# Patient Record
Sex: Male | Born: 1980
Health system: Southern US, Community
[De-identification: ages and names within clinical notes are randomized; demographics above are authoritative.]

## PROBLEM LIST (undated history)

## (undated) DIAGNOSIS — F1010 Alcohol abuse, uncomplicated: Secondary | ICD-10-CM

## (undated) DIAGNOSIS — F329 Major depressive disorder, single episode, unspecified: Secondary | ICD-10-CM

## (undated) DIAGNOSIS — K709 Alcoholic liver disease, unspecified: Secondary | ICD-10-CM

## (undated) DIAGNOSIS — K760 Fatty (change of) liver, not elsewhere classified: Secondary | ICD-10-CM

## (undated) DIAGNOSIS — R569 Unspecified convulsions: Secondary | ICD-10-CM

## (undated) DIAGNOSIS — F1910 Other psychoactive substance abuse, uncomplicated: Secondary | ICD-10-CM

## (undated) DIAGNOSIS — F32A Depression, unspecified: Secondary | ICD-10-CM

## (undated) DIAGNOSIS — K860 Alcohol-induced chronic pancreatitis: Secondary | ICD-10-CM

## (undated) DIAGNOSIS — E876 Hypokalemia: Secondary | ICD-10-CM

## (undated) DIAGNOSIS — F909 Attention-deficit hyperactivity disorder, unspecified type: Secondary | ICD-10-CM

## (undated) DIAGNOSIS — F419 Anxiety disorder, unspecified: Secondary | ICD-10-CM

## (undated) DIAGNOSIS — D649 Anemia, unspecified: Secondary | ICD-10-CM

## (undated) DIAGNOSIS — E871 Hypo-osmolality and hyponatremia: Secondary | ICD-10-CM

## (undated) DIAGNOSIS — G5620 Lesion of ulnar nerve, unspecified upper limb: Secondary | ICD-10-CM

## (undated) HISTORY — DX: Alcohol abuse, uncomplicated: F10.10

## (undated) HISTORY — DX: Anxiety disorder, unspecified: F41.9

## (undated) HISTORY — DX: Hypo-osmolality and hyponatremia: E87.1

## (undated) HISTORY — PX: WRIST SURGERY: SHX841

## (undated) HISTORY — DX: Unspecified convulsions: R56.9

## (undated) HISTORY — DX: Fatty (change of) liver, not elsewhere classified: K76.0

## (undated) HISTORY — DX: Attention-deficit hyperactivity disorder, unspecified type: F90.9

## (undated) HISTORY — DX: Alcohol-induced chronic pancreatitis: K86.0

## (undated) HISTORY — DX: Lesion of ulnar nerve, unspecified upper limb: G56.20

## (undated) HISTORY — DX: Alcoholic liver disease, unspecified: K70.9

## (undated) HISTORY — DX: Hypokalemia: E87.6

## (undated) HISTORY — DX: Other psychoactive substance abuse, uncomplicated: F19.10

---

## 2004-03-28 ENCOUNTER — Emergency Department (HOSPITAL_COMMUNITY): Admission: EM | Admit: 2004-03-28 | Discharge: 2004-03-28 | Payer: Self-pay | Admitting: Emergency Medicine

## 2014-10-05 ENCOUNTER — Telehealth: Payer: Self-pay | Admitting: Medical

## 2014-10-05 ENCOUNTER — Encounter: Payer: Self-pay | Admitting: Medical

## 2014-10-05 ENCOUNTER — Ambulatory Visit (INDEPENDENT_AMBULATORY_CARE_PROVIDER_SITE_OTHER): Payer: BLUE CROSS/BLUE SHIELD | Admitting: Medical

## 2014-10-05 VITALS — BP 144/85 | HR 108 | Temp 98.4°F | Ht 69.0 in | Wt 147.0 lb

## 2014-10-05 DIAGNOSIS — R55 Syncope and collapse: Secondary | ICD-10-CM

## 2014-10-05 DIAGNOSIS — F411 Generalized anxiety disorder: Secondary | ICD-10-CM | POA: Diagnosis not present

## 2014-10-05 DIAGNOSIS — R569 Unspecified convulsions: Secondary | ICD-10-CM | POA: Diagnosis not present

## 2014-10-05 NOTE — Progress Notes (Signed)
Subjective:    Patient ID: Tony Jennings, male    DOB: [birth year], 34 y.o.   MRN: [id number]  HPI  I have reviewed pt PMH, PSH, FH, Social History and Surgical History  Anxiety- Pt states he has dealt with anxiety since 34 yo. He states he tried medications in the past but he felt like it did not help. Worse when he was younger.then seemed to decrease for a while. But then recently with job lay off in September 9th, move to new city and new job the anxiety is now worse. When younger he states he would have panic attacks. Now he states he occasionally gets panic attacks.  Moderate to severe anxiety  for past months. 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 Pt states his boss makes him very stressed. Pt tried paxil in past. But others meds he can't remember. Then Pt states  remembers taking effexor and he did not like way it made him feel.  Each night he is recenlty drinking beer to help with anwxety. 1-5 beers most night. But occasional none.  Pt states no other type meds that he took. In general he states he does like to take medications. No benzos before.  Pt also states 2 months ago he had a seizure on the couch. Pt had eeg, mri and maybe CT scan. All those came back negative. Pt never followed up with neurologist after hospitalization. No hx of seizure before.  Pt states syncope last week. He ws just walking and passed out just outside his house. He does not know how long he was out. He was not evaluated that day. No reccurence. This never happened before. Not more stressed than normal. But super anxious. No chest pain, no shortness of breath or ha before this event.     Review of Systems  Constitutional: Negative for fever, chills and fatigue.  Respiratory: Negative for cough, chest tightness, shortness of breath and wheezing.   Cardiovascular: Negative for chest pain and palpitations.  Musculoskeletal: Negative for back pain.  Neurological: Positive  for seizures and syncope. Negative for dizziness, tremors, speech difficulty, weakness, light-headedness, numbness and headaches.       None + recently this refers to pmh. Seizure 2 months ago. Syncope wk ago.   Past Medical History  Diagnosis Date  . Seizures   . Anxiety     History   Social History  . Marital Status: Single    Spouse Name: N/A  . Number of Children: N/A  . Years of Education: N/A   Occupational History  . Not on file.   Social History Main Topics  . Smoking status: Current Some Day Smoker -- 3.00 packs/day  . Smokeless tobacco: Never Used  . Alcohol Use: 0.0 oz/week    0 Standard drinks or equivalent per week     Comment: Drank heavily for years in past.  Recenlty drinking more with anxiety.   . Drug Use: No     Comment: prior use marijuana, snorted ritalin, rare cocain. (But this was when 34 yo)  . Sexual Activity: Yes   Other Topics Concern  . Not on file   Social History Narrative  . No narrative on file    Past Surgical History  Procedure Laterality Date  . Wrist surgery      Family History  Problem Relation Age of Onset  . Hypertension Mother   . Hypertension Father     No Known Allergies  No current outpatient prescriptions on file prior to visit.   No current facility-administered medications on file prior to visit.    BP 144/85 mmHg  Pulse 108  Temp(Src) 98.4 F (36.9 C) (Oral)  Ht  (1.753 m)  Wt 147 lb (66.679 kg)  BMI 21.70 kg/m2  SpO2 100%      Objective:   Physical Exam  General Mental Status- Alert. General Appearance- Not in acute distress. But he appears very nervous shaky and paces during interview. He was polite/pleasant despite that he felt anxious  Skin General: Color- Normal Color. Moisture- Normal Moisture.  Neck Carotid Arteries- Normal color. Moisture- Normal Moisture. No carotid bruits. No JVD.  Chest and Lung Exam Auscultation: Breath Sounds:-Normal. Even and  unlabored.  Cardiovascular Auscultation:Rythm- RRR Murmurs & Other Heart Sounds:Auscultation of the heart reveals- No Murmurs.  Abdomen Inspection:-Inspeection Normal. Palpation/Percussion:Note:No mass. Palpation and Percussion of the abdomen reveal- Non Tender, Non Distended + BS, no rebound or guarding.    Neurologic Cranial Nerve exam:- CN III-XII intact(No nystagmus), symmetric smile.  Finger to Nose:- Normal/Intact Strength:- 5/5 equal and symmetric strength both upper and lower extremities.      Assessment & Plan:

## 2014-10-05 NOTE — Progress Notes (Signed)
Pre visit review using our clinic review tool, if applicable. No additional management support is needed unless otherwise documented below in the visit note. 

## 2014-10-05 NOTE — Assessment & Plan Note (Signed)
This occurred one time 2 months ago. You did not follow up since [place] in [place]. I wil refer you to local neurologist for follow up. Please sign release of info forms so we can get old records.

## 2014-10-05 NOTE — Assessment & Plan Note (Signed)
With your severe anxiety I do want to offer you some level of relief. You are hesitant to take any medication. The med that I think will help with anxiety are hydroxyzine and sertraline. You want to investigate/research both. Please let me know if you are willing to try one or both.  I am giving you card of our Counselor. Please call this number and set up an appointment.

## 2014-10-05 NOTE — Telephone Encounter (Signed)
emmi mailed  °

## 2014-10-05 NOTE — Patient Instructions (Signed)
Generalized anxiety disorder With your severe anxiety I do want to offer you some level of relief. You are hesitant to take any medication. The med that I think will help with anxiety are hydroxyzine and sertraline. You want to investigate/research both. Please let me know if you are willing to try one or both.  I am giving you card of our Counselor. Please call this number and set up an appointment.   Syncope With your hx of syncope one week ago. We did ekg today. I also want to refer you to cardiologist and neurologist to evaluate potential causes.   If this were to occur again then ED evaluation that same day.  Also I wanted to get some baseline labs today but your time/work schedule did not permit. We can get these labs on follow up.   Seizures This occurred one time 2 months ago. You did not follow up since you are in [place]. I wil refer you to local neurologist for follow up. Please sign release of info forms so we can get old records.     Follow up in 2 weeks or as needed.

## 2014-10-05 NOTE — Assessment & Plan Note (Addendum)
With your hx of syncope one week ago. We did ekg today. I also want to refer you to cardiologist and neurologist to evaluate potential causes.   If this were to occur again then ED evaluation that same day.  Also I wanted to get some baseline labs today but your time/work schedule did not permit. We can get these labs on follow up.  Note ekg showed mild tachycardia. This I think related to his visible anxiety. Some pac as well. Advised decrease smokiing and cafffeine intake.

## 2014-10-07 ENCOUNTER — Telehealth: Payer: Self-pay | Admitting: Medical

## 2014-10-07 NOTE — Telephone Encounter (Signed)
Error:[id number] ° °

## 2014-10-12 ENCOUNTER — Ambulatory Visit: Payer: BLUE CROSS/BLUE SHIELD | Admitting: Cardiology

## 2014-10-19 ENCOUNTER — Encounter: Payer: Self-pay | Admitting: Neurology

## 2014-10-19 ENCOUNTER — Ambulatory Visit (INDEPENDENT_AMBULATORY_CARE_PROVIDER_SITE_OTHER): Payer: BLUE CROSS/BLUE SHIELD | Admitting: Neurology

## 2014-10-19 VITALS — BP 132/100 | HR 110 | Resp 18 | Ht 68.0 in | Wt 148.0 lb

## 2014-10-19 DIAGNOSIS — R55 Syncope and collapse: Secondary | ICD-10-CM

## 2014-10-19 DIAGNOSIS — R569 Unspecified convulsions: Secondary | ICD-10-CM

## 2014-10-19 DIAGNOSIS — G5622 Lesion of ulnar nerve, left upper limb: Secondary | ICD-10-CM | POA: Diagnosis not present

## 2014-10-19 DIAGNOSIS — F411 Generalized anxiety disorder: Secondary | ICD-10-CM

## 2014-10-19 NOTE — Patient Instructions (Signed)
1. Schedule 24-hour EEG 2. We will obtain records from your hospital stay 3. The medications we are considering are Lamictal or Depakote 4. As per [place] driving laws, after any episode of loss of consciousness, one should not drive until 6 months event-free 5. Wear an elbow splint on the left elbow, avoid pressing on the nerve 6. Follow-up in 2 weeks

## 2014-10-19 NOTE — Progress Notes (Signed)
NEUROLOGY CONSULTATION NOTE  Tony Jennings MRN: [id number] DOB: [birth year]  Referring provider: Esperanza RichtersEdward Saguier, PA-C Primary care provider: Esperanza RichtersEdward Saguier, PA-C  Reason for consult:  seizure  Thank you for your kind referral of Tony Jennings for consultation of the above symptoms. Although his history is well known to you, please allow me to reiterate it for the purpose of our medical record. Records and images were personally reviewed where available.  HISTORY OF PRESENT ILLNESS: This is a 34 year old left-handed man with a history of anxiety presenting for new onset seizure last January 2016. He was dozing on the couch and does not recall much except for flashes of memory/snapshots of the event. Apparently his girlfriend witnessed a generalized convulsion. No tongue bite or incontinence. He was weak after, feeling confused and cloudy. His girlfriend drove him to [facility] where he reports MRI and EEG done were normal. He was discharged home on Keppra, however he was [place] at that time and lost his prescription. On 09/29/14, he was out for a walk then had an out of body sensation as if he was seeing himself from above. He knew what was happening but could not control, feeling himself fall, then everything went black. He had scrapes over his lip and right hand, and woke up on the ground. He is unsure how long he was out. He recall having bad anxiety at that time. He denied any olfactory/gustatory hallucinations, focal numbness/tingling/weakness, no tongue bite/incontinence. He denies any clear triggers prior to the events, sleep is fine, he denie alcohol intake. He has significant anxiety, which has ramped back up in the past few years. He is anxious in the office today, and reports that he is jittery and cannot sit still, pacing helps.  He has been told by his girlfriend that there are times she can see she is losing him (?staring), he is unaware he is doing this.  He reports body jerks every now and then. He recalls having the out of body sensation almost every night for a week after the episode last month. He has palpitations with his anxiety. He denies any headaches, dizziness, diplopia, neck/back pain, bowel/bladder dysfunction. He has constant tingling down his left hand in an ulnar distribution, he cannot feel his pinky finger when typing. He denies any elbow pain.   Epilepsy Risk Factors: He had a normal birth and early development.  There is no history of febrile convulsions, CNS infections such as meningitis/encephalitis, significant traumatic brain injury, neurosurgical procedures, or family history of seizures. He fell off a stage 10-11 years ago and hit his head with brief loss of consciousness.   PAST MEDICAL HISTORY: Past Medical History  Diagnosis Date  . Seizures   . Anxiety     PAST SURGICAL HISTORY: Past Surgical History  Procedure Laterality Date  . Wrist surgery      MEDICATIONS: No current outpatient prescriptions on file prior to visit.   No current facility-administered medications on file prior to visit.    ALLERGIES: No Known Allergies  FAMILY HISTORY: Family History  Problem Relation Age of Onset  . Hypertension Mother   . Hypertension Father     SOCIAL HISTORY: History   Social History  . Marital Status: Divorced    Spouse Name: N/A  . Number of Children: N/A  . 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 Smoking status: Current Every  Day Smoker  . Smokeless tobacco: Never Used  . Alcohol Use: 0.0 oz/week    0 Standard drinks or equivalent per week     Comment: Drank heavily for years in past.  Recenlty drinking more with anxiety.   . Drug Use: No     Comment: prior use marijuana, snorted ritalin, rare cocain. (But this was when 34 yo)  . Sexual Activity: Yes   Other Topics Concern  . Not on file   Social History Narrative    REVIEW OF  SYSTEMS: Constitutional: No fevers, chills, or sweats, no generalized fatigue, change in appetite Eyes: No visual changes, double vision, eye pain Ear, nose and throat: No hearing loss, ear pain, nasal congestion, sore throat Cardiovascular: No chest pain, palpitations Respiratory:  No shortness of breath at rest or with exertion, wheezes GastrointestinaI: No nausea, vomiting, diarrhea, abdominal pain, fecal incontinence Genitourinary:  No dysuria, urinary retention or frequency Musculoskeletal:  No neck pain, back pain Integumentary: No rash, pruritus, skin lesions Neurological: as above Psychiatric: No depression, insomnia, anxiety Endocrine: No palpitations, fatigue, diaphoresis, mood swings, change in appetite, change in weight, increased thirst Hematologic/Lymphatic:  No anemia, purpura, petechiae. Allergic/Immunologic: no itchy/runny eyes, nasal congestion, recent allergic reactions, rashes  PHYSICAL EXAM: Filed Vitals:   10/19/14 1246  BP: 132/100  Pulse: 110  Resp: 18   General: No acute distress Head:  Normocephalic/atraumatic Eyes: Fundoscopic exam shows bilateral sharp discs, no vessel changes, exudates, or hemorrhages Neck: supple, no paraspinal tenderness, full range of motion Back: No paraspinal tenderness Heart: regular rate and rhythm Lungs: Clear to auscultation bilaterally. Vascular: No carotid bruits. Skin/Extremities: No rash, no edema Neurological Exam: Mental status: alert and oriented to person, place, and time, no dysarthria or aphasia, Fund of knowledge is appropriate.  Recent and remote memory are intact.  Attention and concentration are normal.    Able to name objects and repeat phrases. Cranial nerves: CN I: not tested CN II: pupils equal, round and reactive to light, visual fields intact, fundi unremarkable. CN III, IV, VI:  full range of motion, no nystagmus, no ptosis CN V: facial sensation intact CN VII: upper and lower face symmetric CN VIII:  hearing intact to finger rub CN IX, X: gag intact, uvula midline CN XI: sternocleidomastoid and trapezius muscles intact CN XII: tongue midline Bulk & Tone: normal, no fasciculations. Motor: 5/5 throughout with no pronator drift. Sensation: intact to light touch, cold, pin, vibration and joint position sense.  No extinction to double simultaneous stimulation.  Romberg test negative Deep Tendon Reflexes: +2 throughout, no ankle clonus Plantar responses: downgoing bilaterally Cerebellar: no incoordination on finger to nose, heel to shin. No dysdiadochokinesia Gait: narrow-based and steady, able to tandem walk adequately. Tremor: none +Tinel's sign at the left elbow  IMPRESSION: This is a 34 year old left-handed man with a history of anxiety, presenting with new onset convulsion last January 2016, then an unwitnessed episode of loss of consciousness last 09/29/14 preceded by an out of body sensation. Symptoms concerning for partial seizures that secondarily generalize. He reports MRI brain and EEG done at [facility] were normal, then he was started on Keppra. Records have been requested for review. We discussed that after an initial seizure, unless there are significant risk factors, an abnormal neurological exam, an EEG showing epileptiform abnormalities, and/or abnormal neuroimaging, treatment with an antiepileptic drug is not indicated. We discussed 10% of the population may have a single seizure. Patients with a single unprovoked seizure have a recurrence rate  of 33% after a single seizure and 73% after a second seizure. In his case, I am concerned about the second event occurring last March. He is very hesitant to start medication. A 24-hour EEG will be ordered to further classify his symptoms. He understands seizure recurrence risks, and is aware of [place] driving laws to stop driving after an episode of loss of consciousness, until 6 months event-free. I discussed with him medication options, including  Lamictal and Depakote, which may help with mood as well. He would like to do his own research and discuss in a follow-up in 2 weeks. He will start wearing an elbow pad for left ulnar neuropathy.  Thank you for allowing me to participate in the care of this patient. Please do not hesitate to call for any questions or concerns.   [name], M.D.

## 2014-10-24 ENCOUNTER — Encounter: Payer: Self-pay | Admitting: Neurology

## 2014-10-24 DIAGNOSIS — R55 Syncope and collapse: Secondary | ICD-10-CM | POA: Insufficient documentation

## 2014-10-24 DIAGNOSIS — G5620 Lesion of ulnar nerve, unspecified upper limb: Secondary | ICD-10-CM

## 2014-10-24 DIAGNOSIS — R569 Unspecified convulsions: Secondary | ICD-10-CM | POA: Insufficient documentation

## 2014-10-24 HISTORY — DX: Lesion of ulnar nerve, unspecified upper limb: G56.20

## 2014-10-25 ENCOUNTER — Telehealth: Payer: Self-pay | Admitting: Family Medicine

## 2014-10-25 NOTE — Telephone Encounter (Signed)
Lmovm to return my call. 

## 2014-10-25 NOTE — Telephone Encounter (Signed)
-----   Message from Van ClinesKaren M Aquino, MD sent at 10/25/2014 10:25 AM EDT ----- Regarding: pls call pt Pls let him know I received and reviewed records from [facility]. The MRI brain and routine EEG were normal. Thanks

## 2014-10-26 NOTE — Telephone Encounter (Signed)
Lmovm to return my call. 

## 2014-10-27 ENCOUNTER — Encounter: Payer: Self-pay | Admitting: Family Medicine

## 2014-10-27 NOTE — Telephone Encounter (Signed)
Result letter mailed to patient after no call back with 2 messages left.

## 2014-11-04 ENCOUNTER — Encounter: Payer: Self-pay | Admitting: Cardiology

## 2014-11-14 ENCOUNTER — Encounter: Payer: Self-pay | Admitting: Neurology

## 2014-11-14 ENCOUNTER — Other Ambulatory Visit: Payer: BLUE CROSS/BLUE SHIELD | Admitting: Neurology

## 2014-11-14 DIAGNOSIS — Z029 Encounter for administrative examinations, unspecified: Secondary | ICD-10-CM

## 2014-11-21 ENCOUNTER — Encounter: Payer: Self-pay | Admitting: Neurology

## 2014-11-21 ENCOUNTER — Ambulatory Visit (INDEPENDENT_AMBULATORY_CARE_PROVIDER_SITE_OTHER): Payer: BLUE CROSS/BLUE SHIELD | Admitting: Neurology

## 2014-11-21 VITALS — BP 118/64 | HR 115 | Resp 18 | Ht 68.0 in | Wt 145.0 lb

## 2014-11-21 DIAGNOSIS — R569 Unspecified convulsions: Secondary | ICD-10-CM

## 2014-11-21 DIAGNOSIS — G5622 Lesion of ulnar nerve, left upper limb: Secondary | ICD-10-CM | POA: Diagnosis not present

## 2014-11-21 DIAGNOSIS — R55 Syncope and collapse: Secondary | ICD-10-CM | POA: Diagnosis not present

## 2014-11-21 DIAGNOSIS — F411 Generalized anxiety disorder: Secondary | ICD-10-CM

## 2014-11-21 NOTE — Progress Notes (Signed)
NEUROLOGY FOLLOW UP OFFICE NOTE  Tony Jennings [id number]  HISTORY OF PRESENT ILLNESS: I had the pleasure of seeing Tony Jennings in follow-up in the neurology clinic on 11/20/2014.  The patient was last seen a month ago for new onset seizure in January 2016, and an episode of loss of consciousness in March 2016 preceded by an out of body sensation. I had discussed starting anti-epileptic medication on his last visit, however he was hesitant and wanted to proceed with 24-hour EEG first to further classify his seizures. I reviewed MRI brain and EEG results from [facility], which were normal. He denies any further episodes of loss of consciousness or out of body experience. He denies any olfactory/gustatory hallucinations, rising epigastric sensation, myoclonic jerks. He has significant anxiety, states that it is at a "5 and not at an 11" but has not seen Behavioral Medicine yet. He was also noted to have ulnar neuropathy on his initial visit. He is wearing an elastic brace on his left elbow, but feels that his symptoms are worsening. He has a hard time typing at work and cannot play his guitar. He has weakness and numbness on his left 5th digit. He feels his 4th digit is better.   HPI: This is a 34 yo LH man with a history of anxiety presenting for new onset seizure last January 2016. He was dozing on the couch and does not recall much except for flashes of memory/snapshots of the event. Apparently his girlfriend witnessed a generalized convulsion. No tongue bite or incontinence. He was weak after, feeling confused and cloudy. His girlfriend drove him to [facility] where he reports MRI and EEG done were normal. He was discharged home on Keppra, however he was moving from [place] to [place] at that time and lost his prescription. On 09/29/14, he was out for a walk then had an out of body sensation as if he was seeing himself from above. He knew what was happening but could not control, feeling himself fall,  then everything went black. He had scrapes over his lip and right hand, and woke up on the ground. He is unsure how long he was out. He recall having bad anxiety at that time. He denied any olfactory/gustatory hallucinations, focal numbness/tingling/weakness, no tongue bite/incontinence. He denies any clear triggers prior to the events, sleep is fine, he denie alcohol intake. He has significant anxiety, which has ramped back up in the past few years.  He has been told by his girlfriend that there are times she can see she is losing him (?staring), he is unaware he is doing this. He reports body jerks every now and then. He recalls having the out of body sensation almost every night for a week after the episode last month. He has palpitations with his anxiety. He denies any headaches, dizziness, diplopia, neck/back pain, bowel/bladder dysfunction. He has constant tingling down his left hand in an ulnar distribution, he cannot feel his pinky finger when typing. He denies any elbow pain.   Epilepsy Risk Factors: He had a normal birth and early development. There is no history of febrile convulsions, CNS infections such as meningitis/encephalitis, significant traumatic brain injury, neurosurgical procedures, or family history of seizures. He fell off a stage 10-11 years ago and hit his head with brief loss of consciousness.   Diagnostic Data: MRI brain and routine EEG at [facility] reported as normal.   PAST MEDICAL HISTORY: Past Medical History  Diagnosis Date  . Seizures   . Anxiety   .  Ulnar neuropathy at elbow 10/24/2014    MEDICATIONS: No current outpatient prescriptions on file prior to visit.   No current facility-administered medications on file prior to visit.    ALLERGIES: No Known Allergies  FAMILY HISTORY: Family History  Problem Relation Age of Onset  . Hypertension Mother   . Hypertension Father     SOCIAL HISTORY: History   Social History  . Marital Status: Divorced     Spouse Name: N/A  . Number of Children: N/A  . Years of Education: N/A   Occupational History  . Risk analyst    Social History Main Topics  . Smoking status: Current Every Day Smoker  . Smokeless tobacco: Never Used  . Alcohol Use: 0.0 oz/week    0 Standard drinks or equivalent per week     Comment: Drank heavily for years in past.  Recenlty drinking more with anxiety.   . Drug Use: No     Comment: prior use marijuana, snorted ritalin, rare cocain. (But this was when 34 yo)  . Sexual Activity: Yes   Other Topics Concern  . Not on file   Social History Narrative    REVIEW OF SYSTEMS: Constitutional: No fevers, chills, or sweats, no generalized fatigue, change in appetite Eyes: No visual changes, double vision, eye pain Ear, nose and throat: No hearing loss, ear pain, nasal congestion, sore throat Cardiovascular: No chest pain, palpitations Respiratory:  No shortness of breath at rest or with exertion, wheezes GastrointestinaI: No nausea, vomiting, diarrhea, abdominal pain, fecal incontinence Genitourinary:  No dysuria, urinary retention or frequency Musculoskeletal:  No neck pain, back pain Integumentary: No rash, pruritus, skin lesions Neurological: as above Psychiatric: No depression, insomnia,++ anxiety Endocrine: No palpitations, fatigue, diaphoresis, mood swings, change in appetite, change in weight, increased thirst Hematologic/Lymphatic:  No anemia, purpura, petechiae. Allergic/Immunologic: no itchy/runny eyes, nasal congestion, recent allergic reactions, rashes  PHYSICAL EXAM: Filed Vitals:   11/21/14 0850  BP: 118/64  Pulse: 115  Resp: 18   General: No acute distress Head:  Normocephalic/atraumatic Neck: supple, no paraspinal tenderness, full range of motion Heart:  Regular rate and rhythm Lungs:  Clear to auscultation bilaterally Back: No paraspinal tenderness Skin/Extremities: No rash, no edema Neurological Exam: alert and oriented to person,  place, and time. No aphasia or dysarthria. Fund of knowledge is appropriate.  Recent and remote memory are intact.  Attention and concentration are normal.    Able to name objects and repeat phrases. Cranial nerves: Pupils equal, round, reactive to light.  Fundoscopic exam unremarkable, no papilledema. Extraocular movements intact with no nystagmus. Visual fields full. Facial sensation intact. No facial asymmetry. Tongue, uvula, palate midline.  Motor: Bulk and tone normal, muscle strength 5/5 throughout except for 3+/5 weakness of left abductor digit minimi, no pronator drift.  Sensation decreased on left 4th and 5th digits. No extinction to double simultaneous stimulation.  Deep tendon reflexes 2+ throughout, toes downgoing.  Finger to nose testing intact.  Gait narrow-based and steady, able to tandem walk adequately.  Romberg negative. +Tinel sign at left elbow.  IMPRESSION: This is a 34 yo LH man with a history of anxiety, with new onset convulsion last January 2016, then an unwitnessed episode of loss of consciousness last 09/29/14 preceded by an out of body sensation. Symptoms concerning for partial seizures that secondarily generalize. MRI brain and routine EEG at [facility] were normal. He did not start Keppra and is hesitant to start AEDs. He would like to proceed with 24-hour EEG first. His  main concern today is worsening left ulnar neuropathy. He will be referred for PT, and will use a different elbow brace. He will be scheduled for an EMG/NCV of the left UE, if significant changes seen, he may benefit from surgery. He will continue avoiding pressure in the area. He is aware of [place] driving laws to stop driving after an episode of loss of consciousness, until 6 months event-free. He will follow-up after the tests.   Thank you for allowing me to participate in his care.  Please do not hesitate to call for any questions or concerns.  The duration of this appointment visit was 25 minutes of face-to-face time  with the patient.  Greater than 50% of this time was spent in counseling, explanation of diagnosis, planning of further management, and coordination of care.   Patrcia DollyKaren Aquino, M.D.

## 2014-11-21 NOTE — Patient Instructions (Signed)
1. Refer to Physical Therapy in [place] for left ulnar neuropathy 2. Schedule EMG/NCV left UE with Dr. Allena KatzPatel 3. Proceed with 24-hour EEG as planned 4. Continue avoiding pressure on left elbow 5. As per [place] driving laws, after an episode of loss of consciousness, one should not drive until 6 months event-free

## 2014-11-24 ENCOUNTER — Telehealth: Payer: Self-pay | Admitting: Neurology

## 2014-11-24 NOTE — Telephone Encounter (Signed)
Called to set up EMG appt. He stated he thought Dr. Karel JarvisAquino wanted Physical Therapy first I looked at her note and told him there was no priority in her orders. I told him it would be perfectly reasonable to wait or proceed with testing. He decided to set up appointment then he will call to change it necessary after he sees the physical therapist. He asked where he is going to physical therapy I looked at his referral and told him.

## 2014-12-06 ENCOUNTER — Ambulatory Visit (INDEPENDENT_AMBULATORY_CARE_PROVIDER_SITE_OTHER): Payer: BLUE CROSS/BLUE SHIELD | Admitting: Neurology

## 2014-12-06 DIAGNOSIS — G5622 Lesion of ulnar nerve, left upper limb: Secondary | ICD-10-CM

## 2014-12-06 DIAGNOSIS — G610 Guillain-Barre syndrome: Secondary | ICD-10-CM

## 2014-12-06 NOTE — Procedures (Signed)
[facility]  [address] [place], [address]  [place], [place] [address] Tel: [phone] Fax:  [phone] Test Date:  12/06/2014  Patient: Tony Jennings DOB: 08/06/1980 Physician: Nita Sickle  Sex: Male Height:  Ref Phys: [name]  ID#: [id number] Temp: 33.0C Technician: Judie Petit. Dean   Patient Complaints: This is a 34 year old gentleman presenting for evaluation of progressively worsening left hand weakness and paresthesias for the past 53-months.    NCV & EMG Findings: Extensive electrodiagnostic testing of the left upper extremity and additional studies of the right shows:  1. Left median sensory response is prolonged (3.8 ms) with preserved amplitude. Left ulnar sensory response is absent. Left radial sensory responses within normal limits. On the right upper extremity, the median and ulnar sensory responses are within normal limits. 2. Left median motor response shows prolonged latency (4.2 ms) and reduced amplitude (4.6 mV). The left ulnar motor response shows severely reduced amplitude (2.7 mV), decreased conduction velocity (B Elbow-Wrist, 44 m/s), and decreased conduction velocity (A Elbow-B Elbow, 21 m/s) along with evidence of conduction block. Left radial motor response is within normal limits. The right side, the ulnar motor response shows conduction velocity slowing across the elbow (A Elbow-B Elbow, 48 m/s), with preserved amplitude and latency. The right median motor response is reduced in amplitude (4.7 mV) and decreased conduction velocity (Elbow-Wrist, 44 m/s); additionally, there is anomalous innervation to the abductor pollicis brevis as evidenced by a motor response when stimulating at the ulnar nerve (wrist), consistent with a Martin-Gruber anastomosis, a normal variant. 3. Chronic motor axon loss changes are seen affecting the left first dorsal interosseous, abductor pollicis brevis, abductor digiti minimi, and flexor digitorum profundus 4 & 5, with active  denervation restricted to the ulnar innervated muscles in the hand. There is sparse chronic motor axon loss changes affecting the right first dorsal interosseous muscle, without accompanied active denervation.  Impression: 1. 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  If clinically indicated, electrodiagnostic testing of the lower extremities can be performed to assess degree of involvement. 2. Incidentally, there is a right Martin-Gruber anastomosis, a normal variant.    ___________________________ Nita Sickle    Nerve Conduction Studies Anti Sensory Summary Table   Stim Site NR Peak (ms) Norm Peak (ms) P-T Amp (V) Norm P-T Amp  Left Median Anti Sensory (2nd Digit)  Wrist    3.8 <3.4 40.6 >20  Right Median Anti Sensory (2nd Digit)  Wrist    3.1 <3.4 42.8 >20  Left Radial Anti Sensory (Base 1st Digit)  Wrist    2.1 <2.7 45.6 >18  Left Ulnar Anti Sensory (5th Digit)  Wrist NR  <3.1  >12  Right Ulnar Anti Sensory (5th Digit)  Wrist    2.9 <3.1 46.2 >12   Motor Summary Table   Stim Site NR Onset (ms) Norm Onset (ms) O-P Amp (mV) Norm O-P Amp Site1 Site2 Delta-0 (ms) Dist (cm) Vel (m/s) Norm Vel (m/s)  Left Median Motor (Abd Poll Brev)  Wrist    4.2 <3.9 4.6 >6 Elbow Wrist 4.1 25.0 61 >50  Elbow    8.3  3.2         Right Median Motor (Abd Poll Brev)  Wrist    3.7 <3.9 4.7 >6 Elbow Wrist 5.5 24.0 44 >50  Elbow    9.2  7.8  Ulnar-wrist cross-over Elbow 5.0  0.0    Ulnar-wrist cross-over    4.2  8.5         Left Radial Motor (Ext Ind Prop)  7cm    1.4 <3.1 8.4 >6 Up Arm 7cm 2.3 12.0 52 >50  Up Arm    3.7  8.9  Axilla Up Arm 1.0 5.0 50   Axilla    4.7  8.8         Left Ulnar Motor (Abd Dig Minimi)  Wrist    2.3 <3.1 2.7 >7 B Elbow Wrist 5.2 23.0 44 >50  B Elbow    7.5  1.4  A Elbow B Elbow 4.8 10.0 21 >50  A Elbow     12.3  0.8         Right Ulnar Motor (Abd Dig Minimi)  Wrist    2.5 <3.1 10.9 >7 B Elbow Wrist 3.4 23.0 68 >50  B Elbow    5.9  10.4  A Elbow B Elbow 2.1 10.0 48 >50  A Elbow    8.0  10.1          F Wave Studies   NR F-Lat (ms) Lat Norm (ms) L-R F-Lat (ms)  Left Ulnar (Mrkrs) (Abd Dig Min)     30.46 <33 2.49  Right Ulnar (Mrkrs) (Abd Dig Min)     27.96 <33 2.49   EMG   Side Muscle Ins Act Fibs Psw Fasc Number Recrt Dur Dur. Amp Amp. Poly Poly. Comment  Left PronatorTeres Nml Nml Nml Nml Nml Nml Nml Nml Nml Nml Nml Nml N/A  Left 1stDorInt Nml 1+ Nml Nml 1- Rapid Nml Nml Nml Nml Nml Nml N/A  Left ABD Dig Min Nml 2+ Nml Nml SMU Rapid Nml Nml Nml Nml Nml Nml N/A  Left FlexDigProf 4,5 Nml Nml Nml Nml Nml Nml Nml Nml Nml Nml Nml Nml N/A  Left Abd Poll Brev Nml Nml Nml Nml 2- Mod-R Few 1+ Few 1+ Nml Nml N/A  Left FlexPolLong Nml Nml Nml Nml Nml Nml Nml Nml Nml Nml Nml Nml N/A  Left Ext Indicis Nml Nml Nml Nml Nml Nml Nml Nml Nml Nml Nml Nml N/A  Left Biceps Nml Nml Nml Nml Nml Nml Nml Nml Nml Nml Nml Nml N/A  Left Triceps Nml Nml Nml Nml Nml Nml Nml Nml Nml Nml Nml Nml N/A  Right 1stDorInt Nml Nml Nml Nml 1- Mod-R Few 1+ Nml Nml Nml Nml N/A  Right Abd Poll Brev Nml Nml Nml Nml Nml Nml Nml Nml Nml Nml Nml Nml N/A  Right ABD Dig Min Nml Nml Nml Nml Nml Nml Nml Nml Nml Nml Nml Nml N/A   Waveforms:

## 2014-12-07 ENCOUNTER — Ambulatory Visit: Payer: BLUE CROSS/BLUE SHIELD | Admitting: Physical Therapy

## 2014-12-08 ENCOUNTER — Telehealth: Payer: Self-pay | Admitting: Family Medicine

## 2014-12-08 ENCOUNTER — Ambulatory Visit: Payer: BLUE CROSS/BLUE SHIELD | Admitting: Medical

## 2014-12-08 DIAGNOSIS — Z0289 Encounter for other administrative examinations: Secondary | ICD-10-CM

## 2014-12-08 NOTE — Telephone Encounter (Signed)
Lmom to return my call. 

## 2014-12-08 NOTE — Telephone Encounter (Signed)
-----   Message from Van ClinesKaren M Aquino, MD sent at 12/08/2014 12:21 PM EDT ----- Regarding: pls schedule f/u Can you pls let him know I reviewed nerve test, would like to discuss results with him in the office, I have openings tomorrow and Monday, whichever day he can come in. Thanks!

## 2014-12-13 ENCOUNTER — Encounter: Payer: Self-pay | Admitting: Medical

## 2014-12-13 ENCOUNTER — Telehealth: Payer: Self-pay | Admitting: Medical

## 2014-12-13 NOTE — Telephone Encounter (Signed)
Called patient again and left msg to return my call. I spoke with patient on Thursday about an appt for either Friday or Monday. He said he would check his schedule & give me call back to let me know what worked best for him.

## 2014-12-13 NOTE — Telephone Encounter (Signed)
Pt was no show for appt on 12/08/14- letter sent. Charge? °

## 2014-12-13 NOTE — Telephone Encounter (Signed)
charge 

## 2014-12-14 ENCOUNTER — Ambulatory Visit (INDEPENDENT_AMBULATORY_CARE_PROVIDER_SITE_OTHER): Payer: BLUE CROSS/BLUE SHIELD | Admitting: Physical Therapy

## 2014-12-14 ENCOUNTER — Encounter: Payer: Self-pay | Admitting: Physical Therapy

## 2014-12-14 DIAGNOSIS — M79642 Pain in left hand: Secondary | ICD-10-CM

## 2014-12-14 DIAGNOSIS — R29898 Other symptoms and signs involving the musculoskeletal system: Secondary | ICD-10-CM

## 2014-12-14 DIAGNOSIS — M6289 Other specified disorders of muscle: Secondary | ICD-10-CM | POA: Diagnosis not present

## 2014-12-14 NOTE — Patient Instructions (Signed)
Wrist Flexor Stretch       [place] [id number]   Keeping elbow straight, grasp left hand and slowly bend wrist back until stretch is felt. Hold _30___ seconds. Relax. Repeat _1___ times per set. Do _1__ sets per session. Do __4-5__ sessions per day.  Copyright  VHI. All rights reserved.   Start massaging tender area on the inside of the Lt elbow, use ice there also, 15-20 minutes a time.  Try to do this 3-5 times a day.

## 2014-12-14 NOTE — Therapy (Addendum)
[facility] [address] [place], [place], [address] Phone: [phone]   Fax:  [phone]  Physical Therapy Evaluation  Patient Details  Name: Tony Jennings MRN: [id number] Date of Birth: [birth year] Referring Provider:  Van ClinesAquino, Karen M, MD  Encounter Date: 12/14/2014      PT End of Session - 12/14/14 0740    Visit Number 1   Number of Visits 8   Date for PT Re-Evaluation 01/11/15   PT Start Time 0712   PT Stop Time 0802   PT Time Calculation (min) 50 min      Past Medical History  Diagnosis Date  . Seizures   . Anxiety   . Ulnar neuropathy at elbow 10/24/2014    Past Surgical History  Procedure Laterality Date  . Wrist surgery      There were no vitals filed for this visit.  Visit Diagnosis:  Weakness of left hand - Plan: PT plan of care cert/re-cert  Pain, hand joint, left - Plan: PT plan of care cert/re-cert      Subjective Assessment - 12/14/14 0716    Subjective About 6 months ago patient developed numbness in 4th and 5th fingers. it has gotten progressively worse.  Getting some cramping now in palm of Lt hand. He is not able to feel the strings on his guitar   Pertinent History Has been wearing an elbow brace with soft pad on ulnar nerve area to relieve pressure   Diagnostic tests nerve conduction test - pt hasn't recieved results yet.     Patient Stated Goals wants to have decreased symptoms  and play the guitar   Currently in Pain? Yes   Pain Score 4    Pain Location Hand   Pain Orientation Left  4th and 5th finger and into the palm   Pain Descriptors / Indicators Pins and needles;Numbness   Pain Type Acute pain   Pain Radiating Towards ulnar aspect of Lt forearm   Pain Onset More than a month ago   Pain Frequency Constant   Aggravating Factors  having his hand held   Pain Relieving Factors not using the 4th and 5th fingers.             [facility] PT Assessment - 12/14/14 0001     Assessment   Medical Diagnosis ulnar neuropathy   Onset Date/Surgical Date 06/14/14   Hand Dominance Left   Next MD Visit 12/21/14   Prior Therapy none   Precautions   Precautions --  sit at desk with pressure on his elbow   Required Braces or Orthoses --  elbow soft brace   Balance Screen   Has the patient fallen in the past 6 months Yes   How many times? 1  had a siezure in January of this year   Has the patient had a decrease in activity level because of a fear of falling?  Yes  not using the treadmill anymore and being less active   Is the patient reluctant to leave their home because of a fear of falling?  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   Observation/Other Assessments   Focus on Therapeutic Outcomes (FOTO)  49% limited   Sensation   Hot/Cold Impaired by gross assessment   Proprioception Appears Intact   Coordination   Gross Motor  Movements are Fluid and Coordinated Yes   Posture/Postural Control   Posture/Postural Control Postural limitations   Postural Limitations Rounded Shoulders;Forward head   ROM / Strength   AROM / PROM / Strength AROM;Strength   AROM   Overall AROM  --  Bilateral UE's WNL, cervical WNL   Strength   Overall Strength --  bilateral shoulders, elbows and wrist WNl   Strength Assessment Site Hand   Right/Left hand Right;Left   Right Hand Grip (lbs) 77   Right Hand Lateral Pinch 13 lbs   Left Hand Grip (lbs) 47   Left Hand Lateral Pinch 5 lbs   Palpation   Palpation comment hypersensitive to touch medial elbow                   [facility] Adult PT Treatment/Exercise - 12/14/14 0001    Exercises   Exercises Wrist   Wrist Exercises   Wrist Flexion --  stretching x 30 sec   Modalities   Modalities Ultrasound;Cryotherapy   Cryotherapy   Number Minutes Cryotherapy 10 Minutes    Cryotherapy Location Forearm  elbow Lt   Type of Cryotherapy Ice pack   Ultrasound   Ultrasound Location medial Lt elbow   Ultrasound Parameters 50%, 1.0 w/cm2, 3.25mhz   Ultrasound Goals Pain                PT Education - 12/14/14 0739    Education provided Yes   Education Details HEP, ice and cross friction massage.    Person(s) Educated Patient   Methods Explanation;Handout   Comprehension Returned demonstration             PT Long Term Goals - 12/14/14 0744    PT LONG TERM GOAL #1   Title I with advanced HEP   Time 4   Period Weeks   Status New   PT LONG TERM GOAL #2   Title increase grip Lt hand =/> 60#   Time 4   Period Weeks   Status New   PT LONG TERM GOAL #3   Title increase lateral grip =/> 8#   Time 4   Period Weeks   Status New   PT LONG TERM GOAL #4   Title improve FOTO =/< 32% limited   Time 4   Period Weeks   Status New   PT LONG TERM GOAL #5   Title report =/> 50% ease when playing his guitar   Time 4   Period Weeks   Status New               Plan - 12/14/14 0740    Clinical Impression Statement 34 yo male presents with Lt hand ulnar neuropathy symptoms.  The symptoms have gotten progressively worse. He is to follow up with his MD next week for the nerve conduction study. He has devleoped Lt hand weakness as well had the pain.  ROM is WNL.    Pt will benefit from skilled therapeutic intervention in order to improve on the following deficits Impaired UE functional use;Decreased strength   Rehab Potential Good   PT Frequency 2x / week   PT Duration 4 weeks   PT Treatment/Interventions Moist Heat;Ultrasound;Cryotherapy;Electrical Stimulation;Patient/family education;Neuromuscular re-education;Therapeutic exercise;Manual techniques;Passive range of motion;Taping   PT Next Visit Plan progress to hand strengthening if tolerable.    Consulted and Agree with Plan of Care Patient         Problem List Patient Active Problem  List   Diagnosis Date Noted  .  New onset seizure 10/24/2014  . Faintness 10/24/2014  . 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 Syncope 10/05/2014  . Generalized anxiety disorder 10/05/2014  . Seizures 10/05/2014    Roderic Scarce, PT 12/14/2014, 7:56 AM  [facility] [address] [place], [place], [address] Phone: [phone]   Fax:  [phone]

## 2014-12-15 ENCOUNTER — Other Ambulatory Visit: Payer: BLUE CROSS/BLUE SHIELD

## 2014-12-19 ENCOUNTER — Encounter: Payer: Self-pay | Admitting: Rehabilitative and Restorative Service Providers"

## 2014-12-19 ENCOUNTER — Ambulatory Visit (INDEPENDENT_AMBULATORY_CARE_PROVIDER_SITE_OTHER): Payer: BLUE CROSS/BLUE SHIELD | Admitting: Rehabilitative and Restorative Service Providers"

## 2014-12-19 DIAGNOSIS — M79642 Pain in left hand: Secondary | ICD-10-CM | POA: Diagnosis not present

## 2014-12-19 DIAGNOSIS — M6289 Other specified disorders of muscle: Secondary | ICD-10-CM

## 2014-12-19 DIAGNOSIS — R29898 Other symptoms and signs involving the musculoskeletal system: Secondary | ICD-10-CM

## 2014-12-19 NOTE — Patient Instructions (Addendum)
Scapula Adduction With Pectorals, Low   Stand in doorframe with palms against frame and arms at 45. Lean forward and squeeze shoulder blades. Hold _20-30__ seconds. Repeat _3__ times per session. Do 3-4___ sessions per day.  Copyright  VHI. All rights reserved.    Scapula Adduction With Pectorals, Mid-Range   Stand in doorframe with palms against frame and arms at 90. Lean forward and squeeze shoulder blades. Hold 20-30___ seconds. Repeat _3__ times per session. Do _3-4__ sessions per day. \Scapula Adduction With Pectorals, High   Stand in doorframe with palms against frame and arms at 120. Lean forward and squeeze shoulder blades. Hold _20-30__ seconds. Repeat _3__ times per session. Do 3-4___ sessions per day.    Scapular Retraction (Standing)   With arms at sides, pinch shoulder blades together. Repeat __10__ times per set. Do _1-3___ sets per session. Do several____ sessions per day.

## 2014-12-19 NOTE — Therapy (Signed)
[facility] [place] [address] [place], [place], [address] Phone: [phone]   Fax:  [phone]  Physical Therapy Treatment  Patient Details  Name: Tony Jennings MRN: [id number] Date of Birth: July 24, 1980 Referring Provider:  Van Clines, MD  Encounter Date: 12/19/2014      PT End of Session - 12/19/14 0809    Visit Number 2   Number of Visits 8   Date for PT Re-Evaluation 01/11/15   PT Start Time 0809   PT Stop Time 0849   PT Time Calculation (min) 40 min   Activity Tolerance Patient tolerated treatment well;No increased pain      Past Medical History  Diagnosis Date  . Seizures   . Anxiety   . Ulnar neuropathy at elbow 10/24/2014    Past Surgical History  Procedure Laterality Date  . Wrist surgery      There were no vitals filed for this visit.  Visit Diagnosis:  Weakness of left hand  Pain, hand joint, left      Subjective Assessment - 12/19/14 0855    Subjective Symptoms are about the same - no better no worse. Can really feel symptoms in the arm with stretch with pec stretch   Pertinent History Has been wearing an elbow brace with soft pad on ulnar nerve area to relieve pressure   Diagnostic tests nerve conduction test - pt hasn't recieved results yet.     Patient Stated Goals wants to have decreased symptoms  and play the guitar   Currently in Pain? Yes   Pain Score 7    Pain Location Arm   Pain Orientation Left   Pain Descriptors / Indicators Tingling;Tightness;Pins and needles  Stinging   Pain Type Acute pain   Pain Radiating Towards ulnar forearm   Pain Onset More than a month ago   Pain Frequency Constant   Pain Relieving Factors stretching his arm out              [facility] Adult PT Treatment/Exercise - 12/19/14 0001    Elbow Exercises   Wrist Flexion --  stretching x 30 sec   Wrist Exercises   Other wrist exercises doorway x3 positions; 2 reps; 20 sec hold   Other wrist  exercises scap retraction with foam rolll and with no tactile cue   Modalities   Modalities Ultrasound;Cryotherapy   Cryotherapy   Number Minutes Cryotherapy 10 Minutes   Cryotherapy Location Forearm  elbow Lt   Type of Cryotherapy Ice pack   Ultrasound   Ultrasound Location medial Lt forearm/ulnar   Ultrasound Parameters 50%, 1.0w/cm2; 3.3MHz   Ultrasound Goals Pain             PT Education - 12/19/14 0852    Education provided Yes   Education Details Education re- proximal influence of UE pathology; added scap squeeze and work with foam roll for posture; added doorway pec stretch X3 positions   Person(s) Educated Patient   Methods Explanation;Demonstration;Tactile cues;Verbal cues;Handout   Comprehension Verbalized understanding;Returned demonstration;Verbal cues required;Tactile cues required             PT Long Term Goals - 12/19/14 0906    PT LONG TERM GOAL #1   Title I with advanced HEP   Period Weeks   Status On-going   PT LONG TERM GOAL #2   Title increase grip Lt hand =/> 60#   Time 4   Period Weeks   Status On-going   PT LONG TERM  GOAL #3   Title increase lateral grip =/> 8#   Time 4   Period Weeks   Status On-going   PT LONG TERM GOAL #4   Title improve FOTO =/< 32% limited   Time 4   Period Weeks   Status On-going            Plan - 12/19/14 0904    Clinical Impression Statement Reproduced distal symptoms with pec stretch - work on posture and alignment as well as pec stretch/posterior shoulder girdle strengthening may help current symptoms in ulnar forearm   Pt will benefit from skilled therapeutic intervention in order to improve on the following deficits Impaired UE functional use;Decreased strength   Rehab Potential Good   PT Frequency 2x / week   PT Duration 4 weeks   PT Treatment/Interventions Moist Heat;Ultrasound;Cryotherapy;Electrical Stimulation;Patient/family education;Neuromuscular re-education;Therapeutic exercise;Manual  techniques;Passive range of motion;Taping   PT Next Visit Plan Progress with anterior shoulder stretching and posterior shoulder girdle strengthening as indicated; test neural tension   Consulted and Agree with Plan of Care Patient        Problem List Patient Active Problem List   Diagnosis Date Noted  . New onset seizure 10/24/2014  . Faintness 10/24/2014  . 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 Syncope 10/05/2014  . Generalized anxiety disorder 10/05/2014  . Seizures 10/05/2014    Ayeshia Coppin Lyn Henri, MPH 12/19/2014, 9:10 AM  [facility] [address] [place], [place], [address] Phone: [phone]   Fax:  [phone]

## 2014-12-21 ENCOUNTER — Encounter: Payer: BLUE CROSS/BLUE SHIELD | Admitting: Rehabilitative and Restorative Service Providers"

## 2014-12-22 ENCOUNTER — Ambulatory Visit: Payer: Self-pay | Admitting: Licensed Clinical Social Worker

## 2014-12-23 ENCOUNTER — Telehealth: Payer: Self-pay | Admitting: Family Medicine

## 2014-12-23 NOTE — Telephone Encounter (Signed)
Called patient twice outgoing msg says customer not available. Will try again.

## 2014-12-23 NOTE — Telephone Encounter (Signed)
-----   Message from Van Clines, MD sent at 12/21/2014 11:30 AM EDT ----- Regarding: pls f/u Can you pls call him again and let him know I have openings on Mon, Tues, and Wed, would like to discuss the nerve test results in the office. Thanks

## 2014-12-26 ENCOUNTER — Encounter: Payer: BLUE CROSS/BLUE SHIELD | Admitting: Rehabilitative and Restorative Service Providers"

## 2014-12-26 NOTE — Telephone Encounter (Signed)
Tried calling patient again. Outgoing msg still says wireless customer is not available.

## 2014-12-27 DIAGNOSIS — K8591 Acute pancreatitis with uninfected necrosis, unspecified: Secondary | ICD-10-CM | POA: Insufficient documentation

## 2014-12-28 ENCOUNTER — Encounter: Payer: BLUE CROSS/BLUE SHIELD | Admitting: Rehabilitative and Restorative Service Providers"

## 2014-12-28 ENCOUNTER — Encounter: Payer: Self-pay | Admitting: Family Medicine

## 2015-01-04 ENCOUNTER — Ambulatory Visit (INDEPENDENT_AMBULATORY_CARE_PROVIDER_SITE_OTHER): Payer: BLUE CROSS/BLUE SHIELD | Admitting: Neurology

## 2015-01-04 DIAGNOSIS — G40009 Localization-related (focal) (partial) idiopathic epilepsy and epileptic syndromes with seizures of localized onset, not intractable, without status epilepticus: Secondary | ICD-10-CM

## 2015-01-06 ENCOUNTER — Ambulatory Visit: Payer: BLUE CROSS/BLUE SHIELD | Admitting: Neurology

## 2015-01-13 ENCOUNTER — Ambulatory Visit (INDEPENDENT_AMBULATORY_CARE_PROVIDER_SITE_OTHER): Payer: BLUE CROSS/BLUE SHIELD | Admitting: Neurology

## 2015-01-13 ENCOUNTER — Encounter: Payer: Self-pay | Admitting: Neurology

## 2015-01-13 VITALS — BP 120/78 | HR 111 | Resp 16 | Ht 68.0 in | Wt 146.0 lb

## 2015-01-13 DIAGNOSIS — F411 Generalized anxiety disorder: Secondary | ICD-10-CM | POA: Diagnosis not present

## 2015-01-13 DIAGNOSIS — G40009 Localization-related (focal) (partial) idiopathic epilepsy and epileptic syndromes with seizures of localized onset, not intractable, without status epilepticus: Secondary | ICD-10-CM | POA: Diagnosis not present

## 2015-01-13 DIAGNOSIS — G610 Guillain-Barre syndrome: Secondary | ICD-10-CM | POA: Insufficient documentation

## 2015-01-13 MED ORDER — LEVETIRACETAM ER 500 MG PO TB24: ORAL_TABLET | ORAL | Status: DC

## 2015-01-13 NOTE — Progress Notes (Signed)
NEUROLOGY FOLLOW UP OFFICE NOTE  Tony Jennings [id number]  HISTORY OF PRESENT ILLNESS: I had the pleasure of seeing Tony Jennings in follow-up in the neurology clinic on 01/13/2015.  The patient was last seen 2 months ago for new onset seizure in January 2016 and an episode of loss of consciousness preceded by an out of body sensation. I had discussed starting anti-epileptic medication on his last visit, however he was hesitant and wanted to proceed with 24-hour EEG first to further classify his seizures. I reviewed MRI brain and EEG results from [place], which were normal. His 24-hour EEG showed rare left temporal slowing, no epileptiform discharges or electrographic seizures seen. Typical events were not captured, he reported mood change, with no associated EEG changes. He was noted to be tachycardic during majority of the study.   Since his last visit, he was admitted to [facility] from June 21-24 after he had a seizure at work. He denied having the out of body warning, he was talking to a co-worker then reportedly gasped and hit the floor convulsing. He shows a picture of facial abrasions over the forehead, nose, and upper lip and right black eye from the seizure. Records from [place] were reviewed, he was also found to have acute pancreatitis and elevated LFTs felt to be due to alcohol use. He reports he drinks to reduce the anxiety, and that he had been binge drinking for several days prior to the seizure. He denies any alcohol intake with the prior seizure. He was discharged home on Keppra 532m BID, he has noticed his hands would get shaky and he would "crash" at the end of the day feeling tired. At work he is able to function well. His girlfriend has noticed he is more short-tempered and defensive since starting Keppra. He continues to deal with significant anxiety and has not started the clonazepam. He states he has significantly reduced alcohol taken for anxiety, and would like to get  stable first on Keppra before starting the clonazepam for anxiety and quitting alcohol altogether.  He continues to have weakness and numbness in the left 5th>4th digit. Right arm is unaffected. Recently, he has also noticed pins and needles sensation in a discrete patch above his left knee on the lateral side lasting for several hours. No back pain, bowel/bladder dysfunction. 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 He has done 2 sessions of PT and has not noticed much improvement.  HPI: This is a 34yo LH man with a history of anxiety presenting for new onset seizure last January 2016. He was dozing on the couch and does not recall much except for flashes of memory/snapshots of the event. Apparently his girlfriend witnessed a generalized convulsion. No tongue bite or incontinence. He was weak after, feeling confused and cloudy. [place]. He was [place] on [place], [place]. On 09/29/14, he was out for a walk then had an out of body sensation as if he was seeing himself from above. He knew what was happening but could not control, feeling himself fall, then everything went black. He had scrapes over his lip and right hand, and woke up on the ground. He is unsure how long he was out. He recall having  bad anxiety at that time. He denied any olfactory/gustatory hallucinations, focal numbness/tingling/weakness, no tongue bite/incontinence. He denies any clear triggers prior to the events, sleep is fine, he denies alcohol intake. He has significant anxiety, which has ramped back up in the past few years.  He has been told by his girlfriend that there are times she can see she is losing him (?staring), he is  unaware he is doing this. He reports body jerks every now and then. He recalls having the out of body sensation almost every night for a week after the episode last month. He has palpitations with his anxiety. He denies any headaches, dizziness, diplopia, neck/back pain, bowel/bladder dysfunction. He has constant tingling down his left hand in an ulnar distribution, he cannot feel his pinky finger when typing. He denies any elbow pain.   Epilepsy Risk Factors: He had a normal birth and early development. There is no history of febrile convulsions, CNS infections such as meningitis/encephalitis, significant traumatic brain injury, neurosurgical procedures, or family history of seizures. He fell off a stage 10-11 years ago and hit his head with brief loss of consciousness.   Diagnostic Data: MRI brain and routine EEG at [facility] reported as normal. 24-hour EEG showed rare left temporal focal slowing, no epileptiform discharges. Mood change did not show EEG changes. He was noted to be tachycardic during majority of the study.  PAST MEDICAL HISTORY: Past Medical History  Diagnosis Date  . Seizures   . Anxiety   . Ulnar neuropathy at elbow 10/24/2014    MEDICATIONS: No current outpatient prescriptions on file prior to visit.   No current facility-administered medications on file prior to visit.    ALLERGIES: No Known Allergies  FAMILY HISTORY: Family History  Problem Relation Age of Onset  . Hypertension Mother   . Hypertension Father     SOCIAL HISTORY: History   Social History  . Marital Status: Divorced    Spouse Name: N/A  . Number of Children: N/A  . Years of Education: N/A   Occupational History  . Corporate treasurer    Social History Main Topics  . Smoking status: Current Every Day Smoker  . Smokeless tobacco: Never Used  . Alcohol Use: 0.0 oz/week    0 Standard drinks or equivalent per week     Comment: Drank heavily for years in past.  Recenlty drinking more  with anxiety.   . Drug Use: No     Comment: prior use marijuana, snorted ritalin, rare cocain. (But this was when 34 yo)  . Sexual Activity: Yes   Other Topics Concern  . Not on file   Social History Narrative    REVIEW OF SYSTEMS: Constitutional: No fevers, chills, or sweats, no generalized fatigue, change in appetite Eyes: No visual changes, double vision, eye pain Ear, nose and throat: No hearing loss, ear pain, nasal congestion, sore throat Cardiovascular: No chest pain, palpitations Respiratory:  No shortness of breath at rest or with exertion, wheezes GastrointestinaI: No nausea, vomiting, diarrhea, abdominal pain, fecal incontinence Genitourinary:  No dysuria, urinary retention or frequency Musculoskeletal:  No neck pain, back pain Integumentary: No rash, pruritus, skin lesions Neurological: as above Psychiatric: No depression, insomnia,++ anxiety Endocrine: No palpitations, fatigue, diaphoresis, mood swings, change in appetite, change in weight, increased thirst Hematologic/Lymphatic:  No anemia, purpura, petechiae. Allergic/Immunologic: no itchy/runny eyes, nasal congestion, recent allergic reactions, rashes  PHYSICAL EXAM: Filed Vitals:   01/13/15 1132  BP: 120/78  Pulse: 111  Resp: 16  General: No acute distress, anxious Head:  Normocephalic/atraumatic Neck: supple, no paraspinal tenderness, full range of motion Heart:  Regular rate and rhythm Lungs:  Clear to auscultation bilaterally Back: No paraspinal tenderness Skin/Extremities: No rash, no edema Neurological Exam: alert and oriented to person, place, and time. No aphasia or dysarthria. Fund of knowledge is appropriate.  Recent and remote memory are intact.  Attention and concentration are normal.    Able to name objects and repeat phrases. Cranial nerves: Pupils equal, round, reactive to light.  Fundoscopic exam unremarkable, no papilledema. Extraocular movements intact with no nystagmus. Visual fields full.  Facial sensation intact. No facial asymmetry. Tongue, uvula, palate midline.  Motor: Bulk and tone normal, muscle strength 5/5 throughout except for 3+/5 weakness of left abductor digit minimi, no pronator drift (similar to prior). Sensation decreased on left 4th and 5th digits. No extinction to double simultaneous stimulation. Deep tendon reflexes 2+ throughout except for +1 left brachialis, brachioradialis and triceps, toes downgoing. Finger to nose testing intact. Gait narrow-based and steady, able to tandem walk adequately. Romberg negative.  IMPRESSION: This is a 34 yo LH man with a history of anxiety, with new onset seizures. The first seizure occurred in January 2016, he denies any alcohol intake at that time. He had unwitnessed episode of loss of consciousness last 09/29/14 preceded by an out of body sensation. The last seizure occurred at work last 12/27/14 in the setting of alcohol intake. He was also diagnosed with pancreatitis and elevated LFTs. He is scheduled to see GI soon. He is now on Keppra 578m BID but has significant fatigue. He will switch to Keppra XR 5039m2 tabs daily and assess if side effects continue. He had also complained of left hand numbness and weakness in an ulnar distribution, right hand asymptomatic, however EMG/NCV showed abnormalities on both upper extremities, concerning for a subacute sensorimotor polyradiculoneuropathy. Bloodwork will be ordered to further evaluate this, including TSH, B12, folate, Vit B6, B1, copper, vitamin E, celiac's, thiamine, RF, ANA, ENA, cryoglobulins, ESR/CRP, ANCA panel, SPEP/UPEP, ACE, ceruloplasmin, zinc. We also discussed doing a lumbar puncture to assess protein level, concern is for an inflammatory polyneuropathy. He is very anxious and would like to speak to his girlfriend first before proceeding with the LP. Continue physical therapy. We discussed [place] driving laws that indicate that one should stop driving after a seizure until 6 months  seizure-free. He will follow-up in 2 months.   Thank you for allowing me to participate in his care.  Please do not hesitate to call for any questions or concerns.  The duration of this appointment visit was 27 minutes of face-to-face time with the patient.  Greater than 50% of this time was spent in counseling, explanation of diagnosis, planning of further management, and coordination of care.   KaEllouise NewerM.D.   CC: Dr. RaSalvadore Dom

## 2015-01-13 NOTE — Patient Instructions (Addendum)
1. Switch to Keppra XR 562m: Take 2 tablets once a day 2. Bloodwork for TSH, B12, folate, Vit B6, B1, copper, vitamin E, celiac's, thiamine, RF, ANA, ENA, cryoglobulins, ESR/CRP, ANCA panel, SPEP/UPEP, ACE, ceruloplasmin, zinc 3. Call our office once you have decided about the spinal tap 4. Continue PT 5. As per [place] driving laws, no driving after a seizure until 6 months seizure-free  Seizure Precautions: 1. If medication has been prescribed for you to prevent seizures, take it exactly as directed.  Do not stop taking the medicine without talking to your doctor first, even if you have not had a seizure in a long time.   2. Avoid activities in which a seizure would cause danger to yourself or to others.  Don't operate dangerous machinery, swim alone, or climb in high or dangerous places, such as on ladders, roofs, or girders.  Do not drive unless your doctor says you may.  3. If you have any warning that you may have a seizure, lay down in a safe place where you can't hurt yourself.    4.  No driving for 6 months from last seizure, as per [facility]   Please refer to the following link on the ENorth [place]website for more information: http://www.epilepsyfoundation.org/answerplace/Social/driving/drivingu.cfm   5.  Maintain good sleep hygiene.  6.  Contact your doctor if you have any problems that may be related to the medicine you are taking.  7.  Call 911 and bring the patient back to the ED if:        A.  The seizure lasts longer than 5 minutes.       B.  The patient doesn't awaken shortly after the seizure  C.  The patient has new problems such as difficulty seeing, speaking or moving  D.  The patient was injured during the seizure  E.  The patient has a temperature over 102 F (39C)  F.  The patient vomited and now is having trouble breathing

## 2015-01-16 ENCOUNTER — Ambulatory Visit (INDEPENDENT_AMBULATORY_CARE_PROVIDER_SITE_OTHER): Payer: BLUE CROSS/BLUE SHIELD | Admitting: Rehabilitative and Restorative Service Providers"

## 2015-01-16 ENCOUNTER — Other Ambulatory Visit: Payer: BLUE CROSS/BLUE SHIELD

## 2015-01-16 DIAGNOSIS — M6289 Other specified disorders of muscle: Secondary | ICD-10-CM | POA: Diagnosis not present

## 2015-01-16 DIAGNOSIS — R29898 Other symptoms and signs involving the musculoskeletal system: Secondary | ICD-10-CM

## 2015-01-16 DIAGNOSIS — M436 Torticollis: Secondary | ICD-10-CM | POA: Diagnosis not present

## 2015-01-16 DIAGNOSIS — M25612 Stiffness of left shoulder, not elsewhere classified: Secondary | ICD-10-CM

## 2015-01-16 DIAGNOSIS — M79642 Pain in left hand: Secondary | ICD-10-CM

## 2015-01-16 NOTE — Therapy (Signed)
[facility] [address] [place], [place], [address] Phone: [phone]   Fax:  [phone]  Physical Therapy Treatment  Patient Details  Name: Tony Jennings MRN: [id number] Date of Birth: [birth year] Referring Provider:  Van ClinesAquino, Karen M, MD  Encounter Date: 01/16/2015      PT End of Session - 01/16/15 0804    Visit Number 3   Number of Visits 12   Date for PT Re-Evaluation 02/27/15   PT Start Time 0708   PT Stop Time 0806   PT Time Calculation (min) 58 min   Activity Tolerance Patient tolerated treatment well      Past Medical History  Diagnosis Date  . Seizures   . Anxiety   . Ulnar neuropathy at elbow 10/24/2014    Past Surgical History  Procedure Laterality Date  . Wrist surgery      There were no vitals filed for this visit.  Visit Diagnosis:  Weakness of left hand - Plan: PT plan of care cert/re-cert  Pain, hand joint, left - Plan: PT plan of care cert/re-cert  Stiffness of cervical spine - Plan: PT plan of care cert/re-cert  Stiffness of joint, shoulder region, left - Plan: PT plan of care cert/re-cert      Subjective Assessment - 01/16/15 0712    Subjective Patient reports that he experienced a seizure at work ~ 12/20/13 and was hospitalized for 2 weeks for injuries and work up for seizures. He is now in medication and restricted activity level. He is unable to drive for 6 months.  He reports that UE symptoms have changed some. He has resolution of tingling in the ring finger; little finger is unchanged. Neurologist want sot have additional tests done.    Pertinent History Has been wearing an elbow brace with soft pad on ulnar nerve area to relieve pressure - some but less, not sure it helps that much.   Diagnostic tests nerve conduction test - some changes   Patient Stated Goals wants to have decreased symptoms  and play the guitar   Currently in Pain? Yes   Pain Location Hand   Pain  Orientation Left   Pain Descriptors / Indicators Tingling   Pain Type Chronic pain;Acute pain   Pain Radiating Towards ulnar border of hand into the little finger   Pain Onset More than a month ago   Pain Frequency Constant  intensity varies   Aggravating Factors  using Lt hand   Pain Relieving Factors avoiding use of Lt hand            [facility] PT Assessment - 01/16/15 0001    Observation/Other Assessments   Focus on Therapeutic Outcomes (FOTO)  59% limitation   Posture/Postural Control   Posture Comments head forward, shoulders rounded and elevated, head of the humerus anterior in orientatioin, scapulae abducted and rotated along the thoracic wall,, arms in IR at side   AROM   Overall AROM  --  tightness end ranges Lt > Rt shoulder/c-spine   Strength   Left Hand Grip (lbs) 76   Left Hand Lateral Pinch 5 lbs   Palpation   Palpation comment hypersensitive to touch medial hand and little finger            [facility] Adult PT Treatment/Exercise - 01/16/15 0001    Wrist Exercises   Other wrist exercises doorway x3 positions; 2 reps; 20 sec hold   Other wrist exercises scap retraction with foam rolll and with no tactile  cue   Cryotherapy   Number Minutes Cryotherapy 12 Minutes   Cryotherapy Location Shoulder;Cervical   Type of Cryotherapy Ice pack   Manual Therapy   Manual Therapy --  increased Lt UE symptoms w/ manual techniques   Manual therapy comments working with pt in supine position   Joint Mobilization C-spine PA and lateral glides   Soft tissue mobilization working through c-spine and anterior/inferior shoulder girdle Lt; ant shoulder Rt   Myofascial Release chest/shoulder   Scapular Mobilization Lt   Passive ROM C-spine into flexion and lateral flexion           PT Education - 01/16/15 1003    Education provided Yes   Education Details Continued educatioin re- posture and alignment; ergonomic changes for work and home; reviewed HEP cont to work on [employer]; added lying supine on foam roll for t-spine mobility and pec stretch; discuaaion of pathology   Person(s) Educated Patient   Methods Explanation;Demonstration;Tactile cues;Verbal cues   Comprehension Verbalized understanding;Returned demonstration             PT Long Term Goals - 01/16/15 1008    PT LONG TERM GOAL #1   Title I with advanced HEP 02/27/15   Time 4   Period Weeks   Status On-going   PT LONG TERM GOAL #2   Title increase grip Lt hand =/> 60#    Time 4   Period Weeks   Status Achieved   PT LONG TERM GOAL #3   Title increase lateral grip =/> 8# 02/27/15   Time 4   Period Weeks   Status On-going   PT LONG TERM GOAL #4   Title improve FOTO =/< 32% limited 02/27/15   Time 4   Period Weeks   Status On-going   PT LONG TERM GOAL #5   Title report =/> 50% ease when playing his guitar 02/27/15   Time 4   Period Weeks   Status On-going   Additional Long Term Goals   Additional Long Term Goals Yes   PT LONG TERM GOAL #6   Title Improve postrue and alignment with pt to demonstrate pain free shoulder and cervical tightness without reproducing Lt hand symptoms.02/27/15   Time 4   Period Weeks   Status New           Plan - 01/16/15 1005    Clinical Impression Statement Lt hand/little finger symptoms are reproduced with work through c-spine and chest. Symptoms suggest possibility of thoracic outlet type syndrome.   Pt will benefit from skilled therapeutic intervention in order to improve on the following deficits Impaired UE functional use;Decreased strength   Rehab Potential Good   PT Frequency 2x / week   PT Duration 6 weeks   PT Treatment/Interventions Moist Heat;Ultrasound;Cryotherapy;Electrical Stimulation;Patient/family education;Neuromuscular re-education;Therapeutic exercise;Manual techniques;Passive range of motion;Taping   PT Next Visit Plan Continue soft tissue work through c-spine and Lt soulder girdle - allowing breaks for symptoms to  return to baseline. Progress with anterior shoulder stretching and posterior shoulder girdle strengthening as indicated; test neural tension   Consulted and Agree with Plan of Care Patient        Problem List Patient Active Problem List   Diagnosis Date Noted  . 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 Polyradiculoneuropathy 01/13/2015  . New onset seizure 10/24/2014  . Faintness 10/24/2014  . 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 Syncope 10/05/2014  .  Generalized anxiety disorder 10/05/2014  . Seizures 10/05/2014    Celyn P Holt,PT, MPH 01/16/2015, 10:40 AM  [facility] [address] [place], [place], [address] Phone: [phone]   Fax:  [phone]

## 2015-01-18 NOTE — Procedures (Signed)
ELECTROENCEPHALOGRAM REPORT  Dates of Recording: 01/04/2015 to 01/05/2015  Patient's Name: Tony RiverJeffrey D Nikolic MRN: [id number] Date of Birth: 1981-06-09  Referring Provider: Dr. Patrcia DollyKaren Kemari Narez  Procedure: 24-hour ambulatory EEG  History: This is a 34 year old man with recurrent convulsions, one episode of loss of consciousness preceded by out of body sensation. EEG for classification  Medications: Keppra  Technical Summary: This is a 24-hour multichannel digital EEG recording measured by the international 10-20 system with electrodes applied with paste and impedances below 5000 ohms performed as portable with EKG monitoring.  The digital EEG was referentially recorded, reformatted, and digitally filtered in a variety of bipolar and referential montages for optimal display.    DESCRIPTION OF RECORDING: During maximal wakefulness, the background activity consisted of a symmetric 11 Hz posterior dominant rhythm which was reactive to eye opening.  There was rare focal 4-5 Hz theta slowing seen over the left temporal region. There were no epileptiform discharges seen in wakefulness.  During the recording, the patient progresses through wakefulness, drowsiness, and Stage 2 sleep.  Similar rare focal theta slowing is seen over the left temporal region. Again, there were no epileptiform discharges seen.  Events: On 06/29 at 1737 hours, patient reports mood change. Electrographically, there were no EEG or EKG changes seen.  There were no electrographic seizures seen.  He is noted to be tachycardic throughout majority of the study, from 120-150 bpm with no symptoms reported.  IMPRESSION: This 24-hour ambulatory EEG study is mildly abnormal due to rare focal slowing over the left temporal region.  CLINICAL CORRELATION of the above findings indicates focal cerebral dysfunction over the left temporal region suggestive of underlying structural or physiologic abnormality. The absence of epileptiform  discharges does not exclude a clinical diagnosis of epilepsy. Patient is noted to have sinus tachycardia majority of the study without clinical symptoms reported.   Patrcia DollyKaren Avante Carneiro, M.D.

## 2015-01-23 ENCOUNTER — Ambulatory Visit (INDEPENDENT_AMBULATORY_CARE_PROVIDER_SITE_OTHER): Payer: BLUE CROSS/BLUE SHIELD | Admitting: Rehabilitative and Restorative Service Providers"

## 2015-01-23 ENCOUNTER — Encounter: Payer: Self-pay | Admitting: Rehabilitative and Restorative Service Providers"

## 2015-01-23 DIAGNOSIS — M25612 Stiffness of left shoulder, not elsewhere classified: Secondary | ICD-10-CM | POA: Diagnosis not present

## 2015-01-23 DIAGNOSIS — M79642 Pain in left hand: Secondary | ICD-10-CM

## 2015-01-23 DIAGNOSIS — M6289 Other specified disorders of muscle: Secondary | ICD-10-CM | POA: Diagnosis not present

## 2015-01-23 DIAGNOSIS — M436 Torticollis: Secondary | ICD-10-CM | POA: Diagnosis not present

## 2015-01-23 DIAGNOSIS — R29898 Other symptoms and signs involving the musculoskeletal system: Secondary | ICD-10-CM

## 2015-01-23 NOTE — Therapy (Signed)
[facility] [place] [address] [place], [place], [address] Phone: [phone]   Fax:  [phone]  Physical Therapy Treatment  Patient Details  Name: Tony Jennings MRN: [id number] Date of Birth: [birth year] Referring Provider:  Monica Becton,*  Encounter Date: 01/23/2015      PT End of Session - 01/23/15 1250    Visit Number 4   Number of Visits 12   Date for PT Re-Evaluation 02/27/15   PT Start Time 0733   PT Stop Time 0814   PT Time Calculation (min) 41 min   Activity Tolerance Patient tolerated treatment well      Past Medical History  Diagnosis Date  . Seizures   . Anxiety   . Ulnar neuropathy at elbow 10/24/2014    Past Surgical History  Procedure Laterality Date  . Wrist surgery      There were no vitals filed for this visit.  Visit Diagnosis:  Weakness of left hand  Pain, hand joint, left  Stiffness of cervical spine  Stiffness of joint, shoulder region, left      Subjective Assessment - 01/23/15 1241    Subjective Patient reports that he may notice a little improvement in symptoms. He does have less pain in his ulnar forearm but continued symptoms in the Lt wrist and hand. Realizes that these symptoms are reproduced by problems in his neck and shoulder    Pertinent History Has been wearing an elbow brace with soft pad on ulnar nerve area to relieve pressure - some but less, not sure it helps that much.   Currently in Pain? No/denies   Aggravating Factors  Pain is produced by manual work through [employer] and clavical/rib area Lt UE.   Pain Relieving Factors avoiding use of Lt hand; time; ice           [facility] Adult PT Treatment/Exercise - 01/23/15 0001    Neck Exercises: Supine   Neck Retraction 10 reps;10 secs   Other Supine Exercise snow angel prolonged stretch 2-3 min   Wrist Exercises   Other wrist exercises doorway x3 positions; 2 reps; 20 sec hold   Other wrist exercises scap  retraction with foam rolll and with no tactile cue   Cryotherapy   Number Minutes Cryotherapy 15 Minutes   Cryotherapy Location Shoulder;Cervical   Type of Cryotherapy Ice pack   Manual Therapy   Manual Therapy --  increased Lt UE symptoms w/manual techniques/resolve w/time   Manual therapy comments working with pt in supine position   Joint Mobilization C-spine PA and lateral glides   Soft tissue mobilization working through c-spine and anterior/inferior shoulder girdle Lt; ant shoulder Rt   Myofascial Release chest/shoulder   Scapular Mobilization Lt   Passive ROM C-spine into flexion and lateral flexion            PT Education - 01/23/15 1247    Education provided Yes   Education Details Added chin tuck/axial extension, snow angel prolonged stretch to tolerance of symptoms; continued education; work on [employer]) Educated Patient   Methods Explanation;Demonstration;Tactile cues;Verbal cues;Handout   Comprehension Verbalized understanding;Returned demonstration;Verbal cues required;Tactile cues required             PT Long Term Goals - 01/16/15 1008    PT LONG TERM GOAL #1   Title I with advanced HEP 02/27/15   Time 4   Period Weeks   Status On-going   PT LONG TERM GOAL #2  Title increase grip Lt hand =/> 60#    Time 4   Period Weeks   Status Achieved   PT LONG TERM GOAL #3   Title increase lateral grip =/> 8# 02/27/15   Time 4   Period Weeks   Status On-going   PT LONG TERM GOAL #4   Title improve FOTO =/< 32% limited 02/27/15   Time 4   Period Weeks   Status On-going   PT LONG TERM GOAL #5   Title report =/> 50% ease when playing his guitar 02/27/15   Time 4   Period Weeks   Status On-going   Additional Long Term Goals   Additional Long Term Goals Yes   PT LONG TERM GOAL #6   Title Improve postrue and alignment with pt to demonstrate pain free shoulder and cervical tightness without reproducing Lt hand symptoms.02/27/15    Time 4   Period Weeks   Status New            Plan - 01/23/15 1250    Clinical Impression Statement Positive response to intervention - manula work through c-spine; thoracic area - clavical and 1st ribs as well as pecs/teres; etc. Signs and symptoms are consistent with possible thoracic outlet symdrome. Patient has been unable to attend PT consistently due to unrelated medical problems and work constraints. He will benefit from continued therapy to accomplish goals and resolve symptoms.   Pt will benefit from skilled therapeutic intervention in order to improve on the following deficits Impaired UE functional use;Decreased strength   Rehab Potential Good   PT Frequency 2x / week   PT Duration 6 weeks   PT Treatment/Interventions Moist Heat;Ultrasound;Cryotherapy;Electrical Stimulation;Patient/family education;Neuromuscular re-education;Therapeutic exercise;Manual techniques;Passive range of motion;Taping   PT Next Visit Plan Continue soft tissue work through c-spine and Lt soulder girdle - allowing breaks for symptoms to return to baseline. Progress with anterior shoulder stretching and posterior shoulder girdle strengthening as indicated; test neural tension   PT Home Exercise Plan axial extensioin/chin tuck; snow angel for prolonged stretch   Consulted and Agree with Plan of Care Patient        Problem List Patient Active Problem List   Diagnosis Date Noted  . 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 Polyradiculoneuropathy 01/13/2015  . New onset seizure 10/24/2014  . Faintness 10/24/2014  . 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 Syncope 10/05/2014  . Generalized anxiety disorder 10/05/2014  . Seizures 10/05/2014    Laken Lobato Rober MinionP Josseline Reddin, PT, MPH 01/23/2015, 12:55 PM  [facility] [address] [place] [address] [place],  [place], [id number] Phone: [phone]   Fax:  [phone]

## 2015-01-23 NOTE — Patient Instructions (Signed)
Axial Extension (Chin Tuck)   Pull chin in and lengthen back of neck. Hold _10___ seconds  Repeat _10___ times. Do _several___ sessions per day.  [employer] Angel  Lying on back arms out to side at ~90 degrees Working toward 5 min - 10 min  Thoracic outlet   Copyright  VHI. All rights reserved.

## 2015-01-25 ENCOUNTER — Ambulatory Visit (INDEPENDENT_AMBULATORY_CARE_PROVIDER_SITE_OTHER): Payer: BLUE CROSS/BLUE SHIELD | Admitting: Rehabilitative and Restorative Service Providers"

## 2015-01-25 ENCOUNTER — Encounter: Payer: Self-pay | Admitting: Rehabilitative and Restorative Service Providers"

## 2015-01-25 DIAGNOSIS — M79642 Pain in left hand: Secondary | ICD-10-CM | POA: Diagnosis not present

## 2015-01-25 DIAGNOSIS — M436 Torticollis: Secondary | ICD-10-CM | POA: Diagnosis not present

## 2015-01-25 DIAGNOSIS — M25612 Stiffness of left shoulder, not elsewhere classified: Secondary | ICD-10-CM | POA: Diagnosis not present

## 2015-01-25 DIAGNOSIS — M6289 Other specified disorders of muscle: Secondary | ICD-10-CM | POA: Diagnosis not present

## 2015-01-25 DIAGNOSIS — R29898 Other symptoms and signs involving the musculoskeletal system: Secondary | ICD-10-CM

## 2015-01-25 NOTE — Patient Instructions (Signed)
Remember posture - NO BIG C!!!   Shoulder Blade Squeeze   Rotate shoulders back, then squeeze shoulder blades down and back. Hold 10 sec Repeat __10__ times. Do _several___ sessions per day.   ball release work through back, shoulder and front of your chest  [place] [email or web address] [email or web address] = check for ball

## 2015-01-25 NOTE — Therapy (Addendum)
[place] [address] [place], [place], [address] Phone: [phone]   Fax:  [phone]  Physical Therapy Treatment  Patient Details  Name: Tony Jennings MRN: [id number] Date of Birth: 27-Jun-1981 Referring Provider:  Cameron Sprang, MD  Encounter Date: 01/25/2015      PT End of Session - 01/25/15 0734    Visit Number 5   Number of Visits 12   Date for PT Re-Evaluation 02/27/15   PT Start Time 0733   PT Stop Time 0815   PT Time Calculation (min) 42 min   Activity Tolerance Patient tolerated treatment well  decreased radicular symptoms with treatment      Past Medical History  Diagnosis Date  . Seizures   . Anxiety   . Ulnar neuropathy at elbow 10/24/2014    Past Surgical History  Procedure Laterality Date  . Wrist surgery      There were no vitals filed for this visit.  Visit Diagnosis:  Weakness of left hand  Pain, hand joint, left  Stiffness of cervical spine  Stiffness of joint, shoulder region, left      Subjective Assessment - 01/25/15 0816    Subjective Some less pain - numbness seems to resolve quicker after therapy or when it becomes irritated. Good response to manual work   Currently in Pain? No/denies   Pain Descriptors / Indicators Numbness   Pain Radiating Towards less pain - still intermittent numbness and tingling in lateral hand and little finger - sometimes into the ring finger              [facility] Adult PT Treatment/Exercise - 01/25/15 0001    Posture/Postural Control   Posture Comments observed pt sitting in lobby forward rounded posture elbows propped on knees on phone    Neck Exercises: Supine   Other Supine Exercise snow angel prolonged stretch 2-3 min   Wrist Exercises   Other wrist exercises doorway x3 positions; 2 reps; 20 sec hold   Other wrist exercises scap retraction with foam rolll and with no tactile cue   Cryotherapy   Number Minutes Cryotherapy 6  Minutes   Cryotherapy Location Cervical;Shoulder   Type of Cryotherapy Ice pack   Manual Therapy   Manual Therapy --  increased Lt UE symptoms w/manual techniques/resolve w/time   Manual therapy comments working with pt in supine position   Joint Mobilization C-spine PA and lateral glides   Soft tissue mobilization working through c-spine and anterior/inferior shoulder girdle Lt; ant shoulder Rt   Myofascial Release chest/shoulder   Scapular Mobilization Lt   Passive ROM C-spine into flexion and lateral flexion            PT Education - 01/25/15 0814    Education provided Yes   Education Details continue education re pathology; added scap squeeze and ball release work   [employer]) Educated Patient   Methods Explanation;Demonstration;Tactile cues;Verbal cues;Handout   Comprehension Verbalized understanding;Returned demonstration;Verbal cues required;Tactile cues required             PT Long Term Goals - 01/16/15 1008    PT LONG TERM GOAL #1   Title I with advanced HEP 02/27/15   Time 4   Period Weeks   Status On-going   PT LONG TERM GOAL #2   Title increase grip Lt hand =/> 60#    Time 4   Period Weeks   Status Achieved   PT LONG TERM GOAL #3   Title increase lateral  grip =/> 8# 02/27/15   Time 4   Period Weeks   Status On-going   PT LONG TERM GOAL #4   Title improve FOTO =/< 32% limited 02/27/15   Time 4   Period Weeks   Status On-going   PT LONG TERM GOAL #5   Title report =/> 50% ease when playing his guitar 02/27/15   Time 4   Period Weeks   Status On-going   Additional Long Term Goals   Additional Long Term Goals Yes   PT LONG TERM GOAL #6   Title Improve postrue and alignment with pt to demonstrate pain free shoulder and cervical tightness without reproducing Lt hand symptoms.02/27/15   Time 4   Period Weeks   Status New           Plan - 01/25/15 0825    Clinical Impression Statement Responding well to treatment with less pain and some  decrease in the duration of numbness. Note continued tightness through the cervcal and upper thoracic spine as well as into L teres/pecs/traps   Pt will benefit from skilled therapeutic intervention in order to improve on the following deficits Impaired UE functional use;Decreased strength   Rehab Potential Good   PT Frequency 2x / week   PT Duration 6 weeks   PT Treatment/Interventions Moist Heat;Ultrasound;Cryotherapy;Electrical Stimulation;Patient/family education;Neuromuscular re-education;Therapeutic exercise;Manual techniques;Passive range of motion;Taping   PT Next Visit Plan Continue soft tissue work through c-spine and Lt soulder girdle - allowing breaks for symptoms to return to baseline. Progress with anterior shoulder stretching and posterior shoulder girdle strengthening as indicated; test neural tension   PT Home Exercise Plan scap squeeze with and without noodle; ball release work   Oncologist with Plan of Care Patient        Problem List Patient Active Problem List   Diagnosis Date Noted  . 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 Polyradiculoneuropathy 01/13/2015  . New onset seizure 10/24/2014  . Faintness 10/24/2014  . 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 Syncope 10/05/2014  . Generalized anxiety disorder 10/05/2014  . Seizures 10/05/2014    Onda Kattner Nilda Simmer, PT, MPH 01/25/2015, 8:29 AM  [facility] [place], [place], [address] Phone: [phone]   Fax:  [phone]     PHYSICAL THERAPY DISCHARGE SUMMARY  Visits from Start of Care: 5  Current functional level related to goals / functional outcomes: Some improvement in radicular symptoms, progressing well with work through cervical spine and clavicular areas.     Remaining deficits: Continued Lt UE numbness and  tingling on an intermittent basis   Education / Equipment: HEP  Plan: Patient agrees to discharge.  Patient goals were partially met. Patient is being discharged due to not returning since the last visit.  ?????    Marni Franzoni P. Helene Kelp PT, MPH 03/03/2015 12:54 PM

## 2015-01-30 ENCOUNTER — Encounter: Payer: Self-pay | Admitting: Rehabilitative and Restorative Service Providers"

## 2015-02-01 ENCOUNTER — Encounter: Payer: Self-pay | Admitting: Rehabilitative and Restorative Service Providers"

## 2015-02-02 DIAGNOSIS — K863 Pseudocyst of pancreas: Secondary | ICD-10-CM | POA: Insufficient documentation

## 2015-02-08 ENCOUNTER — Ambulatory Visit: Payer: BLUE CROSS/BLUE SHIELD | Admitting: Neurology

## 2015-03-20 ENCOUNTER — Ambulatory Visit: Payer: Self-pay | Admitting: Neurology

## 2015-08-09 ENCOUNTER — Other Ambulatory Visit: Payer: Self-pay | Admitting: Neurology

## 2016-01-08 ENCOUNTER — Encounter (HOSPITAL_COMMUNITY): Payer: Self-pay | Admitting: Emergency Medicine

## 2016-01-08 ENCOUNTER — Emergency Department (HOSPITAL_COMMUNITY)
Admission: EM | Admit: 2016-01-08 | Discharge: 2016-01-09 | Disposition: A | Discharge status: Home / Self Care | Payer: BLUE CROSS/BLUE SHIELD | Attending: Emergency Medicine | Admitting: Emergency Medicine

## 2016-01-08 DIAGNOSIS — Z79899 Other long term (current) drug therapy: Secondary | ICD-10-CM | POA: Diagnosis not present

## 2016-01-08 DIAGNOSIS — R569 Unspecified convulsions: Secondary | ICD-10-CM | POA: Diagnosis present

## 2016-01-08 DIAGNOSIS — F17200 Nicotine dependence, unspecified, uncomplicated: Secondary | ICD-10-CM | POA: Insufficient documentation

## 2016-01-08 DIAGNOSIS — G40909 Epilepsy, unspecified, not intractable, without status epilepticus: Secondary | ICD-10-CM | POA: Insufficient documentation

## 2016-01-08 NOTE — ED Notes (Signed)
Pt BIBA, being treated at [facility] for alcohol detox. Pt has been having decreased Ativan doses for CIWA, none given today, 6 days since last drink. Pt noted to have a seizure by staff with full body shaking. Pt reports previous hx of seizures with detox. Pt given 10mg  Ativan IM per staff at [facility]. Pt a&ox4, but reports feeling "sleepy." VSS.

## 2016-01-08 NOTE — ED Provider Notes (Signed)
CSN: [id number]     Arrival date & time 01/08/16  2317 History  By signing my name below, I, Renetta ChalkBobby Ross, attest that this documentation has been prepared under the direction and in the presence of Axl Rodino PA-C.  Electronically Signed: Renetta ChalkBobby Ross, ED Scribe. 01/09/2016. 12:35 AM    Chief Complaint  Patient presents with  . Seizures   The history is provided by the patient, the EMS personnel and a parent. No language interpreter was used.   HPI Comments: Tony Jennings is a 35 y.o. male with a PMHx of anxiety, brought in by ambulance, who presents to the Emergency Department s/p a possible seizure that occurred this evening. Pt is a&ox3 currently and states he feels well. Pt states he was sitting outside with coworkers and the next thing he remembers is waking up and his friends were standing over him asking if he was okay. Bystanders stated that he was sitting on the stairs when he fell forward. Pt's father states that he spoke to a nurse that told him that the Pt did have a seizure. Pt is later admits he is currently being treated at [facility] for alcohol[facility]; last drink 7 days ago. He was at the facility when symptoms occurred. Pt has a Hx of seizures but states he has taken medications for years, and denies any seizure due to alcohol withdrawal. Pt states he has been taking his seizure medications as prescribed but cannot recall the name. Pt states he thinks he has been doing well in rehab so far. Pt reports that he has had a normal appetite. Pt denies Hx of syncopal episodes. Pt denies any Hx of drug use. Pt denies any fever, vomiting, cough, dysuria, chest pain, shortness of breath.   Past Medical History  Diagnosis Date  . Seizures (HCC)   . Anxiety   . Ulnar neuropathy at elbow 10/24/2014   Past Surgical History  Procedure Laterality Date  . Wrist surgery     Family History  Problem Relation Age of Onset  . Hypertension Mother   . Hypertension Father    Social History   Substance Use Topics  . Smoking status: Current Every Day Smoker  . Smokeless tobacco: Never Used  . Alcohol Use: 0.0 oz/week    0 Standard drinks or equivalent per week     Comment: Drank heavily for years in past.  Recenlty drinking more with anxiety.     Review of Systems  Constitutional: Negative for fever.  Respiratory: Negative for cough and shortness of breath.   Cardiovascular: Negative for chest pain.  Gastrointestinal: Negative for vomiting.  Genitourinary: Negative for dysuria.  Neurological: Positive for seizures.      Allergies  Review of patient's allergies indicates no known allergies.  Home Medications   Prior to Admission medications   Medication Sig Start Date End Date Taking? Authorizing Provider  alum & mag hydroxide-simeth (MAALOX/MYLANTA) 200-200-20 MG/5ML suspension Take 10-20 mLs by mouth every 6 (six) hours as needed for indigestion or heartburn.   Yes Historical Provider, MD  anti-nausea (EMETROL) solution Take 15-30 mLs by mouth every 15 (fifteen) minutes as needed for nausea or vomiting.   Yes Historical Provider, MD  benzonatate (TESSALON) 100 MG capsule Take 200 mg by mouth 3 (three) times daily as needed for cough.   Yes Historical Provider, MD  dextromethorphan-guaiFENesin (MUCINEX DM) 30-600 MG 12hr tablet Take 1 tablet by mouth 2 (two) times daily as needed (for congestion).   Yes Historical Provider, MD  loperamide (IMODIUM A-D) 2 MG tablet Take 2-4 mg by mouth 4 (four) times daily as needed for diarrhea or loose stools.   Yes Historical Provider, MD  Multiple Vitamin (MULTIVITAMIN WITH MINERALS) TABS tablet Take 1 tablet by mouth daily.   Yes Historical Provider, MD  phenol (CHLORASEPTIC) 1.4 % LIQD Use as directed 1 spray in the mouth or throat every 4 (four) hours as needed for throat irritation / pain.   Yes Historical Provider, MD  thiamine (VITAMIN B-1) 100 MG tablet Take 100 mg by mouth daily.   Yes Historical Provider, MD  zonisamide  (ZONEGRAN) 100 MG capsule Take 100 mg by mouth daily.   Yes Historical Provider, MD  chlordiazePOXIDE (LIBRIUM) 5 MG capsule Take 5-25 mg by mouth as directed. 25 mg 4 times daily x 1 day, 20 mg 4 times daily x 1 day, 10 mg 4 times daily x1 day, 5mg  4 times daily x1 day.    Historical Provider, MD  levETIRAcetam (KEPPRA XR) 500 MG 24 hr tablet TAKE 2 TABLETS DAILY Patient not taking: Reported on 01/08/2016 08/09/15   Van ClinesKaren M Aquino, MD  LORazepam (ATIVAN) 1 MG tablet Take 0.5-2 mg by mouth every 8 (eight) hours. 2 mg 4 times daily for 1 day, 1.5 mg 4 times daily x 1 day, 1 mg 4 times daily x1 day,  0.5 mg 4 times daily for 1 day.    Historical Provider, MD   BP 131/97 mmHg  Pulse 102  Temp(Src) 98.6 F (37 C) (Oral)  Resp 16  SpO2 97% Physical Exam  Constitutional: He is oriented to person, place, and time. He appears well-developed and well-nourished. No distress.  HENT:  Head: Normocephalic and atraumatic.  Eyes: Pupils are equal, round, and reactive to light.  Neck: Normal range of motion.  Cardiovascular: Normal rate.   No murmur heard. Pulmonary/Chest: Effort normal and breath sounds normal. He has no wheezes. He has no rales. He exhibits no tenderness.  Abdominal: Soft. There is no tenderness. There is no guarding.  Musculoskeletal: Normal range of motion.  Neurological: He is alert and oriented to person, place, and time.  Speech clear, focused and goal directed. No deficits of coordination. He has a very mild resting tremor in UE's. No asterixis. CN's 3-12 grossly intact.   Skin: Skin is warm and dry. He is not diaphoretic.  Psychiatric: He has a normal mood and affect. Judgment normal.  Nursing note and vitals reviewed.   ED Course  Procedures DIAGNOSTIC STUDIES: Oxygen Saturation is 97% on RA, normal by my interpretation.  COORDINATION OF CARE: 12:17 AM-Will order labs. Discussed treatment plan with pt at bedside and pt agreed to plan.   Labs Review Labs Reviewed  I-STAT  CHEM 8, ED    Imaging Review No results found. I have personally reviewed and evaluated these images and lab results as part of my medical decision-making.   EKG Interpretation None      MDM   Final diagnoses:  None    1. Seizure x 1  No seizure activity in the emergency department. The patient reports he was on a "taper dose" while he has been at [facility] but cannot give details of the tapering medication, which ended this morning. The patient has requested that I do not contact [facility] for any historical details and does not give me permission to get this information.   Nurse contact with [facility]: during [facility] call [facility] the patient was taking  Keppra, which is what the patient remembered. The second call to [facility] gave a conflicting history and confirmed patient was taking Zonegran for seizures. They confirmed he was on a Tegretol taper as well as an Ativan taper, both of which ended this morning.   VSS in the ED. EKG shows NSR. Chemistries essentially normal with the exception of a mildly deficient potassium of 3.2. Discussed condition and presentation with Dr. Blinda Leatherwood who advised the patient likely had a breakthrough seizure and an additional dose of Zonegran was provided. The patient is felt stable for return to [facility] to continue his alcohol treatment.    I personally performed the services described in this documentation, which was scribed in my presence. The recorded information has been reviewed and is accurate.      Elpidio Anis, PA-C 01/09/16 1610  Gilda Crease, MD 01/09/16 [phone]

## 2016-01-08 NOTE — ED Notes (Signed)
Bed: [id number] Expected date:  Expected time:  Means of arrival:  Comments: [age] yo M  Seizure

## 2016-01-09 LAB — I-STAT CHEM 8, ED
BUN: 7 mg/dL (ref 6–20)
CHLORIDE: 108 mmol/L (ref 101–111)
CREATININE: 0.7 mg/dL (ref 0.61–1.24)
Calcium, Ion: 1.16 mmol/L (ref 1.13–1.30)
GLUCOSE: 92 mg/dL (ref 65–99)
HCT: 36 % — ABNORMAL LOW (ref 39.0–52.0)
Hemoglobin: 12.2 g/dL — ABNORMAL LOW (ref 13.0–17.0)
POTASSIUM: 3.2 mmol/L — AB (ref 3.5–5.1)
Sodium: 142 mmol/L (ref 135–145)
TCO2: 21 mmol/L (ref 0–100)

## 2016-01-09 MED ORDER — ZONISAMIDE 100 MG PO CAPS
100.0000 mg | ORAL_CAPSULE | Freq: Every day | ORAL | Status: DC
  Administered 2016-01-09: 100 mg via ORAL
  Filled 2016-01-09: qty 1

## 2016-01-09 MED ORDER — SODIUM CHLORIDE 0.9 % IV BOLUS (SEPSIS)
1000.0000 mL | Freq: Once | INTRAVENOUS | Status: AC
  Administered 2016-01-09: 1000 mL via INTRAVENOUS

## 2016-01-09 MED ORDER — LEVETIRACETAM 500 MG PO TABS
500.0000 mg | ORAL_TABLET | Freq: Once | ORAL | Status: DC
  Filled 2016-01-09: qty 1

## 2016-01-09 NOTE — Discharge Instructions (Signed)
YOU CAN BE DISCHARGED BACK TO [facility] FOR ONGOING ALCOHOL TREATMENT. CONTINUE YOUR REGULAR KEPPRA DOSING. RETURN HERE WITH ANY FURTHER BREAKTHROUGH SEIZURES OR NEW CONCERNS.

## 2016-01-09 NOTE — ED Notes (Signed)
Shari, PA at bedside. 

## 2016-01-09 NOTE — ED Notes (Signed)
Pt given discharge instructions, verbalized understanding of need to follow up with [facility], father at bedside to transport pt. Pt denies further questions or concerns. Pt ambulated to exit without difficulty.

## 2016-01-09 NOTE — ED Notes (Signed)
Tegretol taper 200 mg PO BID 01/08/16, 01/09/16 Tegretol 100 mg PO BID x 3 days, Tegretol 100 mg PO daily x 3 days. Witness seizure, did not hit head, "from sitting position laid down and began shaking" Pt also takes Seroquel 50 mg hs, Lactulose 30 ml TID x 8 days. Pt would be able to return to [facility].

## 2016-01-09 NOTE — ED Notes (Signed)
Confirmed with Steward DroneBrenda at [facility]. Pt may be discharged and transport POC with father to facility.

## 2016-03-25 ENCOUNTER — Telehealth: Payer: Self-pay | Admitting: Family Medicine

## 2016-03-25 NOTE — Telephone Encounter (Signed)
yes

## 2016-03-25 NOTE — Telephone Encounter (Signed)
Pt mother Jeanie CooksCindy Chastang, existing pt with Dr. Zola ButtonLowne-Chase, states pt needs new PCP. Can we establish?  Ok to call pt to schedule if approved.

## 2016-03-25 NOTE — Telephone Encounter (Signed)
Please advise      KP 

## 2016-04-01 ENCOUNTER — Ambulatory Visit (INDEPENDENT_AMBULATORY_CARE_PROVIDER_SITE_OTHER): Payer: BLUE CROSS/BLUE SHIELD | Admitting: Family Medicine

## 2016-04-01 ENCOUNTER — Encounter: Payer: Self-pay | Admitting: Family Medicine

## 2016-04-01 VITALS — BP 115/78 | HR 81 | Temp 99.5°F | Resp 16 | Ht 68.0 in | Wt 153.0 lb

## 2016-04-01 DIAGNOSIS — K860 Alcohol-induced chronic pancreatitis: Secondary | ICD-10-CM | POA: Insufficient documentation

## 2016-04-01 DIAGNOSIS — K709 Alcoholic liver disease, unspecified: Secondary | ICD-10-CM | POA: Diagnosis not present

## 2016-04-01 DIAGNOSIS — F411 Generalized anxiety disorder: Secondary | ICD-10-CM | POA: Diagnosis not present

## 2016-04-01 DIAGNOSIS — F4323 Adjustment disorder with mixed anxiety and depressed mood: Secondary | ICD-10-CM | POA: Diagnosis not present

## 2016-04-01 DIAGNOSIS — R569 Unspecified convulsions: Secondary | ICD-10-CM | POA: Diagnosis not present

## 2016-04-01 MED ORDER — SERTRALINE HCL 25 MG PO TABS: 25.0000 mg | ORAL_TABLET | Freq: Every day | ORAL | 1 refills | Status: DC

## 2016-04-01 MED ORDER — ZONISAMIDE 100 MG PO CAPS: 200.0000 mg | ORAL_CAPSULE | Freq: Every day | ORAL | 1 refills | Status: DC

## 2016-04-01 MED ORDER — QUETIAPINE FUMARATE 100 MG PO TABS: 100.0000 mg | ORAL_TABLET | Freq: Every day | ORAL | 1 refills | Status: DC

## 2016-04-01 NOTE — Progress Notes (Signed)
Patient ID: Tony Jennings Jennette, male    DOB: [birth year]  Age: 35 y.o. MRN: [id number]    Subjective:  Subjective  HPI Tony Jennings Ferrero presents to establish.  [facility]-- [facility] 02, 2016 --- its been 90 days since [name] and he is doing well and is going to AA 3-6 x a week and [facility].  He meets with his sponsor 2x a week.   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     Review of Systems  Constitutional: Negative for appetite change, diaphoresis, fatigue and unexpected weight change.  Eyes: Negative for pain, redness and visual disturbance.  Respiratory: Negative for cough, chest tightness, shortness of breath and wheezing.   Cardiovascular: Negative for chest pain, palpitations and leg swelling.  Endocrine: Negative for cold intolerance, heat intolerance, polydipsia, polyphagia and polyuria.  Genitourinary: Negative for difficulty urinating, dysuria and frequency.  Neurological: Negative for dizziness, light-headedness, numbness and headaches.    History Past Medical History:  Diagnosis Date  . Alcohol-induced chronic pancreatitis (HCC)   . ALD (alcoholic liver disease) (HCC)   . Anxiety   . Seizures (HCC)   . Ulnar neuropathy at elbow 10/24/2014    He has a past surgical history that includes Wrist surgery.   His family history includes Hypertension in his father and mother.He reports that he has been smoking Cigarettes.  He has a 3.00 pack-year smoking history. He has never used smokeless tobacco. He reports that he does not drink alcohol or use drugs.  Current Outpatient Prescriptions on File Prior to Visit  Medication Sig Dispense Refill  . alum & mag hydroxide-simeth (MAALOX/MYLANTA) 200-200-20 MG/5ML suspension Take 10-20 mLs by mouth every 6 (six) hours as needed for indigestion or heartburn.    . loperamide (IMODIUM A-Jennings) 2 MG tablet Take 2-4 mg by mouth 4 (four) times daily as needed for diarrhea or loose stools.    Marland Kitchen.  LORazepam (ATIVAN) 1 MG tablet Take 0.5-2 mg by mouth every 8 (eight) hours. 2 mg 4 times daily for 1 day, 1.5 mg 4 times daily x 1 day, 1 mg 4 times daily x1 day,  0.5 mg 4 times daily for 1 day.    . Multiple Vitamin (MULTIVITAMIN WITH MINERALS) TABS tablet Take 1 tablet by mouth daily.    . phenol (CHLORASEPTIC) 1.4 % LIQD Use as directed 1 spray in the mouth or throat every 4 (four) hours as needed for throat irritation / pain.    Marland Kitchen. thiamine (VITAMIN B-1) 100 MG tablet Take 100 mg by mouth daily.     No current facility-administered medications on file prior to visit.      Objective:  Objective  Physical Exam  Constitutional: He is oriented to person, place, and time. Vital signs are normal. He appears well-developed and well-nourished. He is sleeping.  HENT:  Head: Normocephalic and atraumatic.  Mouth/Throat: Oropharynx is clear and moist.  Eyes: EOM are normal. Pupils are equal, round, and reactive to light.  Neck: Normal range of motion. Neck supple. No thyromegaly present.  Cardiovascular: Normal rate and regular rhythm.   No murmur heard. Pulmonary/Chest: Effort normal and breath sounds normal. No respiratory distress. He has no wheezes. He has no rales. He exhibits no tenderness.  Musculoskeletal: He exhibits no edema or tenderness.  Neurological: He is alert and oriented to person, place, and time.  Skin: Skin is warm and dry.  Psychiatric: He has a normal mood and affect. His  behavior is normal. Judgment and thought content normal.  Nursing note and vitals reviewed.  BP 115/78 (BP Location: Left Arm, Patient Position: Sitting, Cuff Size: Normal)   Pulse 81   Temp 99.5 F (37.5 C) (Oral)   Resp 16   Ht 5\' 8"  (1.727 m)   Wt 153 lb (69.4 kg)   SpO2 100%   BMI 23.26 kg/m  Wt Readings from Last 3 Encounters:  04/01/16 153 lb (69.4 kg)  01/13/15 146 lb (66.2 kg)  11/21/14 145 lb (65.8 kg)     Lab Results  Component Value Date   HGB 12.2 (L) 01/09/2016   HCT 36.0  (L) 01/09/2016   GLUCOSE 92 01/09/2016   NA 142 01/09/2016   K 3.2 (L) 01/09/2016   CL 108 01/09/2016   CREATININE 0.70 01/09/2016   BUN 7 01/09/2016    No results found.   Assessment & Plan:  Plan  I have discontinued Mr. Nanninga's levETIRAcetam, anti-nausea, chlordiazePOXIDE, dextromethorphan-guaiFENesin, and benzonatate. I have also changed his zonisamide, sertraline, and QUEtiapine. Additionally, I am having him maintain his thiamine, multivitamin with minerals, LORazepam, alum & mag hydroxide-simeth, phenol, loperamide, and gabapentin.  Meds ordered this encounter  Medications  . gabapentin (NEURONTIN) 300 MG capsule    Sig: Take 300 mg by mouth 3 (three) times daily.  Marland Kitchen DISCONTD: sertraline (ZOLOFT) 25 MG tablet    Sig: Take 25 mg by mouth daily.  Marland Kitchen DISCONTD: QUEtiapine (SEROQUEL) 100 MG tablet    Sig: Take 100 mg by mouth at bedtime.  Marland Kitchen zonisamide (ZONEGRAN) 100 MG capsule    Sig: Take 2 capsules (200 mg total) by mouth daily.    Dispense:  180 capsule    Refill:  1  . sertraline (ZOLOFT) 25 MG tablet    Sig: Take 1 tablet (25 mg total) by mouth daily.    Dispense:  90 tablet    Refill:  1  . QUEtiapine (SEROQUEL) 100 MG tablet    Sig: Take 1 tablet (100 mg total) by mouth at bedtime.    Dispense:  90 tablet    Refill:  1    Problem List Items Addressed This Visit      Unprioritized   Generalized anxiety disorder   Relevant Medications   sertraline (ZOLOFT) 25 MG tablet   QUEtiapine (SEROQUEL) 100 MG tablet   Seizures (HCC) - Primary   Relevant Medications   gabapentin (NEURONTIN) 300 MG capsule   zonisamide (ZONEGRAN) 100 MG capsule   Alcohol-induced chronic pancreatitis (HCC)   Relevant Orders   Ambulatory referral to Gastroenterology   ALD (alcoholic liver disease) (HCC)   Relevant Orders   Ambulatory referral to Gastroenterology    Other Visit Diagnoses    Adjustment disorder with mixed anxiety and depressed mood       Relevant Medications    sertraline (ZOLOFT) 25 MG tablet   QUEtiapine (SEROQUEL) 100 MG tablet    renewed pt meds and advised that he call a psych for f/u We will get him into GI   Follow-up: Return in about 3 months (around 07/01/2016) for annual exam, fasting.  Donato Schultz, DO

## 2016-04-01 NOTE — Patient Instructions (Signed)
Alcoholic Liver Disease  The liver is an organ that converts food into energy, absorbs vitamins from food, removes toxins from the blood, and makes proteins. Alcoholic liver disease happens when the liver becomes damaged due to alcohol consumption and it stops working properly.  CAUSES  This condition is caused by drinking too much alcohol over a number of years.  RISK FACTORS  This condition is more likely to develop in:  · Women.  · People who have a family history of the disease.  · People who have poor nutrition.  · People who are obese.  SYMPTOMS  Early symptoms of this condition include:  · Fatigue.  · Weakness.  · Abdominal discomfort on the upper right side.  Symptoms of moderate disease include:  · Fever.  · Yellow, pale, or darkening skin.  · Abdominal pain.  · Nausea and vomiting.  · Weight loss.  · Loss of appetite.  Symptoms of advanced disease include:  · Abdominal swelling.  · Nosebleeds or bleeding gums.  · Itchy skin.  · Enlarged fingertips (clubbing).  · Light-colored stools that smell very bad (steatorrhea).  · Mood changes.  · Feeling agitated.  · Confusion.  · Trouble concentrating.  Some people do not have symptoms until the condition becomes severe. Symptoms are often worse after heavy drinking.  DIAGNOSIS  This condition is diagnosed with:  · A physical exam.  · Blood tests.  · Taking a sample of liver tissue to examine under a microscope (liver biopsy).  You may also have other tests, including:   · X-rays.  · Ultrasound.  TREATMENT  Treatment may include:  · Stopping alcohol use to allow the liver to heal.  · Joining a support group or meeting with a counselor.  · Medicines to reduce inflammation. These may be recommended if the disease is severe.  · A liver transplant. This is the only treatment if the disease is very severe.  · Nutritional therapy. This may involve:    Taking vitamins.    Eating foods that are high in thiamine, such as whole-wheat cereals, pork, and raw vegetables.    Eating foods that have a lot of folic acid, such as vegetables, fruits, meats, beans, nuts, and dairy foods.    Eating a diet that includes carbohydrate-rich foods, such as yogurt, beans, potatoes, and rice.  HOME CARE INSTRUCTIONS  · Do not drink alcohol.  · Take medicines only as directed by your health care provider.  · Take vitamins only as directed by your health care provider.  · Follow any diet instructions that are given to you by your health care provider.  SEEK MEDICAL CARE IF:  · You have a fever.  · You have shortness of breath or have difficulty breathing.  · You have bright red blood in your stool, or you have black, tarry stools.  · You are vomiting blood.  · Your skin color becomes more yellow, pale, or dark.  · You develop headaches.  · You have trouble thinking.  · You have problems balancing or walking.     This information is not intended to replace advice given to you by your health care provider. Make sure you discuss any questions you have with your health care provider.     Document Released: 07/15/2014 Document Reviewed: 07/15/2014  Elsevier Interactive Patient Education ©2016 Elsevier Inc[employer]

## 2016-04-01 NOTE — Progress Notes (Signed)
Pre visit review using our clinic review tool, if applicable. No additional management support is needed unless otherwise documented below in the visit note. 

## 2016-05-01 ENCOUNTER — Encounter: Payer: Self-pay | Admitting: Internal Medicine

## 2016-05-06 ENCOUNTER — Encounter: Payer: Self-pay | Admitting: *Deleted

## 2016-05-08 ENCOUNTER — Ambulatory Visit: Payer: Self-pay | Admitting: Internal Medicine

## 2016-05-09 ENCOUNTER — Telehealth: Payer: Self-pay | Admitting: *Deleted

## 2016-05-09 NOTE — Telephone Encounter (Signed)
No show letter mailed to patient. 

## 2016-05-24 ENCOUNTER — Telehealth: Payer: Self-pay

## 2016-05-27 ENCOUNTER — Telehealth: Payer: Self-pay | Admitting: Family Medicine

## 2016-05-27 ENCOUNTER — Ambulatory Visit (INDEPENDENT_AMBULATORY_CARE_PROVIDER_SITE_OTHER): Payer: BLUE CROSS/BLUE SHIELD | Admitting: Family Medicine

## 2016-05-27 ENCOUNTER — Encounter: Payer: Self-pay | Admitting: Family Medicine

## 2016-05-27 VITALS — BP 113/80 | HR 91 | Temp 98.3°F | Ht 68.0 in | Wt 153.4 lb

## 2016-05-27 DIAGNOSIS — G40009 Localization-related (focal) (partial) idiopathic epilepsy and epileptic syndromes with seizures of localized onset, not intractable, without status epilepticus: Secondary | ICD-10-CM

## 2016-05-27 DIAGNOSIS — K709 Alcoholic liver disease, unspecified: Secondary | ICD-10-CM | POA: Diagnosis not present

## 2016-05-27 NOTE — Telephone Encounter (Signed)
Relation to ZO:XWRUpt:self Call back number:[phone]   Reason for call:  Patient was seen today and patient would like to know when he should return, patient has 07/04/16 appointment scheduled already, unsure if appointment should be pushed out, please advise

## 2016-05-27 NOTE — Assessment & Plan Note (Signed)
Not since he quit drinking and went fhrough rehab

## 2016-05-27 NOTE — Progress Notes (Signed)
Pre visit review using our clinic tool,if applicable. No additional management support is needed unless otherwise documented below in the visit note.  

## 2016-05-27 NOTE — Progress Notes (Signed)
Patient ID: Tony Jennings, male    DOB: 05/30/1981  Age: 35 y.o. MRN: [id number]    Subjective:  Subjective  HPI Tony Jennings presents for f/u liver function and pancreatitis.  He is still going to AA still needs a psych appointment.  Pt with no complaints today.   Review of Systems  Constitutional: Negative for appetite change, diaphoresis, fatigue and unexpected weight change.  Eyes: Negative for pain, redness and visual disturbance.  Respiratory: Negative for cough, chest tightness, shortness of breath and wheezing.   Cardiovascular: Negative for chest pain, palpitations and leg swelling.  Gastrointestinal: Negative for abdominal distention, abdominal pain, blood in stool, diarrhea, nausea and vomiting.  Endocrine: Negative for cold intolerance, heat intolerance, polydipsia, polyphagia and polyuria.  Genitourinary: Negative for difficulty urinating, dysuria and frequency.  Neurological: Negative for dizziness, light-headedness, numbness and headaches.    History Past Medical History:  Diagnosis Date  . Acute hypokalemia   . Acute hyponatremia   . Alcohol abuse   . Alcohol-induced chronic pancreatitis (HCC)   . ALD (alcoholic liver disease) (HCC)   . Anxiety   . Hepatic steatosis   . Seizures (HCC)   . Ulnar neuropathy at elbow 10/24/2014    He has a past surgical history that includes Wrist surgery.   His family history includes Hypertension in his father and mother.He reports that he has been smoking Cigarettes.  He has a 3.00 pack-year smoking history. He has never used smokeless tobacco. He reports that he does not drink alcohol or use drugs.  Current Outpatient Prescriptions on File Prior to Visit  Medication Sig Dispense Refill  . Multiple Vitamin (MULTIVITAMIN WITH MINERALS) TABS tablet Take 1 tablet by mouth daily.    . QUEtiapine (SEROQUEL) 100 MG tablet Take 1 tablet (100 mg total) by mouth at bedtime. 90 tablet 1  . sertraline (ZOLOFT) 25 MG tablet Take 1  tablet (25 mg total) by mouth daily. 90 tablet 1  . thiamine (VITAMIN B-1) 100 MG tablet Take 100 mg by mouth daily.    Marland Kitchen. zonisamide (ZONEGRAN) 100 MG capsule Take 2 capsules (200 mg total) by mouth daily. 180 capsule 1   No current facility-administered medications on file prior to visit.      Objective:  Objective  Physical Exam  Constitutional: He is oriented to person, place, and time. Vital signs are normal. He appears well-developed and well-nourished. He is sleeping.  HENT:  Head: Normocephalic and atraumatic.  Mouth/Throat: Oropharynx is clear and moist.  Eyes: EOM are normal. Pupils are equal, round, and reactive to light.  Neck: Normal range of motion. Neck supple. No thyromegaly present.  Cardiovascular: Normal rate and regular rhythm.   No murmur heard. Pulmonary/Chest: Effort normal and breath sounds normal. No respiratory distress. He has no wheezes. He has no rales. He exhibits no tenderness.  Abdominal: Soft. Bowel sounds are normal. He exhibits no distension and no mass. There is no tenderness. There is no rebound and no guarding.  Musculoskeletal: He exhibits no edema or tenderness.  Neurological: He is alert and oriented to person, place, and time.  Skin: Skin is warm and dry.  Psychiatric: He has a normal mood and affect. His behavior is normal. Judgment and thought content normal.  Nursing note and vitals reviewed.  BP 113/80   Pulse 91   Temp 98.3 F (36.8 C) (Oral)   Ht 5\' 8"  (1.727 m)   Wt 153 lb 6.4 oz (69.6 kg)   SpO2 100%  BMI 23.32 kg/m  Wt Readings from Last 3 Encounters:  05/27/16 153 lb 6.4 oz (69.6 kg)  04/01/16 153 lb (69.4 kg)  01/13/15 146 lb (66.2 kg)     Lab Results  Component Value Date   HGB 12.2 (L) 01/09/2016   HCT 36.0 (L) 01/09/2016   GLUCOSE 92 01/09/2016   NA 142 01/09/2016   K 3.2 (L) 01/09/2016   CL 108 01/09/2016   CREATININE 0.70 01/09/2016   BUN 7 01/09/2016    No results found.   Assessment & Plan:  Plan  I  have discontinued [name]'s LORazepam, alum & mag hydroxide-simeth, phenol, loperamide, and gabapentin. I am also having him maintain his thiamine, multivitamin with minerals, zonisamide, sertraline, and QUEtiapine.  No orders of the defined types were placed in this encounter.   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  Donato SchultzYvonne R Lowne Chase, DO

## 2016-05-27 NOTE — Patient Instructions (Signed)
Alcohol Abuse and Nutrition °Alcohol abuse is any pattern of alcohol consumption that harms your health, relationships, or work. Alcohol abuse can affect how your body breaks down and absorbs nutrients from food by causing your liver to work abnormally. Additionally, many people who abuse alcohol do not eat enough carbohydrates, protein, fat, vitamins, and minerals. This can cause poor nutrition (malnutrition) and a lack of nutrients (nutrient deficiencies), which can lead to further complications. °Nutrients that are commonly lacking (deficient) among people who abuse alcohol include: °· Vitamins. °¨ Vitamin A. This is stored in your liver. It is important for your vision, metabolism, and ability to fight off infections (immunity). °¨ B vitamins. These include vitamins such as folate, thiamin, and niacin. These are important in new cell growth and maintenance. °¨ Vitamin C. This plays an important role in iron absorption, wound healing, and immunity. °¨ Vitamin D. This is produced by your liver, but you can also get vitamin D from food. Vitamin D is necessary for your body to absorb and use calcium. °· Minerals. °¨ Calcium. This is important for your bones and your heart and blood vessel (cardiovascular) function. °¨ Iron. This is important for blood, muscle, and nervous system functioning. °¨ Magnesium. This plays an important role in muscle and nerve function, and it helps to control blood sugar and blood pressure. °¨ Zinc. This is important for the normal function of your nervous system and digestive system (gastrointestinal tract). °Nutrition is an essential component of therapy for alcohol abuse. Your health care provider or dietitian will work with you to design a plan that can help restore nutrients to your body and prevent potential complications. °What is my plan? °Your dietitian may develop a specific diet plan that is based on your condition and any other complications you may have. A diet plan will  commonly include: °· A balanced diet. °¨ Grains: 6-8 oz per day. °¨ Vegetables: 2-3 cups per day. °¨ Fruits: 1-2 cups per day. °¨ Meat and other protein: 5-6 oz per day. °¨ Dairy: 2-3 cups per day. °· Vitamin and mineral supplements. °What do I need to know about alcohol and nutrition? °· Consume foods that are high in antioxidants, such as grapes, berries, nuts, green tea, and dark green and orange vegetables. This can help to counteract some of the stress that is placed on your liver by consuming alcohol. °· Avoid food and drinks that are high in fat and sugar. Foods such as sugared soft drinks, salty snack foods, and candy contain empty calories. This means that they lack important nutrients such as protein, fiber, and vitamins. °· Eat frequent meals and snacks. Try to eat 5-6 small meals each day. °· Eat a variety of fresh fruits and vegetables each day. This will help you get plenty of water, fiber, and vitamins in your diet. °· Drink plenty of water and other clear fluids. Try to drink at least 48-64 oz (1.5-2 L) of water per day. °· If you are a vegetarian, eat a variety of protein-rich foods. Pair whole grains with plant-based proteins at meals and snacks to obtain the greatest nutrient benefit from your food. For example, eat rice with beans, put peanut butter on whole-grain toast, or eat oatmeal with sunflower seeds. °· Soak beans and whole grains overnight before cooking. This can help your body to absorb the nutrients more easily. °· Include foods fortified with vitamins and minerals in your diet. Commonly fortified foods include milk, orange juice, cereal, and bread. °· If you   are malnourished, your dietitian may recommend a high-protein, high-calorie diet. This may include: °¨ 2,000-3,000 calories (kilocalories) per day. °¨ 70-100 grams of protein per day. °· Your health care provider may recommend a complete nutritional supplement beverage. This can help to restore calories, protein, and vitamins to  your body. Depending on your condition, you may be advised to consume this instead of or in addition to meals. °· Limit your intake of caffeine. Replace drinks like coffee and black tea with decaffeinated coffee and herbal tea. °· Eat a variety of foods that are high in omega fatty acids. These include fish, nuts and seeds, and soybeans. These foods may help your liver to recover and may also stabilize your mood. °· Certain medicines may cause changes in your appetite, taste, and weight. Work with your health care provider and dietitian to make any adjustments to your medicines and diet plan. °· Include other healthy lifestyle choices in your daily routine. °¨ Be physically active. °¨ Get enough sleep. °¨ Spend time doing activities that you enjoy. °· If you are unable to take in enough food and calories by mouth, your health care provider may recommend a feeding tube. This is a tube that passes through your nose and throat, directly into your stomach. Nutritional supplement beverages can be given to you through the feeding tube to help you get the nutrients you need. °· Take vitamin or mineral supplements as recommended by your health care provider. °What foods can I eat? °Grains  °Enriched pasta. Enriched rice. Fortified whole-grain bread. Fortified whole-grain cereal. Barley. Brown rice. Quinoa. Millet. °Vegetables  °All fresh, frozen, and canned vegetables. Spinach. Kale. Artichoke. Carrots. Winter squash and pumpkin. Sweet potatoes. Broccoli. Cabbage. Cucumbers. Tomatoes. Sweet peppers. Green beans. Peas. Corn. °Fruits  °All fresh and frozen fruits. Berries. Grapes. Mango. Papaya. Guava. Cherries. Apples. Bananas. Peaches. Plums. Pineapple. Watermelon. Cantaloupe. Oranges. Avocado. °Meats and Other Protein Sources  °Beef liver. Lean beef. Pork. Fresh and canned chicken. Fresh fish. Oysters. Sardines. Canned tuna. Shrimp. Eggs with yolks. Nuts and seeds. Peanut butter. Beans and lentils. Soybeans. Tofu. °Dairy    °Whole, low-fat, and nonfat milk. Whole, low-fat, and nonfat yogurt. Cottage cheese. Sour cream. Hard and soft cheeses. °Beverages  °Water. Herbal tea. Decaffeinated coffee. Decaffeinated green tea. 100% fruit juice. 100% vegetable juice. Instant breakfast shakes. °Condiments  °Ketchup. Mayonnaise. Mustard. Salad dressing. Barbecue sauce. °Sweets and Desserts  °Sugar-free ice cream. Sugar-free pudding. Sugar-free gelatin. °Fats and Oils  °Butter. Vegetable oil, flaxseed oil, olive oil, and walnut oil. °Other  °Complete nutrition shakes. Protein bars. Sugar-free gum. °The items listed above may not be a complete list of recommended foods or beverages. Contact your dietitian for more options.  °What foods are not recommended? °Grains  °Sugar-sweetened breakfast cereals. Flavored instant oatmeal. Fried breads. °Vegetables  °Breaded or deep-fried vegetables. °Fruits  °Dried fruit with added sugar. Candied fruit. Canned fruit in syrup. °Meats and Other Protein Sources  °Breaded or deep-fried meats. °Dairy  °Flavored milks. Fried cheese curds or fried cheese sticks. °Beverages  °Alcohol. Sugar-sweetened soft drinks. Sugar-sweetened tea. Caffeinated coffee and tea. °Condiments  °Sugar. Honey. Agave nectar. Molasses. °Sweets and Desserts  °Chocolate. Cake. Cookies. Candy. °Other  °Potato chips. Pretzels. Salted nuts. Candied nuts. °The items listed above may not be a complete list of foods and beverages to avoid. Contact your dietitian for more information.  °This information is not intended to replace advice given to you by your health care provider. Make sure you discuss any questions you   have with your health care provider. °Document Released: 04/18/2005 Document Revised: 11/01/2015 Document Reviewed: 01/25/2014 °Elsevier Interactive Patient Education © 2017 [employer] ° °

## 2016-05-27 NOTE — Assessment & Plan Note (Signed)
Recheck labs today. 

## 2016-05-28 LAB — COMPREHENSIVE METABOLIC PANEL
ALBUMIN: 4.5 g/dL (ref 3.5–5.2)
ALK PHOS: 74 U/L (ref 39–117)
ALT: 13 U/L (ref 0–53)
AST: 16 U/L (ref 0–37)
BUN: 13 mg/dL (ref 6–23)
CALCIUM: 9.5 mg/dL (ref 8.4–10.5)
CHLORIDE: 108 meq/L (ref 96–112)
CO2: 26 mEq/L (ref 19–32)
CREATININE: 0.92 mg/dL (ref 0.40–1.50)
GFR: 98.56 mL/min (ref >60.00)
Glucose, Bld: 87 mg/dL (ref 70–99)
POTASSIUM: 4.2 meq/L (ref 3.5–5.1)
SODIUM: 141 meq/L (ref 135–145)
TOTAL PROTEIN: 6.9 g/dL (ref 6.0–8.3)
Total Bilirubin: 0.2 mg/dL (ref 0.2–1.2)

## 2016-07-04 ENCOUNTER — Ambulatory Visit: Payer: Self-pay | Admitting: Family Medicine

## 2016-07-30 ENCOUNTER — Ambulatory Visit: Payer: Self-pay | Admitting: Internal Medicine

## 2016-09-28 ENCOUNTER — Other Ambulatory Visit: Payer: Self-pay | Admitting: Family Medicine

## 2016-09-28 DIAGNOSIS — F4323 Adjustment disorder with mixed anxiety and depressed mood: Secondary | ICD-10-CM

## 2016-09-28 DIAGNOSIS — F411 Generalized anxiety disorder: Secondary | ICD-10-CM

## 2016-10-31 ENCOUNTER — Other Ambulatory Visit: Payer: Self-pay | Admitting: Family Medicine

## 2016-10-31 DIAGNOSIS — R569 Unspecified convulsions: Secondary | ICD-10-CM

## 2016-12-16 ENCOUNTER — Ambulatory Visit (INDEPENDENT_AMBULATORY_CARE_PROVIDER_SITE_OTHER): Payer: 59 | Admitting: Psychiatry

## 2016-12-16 ENCOUNTER — Encounter (HOSPITAL_COMMUNITY): Payer: Self-pay | Admitting: Psychiatry

## 2016-12-16 VITALS — BP 130/80 | HR 124 | Resp 18 | Ht 68.0 in | Wt 145.0 lb

## 2016-12-16 DIAGNOSIS — F0630 Mood disorder due to known physiological condition, unspecified: Secondary | ICD-10-CM

## 2016-12-16 DIAGNOSIS — F411 Generalized anxiety disorder: Secondary | ICD-10-CM | POA: Diagnosis not present

## 2016-12-16 DIAGNOSIS — F39 Unspecified mood [affective] disorder: Secondary | ICD-10-CM | POA: Diagnosis not present

## 2016-12-16 DIAGNOSIS — F1021 Alcohol dependence, in remission: Secondary | ICD-10-CM | POA: Diagnosis not present

## 2016-12-16 DIAGNOSIS — F17210 Nicotine dependence, cigarettes, uncomplicated: Secondary | ICD-10-CM

## 2016-12-16 DIAGNOSIS — Z79899 Other long term (current) drug therapy: Secondary | ICD-10-CM | POA: Diagnosis not present

## 2016-12-16 MED ORDER — GABAPENTIN 100 MG PO CAPS: 200.0000 mg | ORAL_CAPSULE | Freq: Two times a day (BID) | ORAL | 0 refills | Status: DC

## 2016-12-16 MED ORDER — BUSPIRONE HCL 7.5 MG PO TABS: 7.5000 mg | ORAL_TABLET | Freq: Two times a day (BID) | ORAL | 0 refills | Status: DC | PRN

## 2016-12-16 NOTE — Progress Notes (Addendum)
Psychiatric Initial Adult Assessment   Patient Identification: Tony Jennings MRN:  [id number] Date of Evaluation:  12/16/2016 Referral Source: primary care Chief Complaint:   Chief Complaint    Establish Care     Visit Diagnosis:    ICD-10-CM   1. Mood disorder in conditions classified elsewhere F06.30   2. Generalized anxiety disorder F41.1   3. Alcohol use disorder, moderate, in early remission (HCC) F10.21     History of Present Illness:  36 years old currently single Caucasian male living with his girlfriend. Self-employed. Referred by primary care physician for management of anxiety and mood symptoms  Patient is experiencing depression decreased interest excessive anxiety at times panic attacks feeling of hopelessness this. At times but not suicidal this has gone worse for the last 3-4 weeks. Currently he is on gabapentin he is taking during the day. He has sleep disturbance at night. He was on Seroquel that was making him too sedate. Seroquel did help the voices calmed down he used to hear that one year ago Denies current paranoia or hearing voices. Endorses anxiety more so around people and increase when stressed with free floating anxiety He worries, excessive at times he has a supportive girlfriend does not have any kids.  Aggravating factors; relocation 3 years ago laid off from his job 3 years ago. History of difficult childhood especially related to church group Modifying factors; his best friend his current business's dog's his girlfriend Alcohol use : excessive till one year ago. History of pancreatitis. Used heavy amounts prior to that. Denies craving as of now  Severity of depression is 4 out of 10. 10 being no depression  He feels intensity from child attended other worries related to past has gone more flared up since he been daily feeling down  He has been on Lexapro or Wellbutrin did not help cervical rhythm to state he feels that he cycles sometimes in the same  day he is feeling hyped up increased energy and then he after a few hours of feeling dysphoric sad and decreased energy    Associated Signs/Symptoms: Depression Symptoms:  anhedonia, feelings of worthlessness/guilt, difficulty concentrating, anxiety, panic attacks, loss of energy/fatigue, (Hypo) Manic Symptoms:  Distractibility, Labiality of Mood, Anxiety Symptoms:  Excessive Worry, Panic Symptoms, Psychotic Symptoms:  denies currently PTSD Symptoms: Had a traumatic exposure:  difficult childhood time with church grou[ Hypervigilance:  Yes Hyperarousal:  Emotional Numbness/Detachment Sleep  Past Psychiatric History: depression, anxiety  Previous Psychotropic Medications: Yes   Substance Abuse History in the last 12 months:  Yes.    Consequences of Substance Abuse: Medical Consequences:  depression, mood swings. history of pancreatitits  Past Medical History:  Past Medical History:  Diagnosis Date  . Acute hypokalemia   . Acute hyponatremia   . Alcohol abuse   . Alcohol-induced chronic pancreatitis (HCC)   . ALD (alcoholic liver disease) (HCC)   . Anxiety   . Hepatic steatosis   . Seizures (HCC)   . 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 WRIST SURGERY      Family Psychiatric History: Patient grew up with his parents he was involved in a church group that according to him was reviewed and had difficult time there as he did finally at age 61. He had difficult time because his other family members did not believe his parents to leave so it felt disconnected and complex in the family dynamics    Family History:  Family History  Problem Relation Age of Onset  . Hypertension Mother   . Hypertension Father     Social History:   Social History   Social History  . Marital status: Divorced    Spouse name: N/A  . Number of children: N/A  . Years of education: N/A   Occupational History  . Risk analyst     Social History Main Topics  . Smoking status: Current Every Day Smoker    Packs/day: 1.00    Years: 10.00    Types: Cigarettes  . Smokeless tobacco: Never Used  . Alcohol use No     Comment: Drank heavily for years in past.  Recenlty drinking more with anxiety.   . Drug use: No     Comment: prior use marijuana, snorted ritalin, rare cocain. (But this was when 36 yo)  . Sexual activity: Yes    Partners: Female   Other Topics Concern  . None   Social History Narrative  . 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 gabapentin (NEURONTIN) 100 MG capsule Take 2 capsules (200 mg total) by mouth 2 (two) times daily. 90 capsule 0  . Multiple Vitamin (MULTIVITAMIN WITH MINERALS) TABS tablet Take 1 tablet by mouth daily.    Marland Kitchen thiamine (VITAMIN B-1) 100 MG tablet Take 100 mg by mouth daily.    . busPIRone (BUSPAR) 7.5 MG tablet Take 1 tablet (7.5 mg total) by mouth 2 (two) times daily as needed. 45 tablet 0  . QUEtiapine (SEROQUEL) 100 MG tablet Take 1 tablet (100 mg total) by mouth at bedtime. (Patient not taking: Reported on 12/16/2016) 90 tablet 1   No current facility-administered medications for this visit.     Neurologic: Headache: No Seizure: No Paresthesias:Yes  Musculoskeletal: Strength & Muscle Tone: within normal limits Gait & Station: normal Patient leans: no lean  Psychiatric Specialty Exam: Review of Systems  Constitutional: Negative for fever.  Skin: Negative for rash.  Psychiatric/Behavioral: Positive for  depression. The patient is nervous/anxious.     Blood pressure 130/80, pulse (!) 124, resp. rate 18, height 5\' 8"  (1.727 m), weight 145 lb (65.8 kg), SpO2 96 %.Body mass index is 22.05 kg/m.  General Appearance: Casual  Eye Contact:  Fair  Speech:  Normal Rate  Volume:  Decreased  Mood:  Anxious and Depressed  Affect:  Congruent and Depressed  Thought Process:  Goal Directed  Orientation:  Full (Time, Place, and Person)  Thought Content:  Rumination  Suicidal Thoughts:  No  Homicidal Thoughts:  No  Memory:  Immediate;   Fair Recent;   Fair  Judgement:  Fair  Insight:  Shallow  Psychomotor Activity:  Normal  Concentration:  Concentration: Fair and Attention Span: Fair  Recall:  [employer] of Knowledge:Fair  Language: Good  Akathisia:  Negative  Handed:  Right  AIMS (if indicated):    Assets:  Desire for Improvement  ADL's:  Intact  Cognition: WNL  Sleep:  variable    Treatment Plan Summary: Medication management and Plan as  follows   1. Mood disorder : no clear mania episode but has fluctuating moods. Will increase neurontin to 200mg  bid . He was taking during day only. Also to aid his sleep at night 2. GAD; add buspirone 7.5mg  qd after few days can take bid Checked pulse again no 115. Says he always have high pulse when he visits doctors. Says his primary care calls his "white coat syndrome" no palpitations or chest pain  3. Alcohol use: severe in remission: discussed relapse prevention 4. Nicotinue use: says will be ready to quit soon. Advised to cut down at night so sleep improve Reviewed sleep hygiene.  Possible PTSD as past trauma is felt more since he has been depressed Will consider ssri but need mood stabilizer to be increased and evaluate he is tolerating it  Recommend therapy.  Fu 3-4 weeks or early, he understands he can come in early or call 911 in case of emergency or suicidal thoughts    Thresa RossAKHTAR, Tim Corriher, MD 6/11/20182:32 PM

## 2016-12-27 ENCOUNTER — Encounter: Payer: Self-pay | Admitting: Emergency Medicine

## 2016-12-27 ENCOUNTER — Emergency Department (INDEPENDENT_AMBULATORY_CARE_PROVIDER_SITE_OTHER)
Admission: EM | Admit: 2016-12-27 | Discharge: 2016-12-27 | Disposition: A | Discharge status: Home / Self Care | Payer: 59 | Source: Home / Self Care | Attending: Family Medicine | Admitting: Family Medicine

## 2016-12-27 DIAGNOSIS — T887XXA Unspecified adverse effect of drug or medicament, initial encounter: Secondary | ICD-10-CM | POA: Diagnosis not present

## 2016-12-27 DIAGNOSIS — Z8669 Personal history of other diseases of the nervous system and sense organs: Secondary | ICD-10-CM

## 2016-12-27 DIAGNOSIS — Z8659 Personal history of other mental and behavioral disorders: Secondary | ICD-10-CM

## 2016-12-27 DIAGNOSIS — T50905A Adverse effect of unspecified drugs, medicaments and biological substances, initial encounter: Secondary | ICD-10-CM

## 2016-12-27 LAB — POCT CBC W AUTO DIFF (K'VILLE URGENT CARE)

## 2016-12-27 LAB — COMPLETE METABOLIC PANEL WITH GFR
ALBUMIN: 4.1 g/dL (ref 3.6–5.1)
ALK PHOS: 104 U/L (ref 40–115)
ALT: 69 U/L — ABNORMAL HIGH (ref 9–46)
AST: 157 U/L — AB (ref 10–40)
BUN: 11 mg/dL (ref 7–25)
CHLORIDE: 104 mmol/L (ref 98–110)
CO2: 19 mmol/L — AB (ref 20–31)
Calcium: 8.5 mg/dL — ABNORMAL LOW (ref 8.6–10.3)
Creat: 0.76 mg/dL (ref 0.60–1.35)
GFR, Est African American: >89 mL/min (ref >=60)
GLUCOSE: 76 mg/dL (ref 65–99)
POTASSIUM: 3.5 mmol/L (ref 3.5–5.3)
SODIUM: 139 mmol/L (ref 135–146)
Total Bilirubin: 0.4 mg/dL (ref 0.2–1.2)
Total Protein: 6.7 g/dL (ref 6.1–8.1)

## 2016-12-27 LAB — POCT FASTING CBG KUC MANUAL ENTRY: POCT Glucose (KUC): 91 mg/dL (ref 70–99)

## 2016-12-27 NOTE — ED Triage Notes (Signed)
LOC last night, hit forehead, urinated on himself, said he was out for 15-20 minutes. Just started taking Buspirone about 1 week ago. Feels dizzy and weak.

## 2016-12-29 ENCOUNTER — Telehealth: Payer: Self-pay | Admitting: Emergency Medicine

## 2016-12-29 NOTE — Telephone Encounter (Signed)
Patient informed of CMP results. Advised to call back with questions

## 2016-12-29 NOTE — Discharge Instructions (Addendum)
Decrease dose of buspirone 7.5mg  to one tab daily.  Eat meals on a regular schedule. If symptoms become significantly worse during the night or over the weekend, proceed to the local emergency room.

## 2016-12-29 NOTE — ED Provider Notes (Signed)
[facility]    CSN: [id number] Arrival date & time: 12/27/16  1116     History   Chief Complaint Chief Complaint  Patient presents with  . Fall    HPI Tony Jennings is a 36 y.o. male.   Patient reports that he has a history of anxiety and seizure disorder.  About a week ago his psychiatrist increased his dose of gabapentin from 200mg  daily to 400mg  daily (100mg  am and 300mg  pm).  He had been taking Zoloft for anxiety, which had not been adequately controlling his anxiety.  His psychiatrist also discontinued the Zoloft and began buspirone 7.5mg  daily for one week, increasing to BID yesterday.  After taking his second dose of buspirone yesterday evening, patient began to feel light-headed and somewhat dizzy.  He was on his porch and decided to lie down for a nap (he did not fall).  He awoke later discovering that he had been incontinent of urine, but felt better.  He did not have any seizure activity. Patient reports that he feels well today, but is not sure what to do because he is unable to contact his psychiatrist. Patient admits that he has been eating a diet recently consisting of large amounts of water and frequent handfuls of grapes throughout the day.   The history is provided by the patient.    Past Medical History:  Diagnosis Date  . Acute hypokalemia   . Acute hyponatremia   . Alcohol abuse   . Alcohol-induced chronic pancreatitis (HCC)   . ALD (alcoholic liver disease) (HCC)   . Anxiety   . Hepatic steatosis   . Seizures (HCC)   . Ulnar neuropathy at elbow 10/24/2014    Patient Active Problem List   Diagnosis Date Noted  . ALD (alcoholic liver disease) (HCC) 40/98/1191  . Alcohol-induced chronic pancreatitis (HCC) 04/01/2016  . 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 New onset seizure  (HCC) 10/24/2014  . Faintness 10/24/2014  . 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 Syncope 10/05/2014  . Generalized anxiety disorder 10/05/2014  . Seizures (HCC) 10/05/2014    Past Surgical History:  Procedure Laterality Date  . WRIST SURGERY         Home Medications    Prior to Admission medications   Medication Sig Start Date End Date Taking? Authorizing Provider  busPIRone (BUSPAR) 7.5 MG tablet Take 1 tablet (7.5 mg total) by mouth 2 (two) times daily as needed. 12/16/16   Thresa Ross, MD  gabapentin (NEURONTIN) 100 MG capsule Take 2 capsules (200 mg total) by mouth 2 (two) times daily. 12/16/16   Thresa Ross, MD  Multiple Vitamin (MULTIVITAMIN WITH MINERALS) TABS tablet Take 1 tablet by mouth daily.    [provider]  thiamine (VITAMIN B-1) 100 MG tablet Take 100 mg by mouth daily.    [provider]    Family History Family History  Problem Relation Age of Onset  . Hypertension Mother   . Hypertension Father     Social History Social History  Substance Use Topics  . Smoking status: Current Every Day Smoker    Packs/day: 1.00    Years: 10.00    Types: Cigarettes  . Smokeless tobacco: Never Used  . Alcohol use No     Comment: Drank heavily for years in past.  Recenlty drinking more with anxiety.      Allergies  Patient has no known allergies.   Review of Systems Review of Systems  Constitutional: Negative for activity change, appetite change, chills, diaphoresis, fatigue and fever.  HENT: Negative.   Eyes: Negative.   Respiratory: Negative.   Cardiovascular: Negative.   Gastrointestinal: Negative.   Genitourinary: Negative.   Musculoskeletal: Negative.   Skin: Negative.   Neurological: Positive for dizziness and light-headedness. Negative for tremors, seizures, syncope, facial asymmetry, speech difficulty, weakness, numbness and headaches.  Psychiatric/Behavioral: The patient is nervous/anxious.      Physical Exam Triage  Vital Signs ED Triage Vitals  Enc Vitals Group     BP 12/27/16 1138 (!) 152/93     Pulse Rate 12/27/16 1138 100     Resp --      Temp 12/27/16 1138 98.3 F (36.8 C)     Temp Source 12/27/16 1138 Oral     SpO2 12/27/16 1138 98 %     Weight 12/27/16 1138 142 lb (64.4 kg)     Height 12/27/16 1138 5\' 9"  (1.753 m)     Head Circumference --      Peak Flow --      Pain Score 12/27/16 1139 3     Pain Loc --      Pain Edu? --      Excl. in GC? --    No data found.   Updated Vital Signs BP (!) 152/93 (BP Location: Left Arm)   Pulse 100   Temp 98.3 F (36.8 C) (Oral)   Ht 5\' 9"  (1.753 m)   Wt 142 lb (64.4 kg)   SpO2 98%   BMI 20.97 kg/m   Visual Acuity Right Eye Distance:   Left Eye Distance:   Bilateral Distance:    Right Eye Near:   Left Eye Near:    Bilateral Near:     Physical Exam Nursing notes and Vital Signs reviewed. Appearance:  Patient appears stated age, and in no acute distress.  Head:  Atraumatic  Psychiatric:  Patient is alert and oriented with good eye contact.  Thoughts are organized.  No psychomotor retardation.  Memory intact.  Not suicidal  Eyes:  Pupils are equal, round, and reactive to light and accomodation.  Extraocular movement is intact.  Conjunctivae are not inflamed  Ears:  Canals normal.  Tympanic membranes normal.  Nose:   Normal turbinates.  No sinus tenderness.    Pharynx:  Normal Neck:  Supple.  No adenopathy or thyromegaly.   Lungs:  Clear to auscultation.  Breath sounds are equal.  Moving air well. Heart:  Regular rate and rhythm without murmurs, rubs, or gallops.  Rate 100 Abdomen:  Nontender without masses or hepatosplenomegaly.  Bowel sounds are present.  No CVA or flank tenderness.  Extremities:  No edema.  Skin:  No rash present.   Neurologic:  Cranial nerves 2 through 12 are normal.  Patellar, achilles, and elbow reflexes are normal.  Cerebellar function is intact (finger-to-nose and rapid alternating hand movement).  Gait and  station are normal.  Grip strength symmetric bilaterally.  UC Treatments / Results  Labs (all labs ordered are listed, but only abnormal results are displayed) Labs Reviewed  COMPLETE METABOLIC PANEL WITH GFR - Abnormal; Notable for the following:       Result Value   CO2 19 (*)    AST 157 (*)    ALT 69 (*)    Calcium 8.5 (*)    All other components within normal limits   Narrative:  Performed at:  [facility]                [address], [address]                [place], [place] [address]  POCT CBC W AUTO DIFF ([facility]):  WBC 5.6; LY 30.0; MO 8.3; GR 61.7; Hgb 13.7; Platelets 81   POCT FASTING CBG KUC MANUAL ENTRY 91    EKG  EKG Interpretation None       Radiology No results found.  Procedures Procedures (including critical care time)  Medications Ordered in UC Medications - No data to display   Initial Impression / Assessment and Plan / UC Course  I have reviewed the triage vital signs and the nursing notes.  Pertinent labs & imaging results that were available during my care of the patient were reviewed by me and considered in my medical decision making (see chart for details).    Patient has a normal exam except for pulse 100. Patient's episode of altered conscious yesterday evening does not appear to have been a seizure.  According to his history he felt dizzy and consciously stretched out on his porch floor for a nap and did not hit his head.  His symptoms appear to correlate with increasing his dose of buspirone 7.5mg  to twice daily yesterday.   His history also reveals that he has an inappropriate diet consisting of large amounts of water and frequent handfuls of grapes throughout the day.  Will check metabolic function with CMP.  Decrease dose of buspirone 7.5mg  to one tab daily.  Continue present dose of Gabapentin.  Eat meals on a regular schedule. If symptoms become significantly worse during the night or over the weekend, proceed  to the local emergency room.  Followup with psychiatrist Dr. Thresa RossNadeem Akhtar as soon as possible for re-evaluation.  Final Clinical Impressions(s) / UC Diagnoses   Final diagnoses:  Medication reaction, initial encounter  History of seizure disorder  History of anxiety disorder    New Prescriptions Discharge Medication List as of 12/27/2016 12:27 PM       Lattie HawBeese, Lesbia Ottaway A, MD 12/30/16 1000

## 2017-01-05 ENCOUNTER — Other Ambulatory Visit (HOSPITAL_COMMUNITY): Payer: Self-pay | Admitting: Psychiatry

## 2017-01-16 ENCOUNTER — Ambulatory Visit (INDEPENDENT_AMBULATORY_CARE_PROVIDER_SITE_OTHER): Payer: 59 | Admitting: Psychiatry

## 2017-01-16 ENCOUNTER — Encounter (HOSPITAL_COMMUNITY): Payer: Self-pay | Admitting: Psychiatry

## 2017-01-16 VITALS — BP 126/70 | HR 125 | Resp 18 | Ht 68.0 in | Wt 144.0 lb

## 2017-01-16 DIAGNOSIS — F411 Generalized anxiety disorder: Secondary | ICD-10-CM | POA: Diagnosis not present

## 2017-01-16 DIAGNOSIS — F1021 Alcohol dependence, in remission: Secondary | ICD-10-CM

## 2017-01-16 DIAGNOSIS — F17210 Nicotine dependence, cigarettes, uncomplicated: Secondary | ICD-10-CM | POA: Diagnosis not present

## 2017-01-16 DIAGNOSIS — F0630 Mood disorder due to known physiological condition, unspecified: Secondary | ICD-10-CM

## 2017-01-16 MED ORDER — GABAPENTIN 300 MG PO CAPS: 300.0000 mg | ORAL_CAPSULE | Freq: Two times a day (BID) | ORAL | 1 refills | Status: DC

## 2017-01-16 MED ORDER — BUSPIRONE HCL 7.5 MG PO TABS
7.5000 mg | ORAL_TABLET | Freq: Two times a day (BID) | ORAL | 1 refills | Status: DC | PRN
Start: 2017-01-16 — End: 2017-02-26

## 2017-01-16 NOTE — Progress Notes (Signed)
[facility]   Patient Identification: Tony RiverJeffrey D Jennings MRN:  [id number] Date of Evaluation:  01/16/2017 Referral Source: primary care Chief Complaint:   Chief Complaint    Follow-up     Visit Diagnosis:    ICD-10-CM   1. Generalized anxiety disorder F41.1   2. Mood disorder in conditions classified elsewhere F06.30   3. Alcohol use disorder, moderate, in early remission (HCC) F10.21     History of Present Illness:  36 years old currently single Caucasian male living with his girlfriend. Self-employed. Initially  Referred by primary care physician for management of anxiety and mood symptoms  Patient is experiencing depression due to his interest panic attacks and feeling of despair he has been on Seroquel that is helped his sleep in the past but it was making him very sleepy. Last visit we have added buspirone and gabapentin apparently he took more off the trazodone rather than the gabapentin and it made him worse. Now for the last 2 weeks he is back on the dose that he suggested he is doing somewhat better anxiety wise but he does have white collar syndrome his pulse gets high he is aware and his primary care physician is aware of it as well says that his pulses are running normal at home. In general gabapentin has helped some he is not drinking as of now.   Denies hallucinations.   Aggravating factors; relcoation. Past church group when young Modifying factors; best friend, girlfriend. pet Alcohol use : history of pancreatities. Not drinking now  Severity of depression : 5/10  He feels intensity from child attended other worries related to past has gone more flared up since he been daily feeling down    Past Psychiatric History: depression, anxiety  Previous Psychotropic Medications: Yes   Substance Abuse History in the last 12 months:  Yes.    Consequences of Substance Abuse: Medical Consequences:  depression, mood swings. history of pancreatitits  Past  Medical History:  Past Medical History:  Diagnosis Date  . Acute hypokalemia   . Acute hyponatremia   . Alcohol abuse   . Alcohol-induced chronic pancreatitis (HCC)   . ALD (alcoholic liver disease) (HCC)   . Anxiety   . Hepatic steatosis   . Seizures (HCC)   . 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 WRIST SURGERY         Family History:  Family History  Problem Relation Age of Onset  . Hypertension Mother   . Hypertension Father     Social History:   Social History   Social History  . Marital status: Divorced    Spouse name: N/A  . Number of children: N/A  . 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 Smoking status: Current Every Day Smoker    Packs/day: 0.33    Years: 10.00    Types: Cigarettes  . Smokeless tobacco: Never Used  . Alcohol use No     Comment: Drank heavily for years in past.  Recenlty drinking more with anxiety.   . Drug use: No     Comment: prior use marijuana, snorted ritalin, rare cocain. (But this was when 36 yo)  . Sexual activity: Yes    Partners: Female   Other Topics Concern  . None   Social History Narrative  . None      Allergies:  No Known Allergies  Metabolic Disorder Labs: No results found for: HGBA1C, MPG No results found for: PROLACTIN No results found for: CHOL, TRIG, HDL, CHOLHDL, VLDL, LDLCALC   Current Medications: Current Outpatient Prescriptions  Medication Sig Dispense Refill  . busPIRone (BUSPAR) 7.5 MG tablet Take 1 tablet (7.5 mg total) by mouth 2 (two) times daily as needed. 45 tablet 1  . gabapentin (NEURONTIN) 300 MG capsule Take 1 capsule (300 mg total) by mouth 2 (two) times daily. 60 capsule 1  . Multiple Vitamin (MULTIVITAMIN WITH MINERALS) TABS tablet Take 1 tablet by mouth daily.    Marland Kitchen thiamine (VITAMIN B-1) 100 MG tablet Take 100 mg by mouth daily.     No current  facility-administered medications for this visit.       Psychiatric Specialty Exam: Review of Systems  Constitutional: Negative for fever.  Cardiovascular: Negative for palpitations.  Skin: Negative for rash.  Psychiatric/Behavioral: The patient is nervous/anxious.     Blood pressure 126/70, pulse (!) 125, resp. rate 18, height 5\' 8"  (1.727 m), weight 144 lb (65.3 kg), SpO2 94 %.Body mass index is 21.9 kg/m.  General Appearance: Casual  Eye Contact:  Fair  Speech:  Normal Rate  Volume:  Decreased  Mood:  anxious  Affect:  congruent  Thought Process:  Goal Directed  Orientation:  Full (Time, Place, and Person)  Thought Content:  Rumination  Suicidal Thoughts:  No  Homicidal Thoughts:  No  Memory:  Immediate;   Fair Recent;   Fair  Judgement:  Fair  Insight:  Shallow  Psychomotor Activity:  Normal  Concentration:  Concentration: Fair and Attention Span: Fair  Recall:  [employer] of Knowledge:Fair  Language: Good  Akathisia:  Negative  Handed:  Right  AIMS (if indicated):    Assets:  Desire for Improvement  ADL's:  Intact  Cognition: WNL  Sleep:  variable    Treatment Plan Summary: Medication management and Plan as follows   1. Mood disorder : still anxiuos but some better, will increase gabapentin 300mg  bid  2. GAD; anxious. Less then before . Continue buspar. Checked pulse 112/min No palpitations.  3. Alcohol use: severe in remission:remains sober 4. Nicotinue use: says will be ready to quit soon. Advised to cut down at night so sleep improve Reviewed sleep hygiene.  Possible PTSD as past trauma is felt more since he has been depressed Has had not good response to sSRi in past.   Recommend therapy.  Fu 3-4 weeks or early, he understands he can come in early or call 911 in case of emergency or suicidal thoughts Renewed meds. Provided supportive therapy   Thresa Ross, MD 7/12/20184:38 PM

## 2017-01-20 ENCOUNTER — Other Ambulatory Visit (HOSPITAL_COMMUNITY): Payer: Self-pay | Admitting: Psychiatry

## 2017-01-21 NOTE — Telephone Encounter (Signed)
Medication refill- received fax from [facility] requesting a refill for Gabapentin. Per Dr. Gilmore LarocheAkhtar, refill request is denied. Provider change dose on 01/16/17, new prescription was to sent pharmacy for Gabapentin 300mg , #60 with 1 additional refill. Nothing further is need at this time.

## 2017-01-23 ENCOUNTER — Telehealth (HOSPITAL_COMMUNITY): Payer: Self-pay | Admitting: *Deleted

## 2017-01-23 NOTE — Telephone Encounter (Signed)
Medication management- pt's significant other called stating pt is having trouble with memory since changes in medications at last visit. Please advise.

## 2017-01-27 ENCOUNTER — Telehealth (HOSPITAL_COMMUNITY): Payer: Self-pay | Admitting: *Deleted

## 2017-01-27 MED ORDER — BUPROPION HCL ER (SR) 100 MG PO TB12: 100.0000 mg | ORAL_TABLET | Freq: Every day | ORAL | 0 refills | Status: DC

## 2017-01-27 NOTE — Telephone Encounter (Signed)
Lower gabapentin back to 200mg  bid . This is the change we did last time. Other choice is use 300mg  only at night and not bid. Call back for concerns

## 2017-01-27 NOTE — Telephone Encounter (Signed)
Apparently patient has not increased gabpa as stated before. Will add small dose of wellbutrin . Call 911 or  Report to ED if symptoms worsen or not improved or concern of suicide .

## 2017-02-04 ENCOUNTER — Encounter (HOSPITAL_COMMUNITY): Payer: Self-pay | Admitting: Psychiatry

## 2017-02-04 ENCOUNTER — Ambulatory Visit (INDEPENDENT_AMBULATORY_CARE_PROVIDER_SITE_OTHER): Payer: 59 | Admitting: Psychiatry

## 2017-02-04 VITALS — BP 140/90 | HR 116 | Resp 18 | Ht 68.0 in | Wt 144.0 lb

## 2017-02-04 DIAGNOSIS — F1021 Alcohol dependence, in remission: Secondary | ICD-10-CM | POA: Diagnosis not present

## 2017-02-04 DIAGNOSIS — F0630 Mood disorder due to known physiological condition, unspecified: Secondary | ICD-10-CM

## 2017-02-04 DIAGNOSIS — F17210 Nicotine dependence, cigarettes, uncomplicated: Secondary | ICD-10-CM

## 2017-02-04 DIAGNOSIS — F411 Generalized anxiety disorder: Secondary | ICD-10-CM

## 2017-02-04 MED ORDER — GABAPENTIN 100 MG PO CAPS: 100.0000 mg | ORAL_CAPSULE | Freq: Three times a day (TID) | ORAL | 0 refills | Status: DC

## 2017-02-04 MED ORDER — PROPRANOLOL HCL 10 MG PO TABS: 5.0000 mg | ORAL_TABLET | Freq: Two times a day (BID) | ORAL | 0 refills | Status: DC

## 2017-02-04 NOTE — Patient Instructions (Signed)
Start small dose of inderal.  Lower dose of gabapentin to total of 300mg  at night or 100mg  tid.  Consult and schedule with primary care to [facility]  Schedule for indeividual therapy to work on coping skills

## 2017-02-04 NOTE — Progress Notes (Signed)
[facility]   Patient Identification: Tony Jennings MRN:  [id number] Date of Evaluation:  02/04/2017 Referral Source: primary care Chief Complaint:   Chief Complaint    Follow-up     Visit Diagnosis:    ICD-10-CM   1. Generalized anxiety disorder F41.1   2. Mood disorder in conditions classified elsewhere F06.30   3. Alcohol use disorder, moderate, in early remission (HCC) F10.21     History of Present Illness:  [age] years old currently single Caucasian male living with his girlfriend. Self-employed. Initially  Referred by primary care physician for management of anxiety and mood symptoms  Patient is here with his significant other. Last visit we increased the gabapentin apparently there was some call about that his concentration is not to par and also gets distracted easily and getting anxious..Significant other as mentioned that he was on migraine medication Zonogram that apparently mistakenly he has doubled or used excessive thinking it was gabapentin in may/june. That may have caused more anxiety and memory problems or difficulty to focus ( I have informed to make appointment with primary care to discuss it as well)  He feels when he was on gabapentin 100 mg 3 times a day he was doing somewhat better so we will also cut that dose back to that level and evaluated if his difficulty for concentrate and anxiety has been contributed by the overuse of the medication Zonogram.  Pulse remains high but not too high as has been.  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 He takes buspar for anxiety as well but denies being depressed or states depression is because of anxiety  Significant other has mentioned that he always had the leg shakes and anxiety even  when he was drinking.    Denies hallucinations.   Aggravating factors; relcoation. Past church group when [employer] Modifying factors; best friend or girl friend Alcohol use : history of pancreatities. Not drinking now  Severity of depression : 5/10  He feels intensity from child attended other worries related to past has gone more flared up since he been daily feeling down    Past Psychiatric History: depression, anxiety  Previous Psychotropic Medications: Yes   Substance Abuse History in the last 12 months:  Yes.    Consequences of Substance Abuse: Medical Consequences:  depression, mood swings. history of pancreatitits  Past Medical History:  Past Medical History:  Diagnosis Date  . Acute hypokalemia   . Acute hyponatremia   . Alcohol abuse   . Alcohol-induced chronic pancreatitis (HCC)   . ALD (alcoholic liver disease) (HCC)   . Anxiety   . Hepatic steatosis   . Seizures (HCC)   . 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 WRIST SURGERY         Family History:  Family History  Problem Relation Age of Onset  . Hypertension Mother   . Hypertension Father     Social History:   Social History   Social History  . Marital status: Divorced    Spouse name: N/A  . Number of children: N/A  . 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 Smoking  status: Current Every Day Smoker    Packs/day: 0.25    Years: 10.00    Types: Cigarettes  . Smokeless tobacco: Never Used     Comment: reduce the # cig  . Alcohol use No     Comment: Drank heavily for years in past.  Recenlty drinking more with anxiety.   . Drug use: No     Comment: prior use marijuana, snorted ritalin, rare cocain. (But this was when 36 yo)  . Sexual activity: Yes    Partners: Female   Other Topics Concern  . None   Social History Narrative  . None      Allergies:  No Known  Allergies  Metabolic Disorder Labs: No results found for: HGBA1C, MPG No results found for: PROLACTIN No results found for: CHOL, TRIG, HDL, CHOLHDL, VLDL, LDLCALC   Current Medications: Current Outpatient Prescriptions  Medication Sig Dispense Refill  . busPIRone (BUSPAR) 7.5 MG tablet Take 1 tablet (7.5 mg total) by mouth 2 (two) times daily as needed. (Patient taking differently: Take 7.5 mg by mouth daily. ) 45 tablet 1  . gabapentin (NEURONTIN) 100 MG capsule Take 1 capsule (100 mg total) by mouth 3 (three) times daily. 90 capsule 0  . Multiple Vitamin (MULTIVITAMIN WITH MINERALS) TABS tablet Take 1 tablet by mouth daily.    . propranolol (INDERAL) 10 MG tablet Take 0.5 tablets (5 mg total) by mouth 2 (two) times daily. 30 tablet 0  . thiamine (VITAMIN B-1) 100 MG tablet Take 100 mg by mouth daily.     No current facility-administered medications for this visit.       Psychiatric Specialty Exam: Review of Systems  Constitutional: Negative for fever.  Cardiovascular: Negative for chest pain.  Skin: Negative for rash.  Neurological: Positive for tremors.  Psychiatric/Behavioral: The patient is nervous/anxious.     Blood pressure 140/90, pulse (!) 116, resp. rate 18, height 5\' 8"  (1.727 m), weight 144 lb (65.3 kg), SpO2 97 %.Body mass index is 21.9 kg/m.  General Appearance: Casual  Eye Contact:  Fair  Speech:  Normal Rate  Volume:  Decreased  Mood:  anxious  Affect:  congruent  Thought Process:  Goal Directed  Orientation:  Full (Time, Place, and Person)  Thought Content:  Rumination  Suicidal Thoughts:  No  Homicidal Thoughts:  No  Memory:  Immediate;   Fair Recent;   Fair  Judgement:  Fair  Insight:  Shallow  Psychomotor Activity:  Normal  Concentration:  Concentration: Fair and Attention Span: Fair  Recall:  FiservFair  Fund of Knowledge:Fair  Language: Good  Akathisia:  Negative  Handed:  Right  AIMS (if indicated):    Assets:  Desire for Improvement  ADL's:   Intact  Cognition: WNL  Sleep:  variable    Treatment Plan Summary: Medication management and Plan as follows   1. Mood disorder : still anxious more then depressed. Will continue gabapentin at 100mg  tid rather then high dose till he visits primary care. Will add small dose of inderal for anxiety and high pulse 2. GAD;anxious. No chest pain will add inderal 5mg  bid. Continue buspar 3. Alcohol use: severe in early remission. Relapse prevention discussed. May consider campral for craving but as of now concern of anxiety and not craving 4. Nicotinue use: says will be ready to quit soon. Advised to cut down at night so sleep improve Reviewed sleep hygiene.  Possible PTSD as past trauma is felt more since he has been depressed Has had  not good response to sSRi in past. Will start inderal for anxiety and evaluate . Keep gaba dose low as some concern of concentration issues which have risen after overuse of zonogram. Recommend therapy to explore aspects of anxiety  FU 1 month or earlier if needed   Thresa Ross, MD 7/31/201810:48 AM

## 2017-02-12 ENCOUNTER — Ambulatory Visit (INDEPENDENT_AMBULATORY_CARE_PROVIDER_SITE_OTHER): Payer: 59 | Admitting: Licensed Clinical Social Worker

## 2017-02-12 DIAGNOSIS — F1021 Alcohol dependence, in remission: Secondary | ICD-10-CM

## 2017-02-12 DIAGNOSIS — F0630 Mood disorder due to known physiological condition, unspecified: Secondary | ICD-10-CM | POA: Diagnosis not present

## 2017-02-12 DIAGNOSIS — F411 Generalized anxiety disorder: Secondary | ICD-10-CM
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
## 2017-02-26 ENCOUNTER — Encounter (HOSPITAL_COMMUNITY): Payer: Self-pay | Admitting: Psychiatry

## 2017-02-26 ENCOUNTER — Ambulatory Visit (INDEPENDENT_AMBULATORY_CARE_PROVIDER_SITE_OTHER): Payer: 59 | Admitting: Psychiatry

## 2017-02-26 DIAGNOSIS — Z634 Disappearance and death of family member: Secondary | ICD-10-CM

## 2017-02-26 DIAGNOSIS — F411 Generalized anxiety disorder: Secondary | ICD-10-CM | POA: Diagnosis not present

## 2017-02-26 DIAGNOSIS — F0630 Mood disorder due to known physiological condition, unspecified: Secondary | ICD-10-CM

## 2017-02-26 DIAGNOSIS — F17210 Nicotine dependence, cigarettes, uncomplicated: Secondary | ICD-10-CM | POA: Diagnosis not present

## 2017-02-26 DIAGNOSIS — Z560 Unemployment, unspecified: Secondary | ICD-10-CM

## 2017-02-26 DIAGNOSIS — F1021 Alcohol dependence, in remission: Secondary | ICD-10-CM

## 2017-02-26 MED ORDER — ESCITALOPRAM OXALATE 5 MG PO TABS: 5.0000 mg | ORAL_TABLET | Freq: Every day | ORAL | 0 refills | Status: DC

## 2017-02-26 MED ORDER — ESCITALOPRAM OXALATE 10 MG PO TABS: 5.0000 mg | ORAL_TABLET | Freq: Every day | ORAL | 0 refills | Status: DC

## 2017-02-26 NOTE — Progress Notes (Signed)
[facility] Outpatient Follow up visit   Patient Identification: Tony Jennings MRN:  [id number] Date of Evaluation:  02/26/2017 Referral Source: primary care Chief Complaint:    Visit Diagnosis:    ICD-10-CM   1. Mood disorder in conditions classified elsewhere F06.30   2. Generalized anxiety disorder F41.1   3. Alcohol use disorder, moderate, in early remission (HCC) F10.21     History of Present Illness:  36 years old currently single Caucasian male living with his girlfriend. Self-employed. Initially  Referred by primary care physician for management of anxiety and mood symptoms  Patient is here with his mom today. Endorses anxiety goes to sleep at 8 weeks at the 12th and he tries to work or focus but he cant.  Says most part did not help and also made it worse mom concerns about his medication he stakes gabapentin that has helped some He is sober for the last 6 months with her significant loss of his job and also off his dog around the same period of time since then he has been more distracted more anxious and depressed.  Mom is mentioned that he has been a person who would pace and be anxious since adolescent age but this anxiety is more intense. Apparently he also has some resistant to keep himself awake till 12 he falls asleep at 8:00 so we talked about keeping up and resisting sleep until 12 so that he can have more consolidated sleep at night that would also be contributing to his distraction during the day  Denies hallucinations.   Aggravating factors;relocation. Lost job. Church group Modifying factors; girl friend and family Alcohol use : history of pancreatities. Not drinking now  Severity of depression : 5/10  He feels intensity from child attended other worries related to past has gone more flared up since he been daily feeling down    Past Psychiatric History: depression, anxiety  Previous Psychotropic Medications: Yes   Substance Abuse History in the last 12 months:   Yes.    Consequences of Substance Abuse: Medical Consequences:  depression, mood swings. history of pancreatitits  Past Medical History:  Past Medical History:  Diagnosis Date  . Acute hypokalemia   . Acute hyponatremia   . Alcohol abuse   . Alcohol-induced chronic pancreatitis (HCC)   . ALD (alcoholic liver disease) (HCC)   . Anxiety   . Hepatic steatosis   . Seizures (HCC)   . 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 WRIST SURGERY         Family History:  Family History  Problem Relation Age of Onset  . Hypertension Mother   . Hypertension Father     Social History:   Social History   Social History  . Marital status: Divorced    Spouse name: N/A  . Number of children: N/A  . Years of education: N/A   Occupational History  . Risk analyst    Social History Main Topics  . Smoking status: Current Every Day Smoker    Packs/day: 0.25    Years: 10.00    Types: Cigarettes  . Smokeless tobacco: Never Used     Comment: reduce the # cig  . Alcohol use No     Comment: Drank heavily for years in past.  Recenlty drinking more with anxiety.   . Drug use: No     Comment: prior use marijuana, snorted ritalin, rare cocain. (But this was when 36 yo)  .  Sexual activity: Yes    Partners: Female   Other Topics Concern  . None   Social History Narrative  . None      Allergies:  No Known Allergies  Metabolic Disorder Labs: No results found for: HGBA1C, MPG No results found for: PROLACTIN No results found for: CHOL, TRIG, HDL, CHOLHDL, VLDL, LDLCALC   Current Medications: Current Outpatient Prescriptions  Medication Sig Dispense Refill  . escitalopram (LEXAPRO) 5 MG tablet Take 1 tablet (5 mg total) by mouth daily. 30 tablet 0  . gabapentin (NEURONTIN) 100 MG capsule Take 1 capsule (100 mg total) by mouth 3 (three) times daily. 90 capsule 0  . Multiple Vitamin (MULTIVITAMIN WITH MINERALS) TABS tablet  Take 1 tablet by mouth daily.    Marland Kitchen thiamine (VITAMIN B-1) 100 MG tablet Take 100 mg by mouth daily.     No current facility-administered medications for this visit.       Psychiatric Specialty Exam: Review of Systems  Constitutional: Negative for fever.  Cardiovascular: Negative for palpitations.  Skin: Negative for rash.  Neurological: Positive for tremors.  Psychiatric/Behavioral: The patient is nervous/anxious.     There were no vitals taken for this visit.There is no height or weight on file to calculate BMI.  General Appearance: Casual  Eye Contact:  Fair  Speech:  Normal Rate  Volume:  Decreased  Mood:  anxious  Affect:  stressed  Thought Process:  Goal Directed  Orientation:  Full (Time, Place, and Person)  Thought Content:  Rumination  Suicidal Thoughts:  No  Homicidal Thoughts:  No  Memory:  Immediate;   Fair Recent;   Fair  Judgement:  Fair  Insight:  Shallow  Psychomotor Activity:  Normal  Concentration:  Concentration: Fair and Attention Span: Fair  Recall:  [employer] of Knowledge:Fair  Language: Good  Akathisia:  Negative  Handed:  Right  AIMS (if indicated):    Assets:  Desire for Improvement  ADL's:  Intact  Cognition: WNL  Sleep:  variable    Treatment Plan Summary: Medication management and Plan as follows   1. Mood disorder : anxious. Stop buspar, not taking inderal. Continue gabapentin upto 200mg . Mom concerned about depression, anxiety  2. GAD; anxious. Start lexapro has helped mom and other family member low dose Alcohol use  Remission: relapse prevention discussed. Some craving  4. Nicotinue use: says will be ready to quit soon. Advised to cut down at night so sleep improve   Possible PTSD as past trauma is felt more since he has been depressed. Will start lexapro Work on sleep hygiene and not sleep before 12.   Continue therapy to deal with stress, finances and sleep FU 1 month    Thresa Ross, MD 8/22/201811:08 AM

## 2017-02-27 ENCOUNTER — Ambulatory Visit (INDEPENDENT_AMBULATORY_CARE_PROVIDER_SITE_OTHER): Payer: 59 | Admitting: Licensed Clinical Social Worker

## 2017-02-27 DIAGNOSIS — F411 Generalized anxiety disorder: Secondary | ICD-10-CM | POA: Diagnosis not present

## 2017-02-27 DIAGNOSIS — F0630 Mood disorder due to known physiological condition, unspecified: Secondary | ICD-10-CM

## 2017-02-27 DIAGNOSIS — F1021 Alcohol dependence, in remission: Secondary | ICD-10-CM

## 2017-02-27 NOTE — Progress Notes (Signed)
THERAPIST PROGRESS NOTE  Session Time: 3 PM to 3:52 PM  Participation Level: Active  Behavioral Response: CasualAlertEuthymic and Expressed frustration at times, presented with intense emotions in relating early history  Type of Therapy: Individual Therapy  Treatment Goals addressed:  patient work on coping skills to decrease anxiety symptoms,  Interventions: Solution Focused, Strength-based, Supportive, Anger Management Training and Other: Psychoeducation on panic, coping strategies to manage panic  Summary: Tony Jennings is a 36 y.o. male who presents with staying self-employed but keep looking for a regular job, some new clients and situation a little better. Started to review handout on panic, therapist worked on normalizing anxiety and patient expressed frustration of other people's report of anxiety which they believe is extreme and is not when his experience is extreme. Explored triggers for anxiety and patient relates panic symptoms are triggered by significant others, parents, best friend but realizes that there really triggered by himself. Therapist began to review handout to provide education that panic is fight over flight response but a false alarm to what is perceived as dangerous and that symptoms are caused by physiological response of chemical reactions by the body. Patient identify symptoms in response to increase in rate interrupted breathing that applied to his symptoms including starting to yawn, to notice breathlessness, choking or smothering feelings, tightness and pain in the chest, dizziness, lightheadedness, confusion, feelings of unreality. Therapist discussed how deep breathing will help with symptoms in regulating oxygen and carbon dioxide in system. Discussed history with cult church and recognizing this experience as probably being an underlying source for some of his anger issues and response to situation as being a motivating factor for his actions. Discussed  having to 2 gears of 1 or 11 and how this may factor into mental health symptoms. Described conflicts with family related to experiences of church.  Described conflict with mom and described it as a "circular dance", fight about his music, her perspective related to being raised in church and doesn't want to fight about it. Reviewed session and related that he "definitely got some stuff out" and agrees that anger fueling some underlying issues.      Suicidal/Homicidal: No  Therapist Response: Reviewed progress and symptoms and help patient process feelings around early experiences with belonging to a cult church.  Identified this as a underlying source of anger, that  has played a part in what has motivated him, explored patient's life experience with anger, that it has been a motivating force for him and but also sees that it has caused problems in interpersonal relationships. Explored underlying cause of anger to help gain insight to help patient develop effective coping strategies with anger. Discussed how patient's life journey has probably had a significant factor in mental health symptoms. Provided psychoeducation on panic, discussed symptoms as related to the physiological response by the body in preparation for fight/flight, connected increase in the rate interrupted breathing to some of patient's symptoms of panic and discussed why deep breathing can be effective to counter symptoms. Began to process emotions around conflict with mom, explored triggers for mental health symptoms and patient identifies that he is source for symptoms. Discussed how stress in one's life can impact anxiety. Divided supportive and strength-based intervention.  Plan: Return again in 2 weeks.2. Therapist continued work with patient on coping strategies to manage panic, emotional regulation strategies and anger management strategies  Diagnosis: Axis I:  generalized anxiety disorder, mood disorders in conditions classified  elsewhere, alcohol use disorder, moderate,  in early remission    Axis II: No diagnosis    Coolidge Breeze, LCSW 02/27/2017

## 2017-03-03 ENCOUNTER — Other Ambulatory Visit (HOSPITAL_COMMUNITY): Payer: Self-pay | Admitting: Psychiatry

## 2017-03-05 NOTE — Telephone Encounter (Signed)
Medication refill- received [facility] from [facility] requesting a refill for Gabapentin and Propranolol. Per Dr. Gilmore LarocheAkhtar, refill is authorize for Gabapentin 100mg , #90.  Propranolol rx was denied. Per provider, rx was d/c on 02/04/17. Pt's next apt is schedule on 03/18/17. Lvm informing pt of refill status.

## 2017-03-18 ENCOUNTER — Ambulatory Visit (HOSPITAL_COMMUNITY): Payer: Self-pay | Admitting: Psychiatry

## 2017-03-18 ENCOUNTER — Ambulatory Visit (INDEPENDENT_AMBULATORY_CARE_PROVIDER_SITE_OTHER): Payer: 59 | Admitting: Psychiatry

## 2017-03-18 ENCOUNTER — Encounter (HOSPITAL_COMMUNITY): Payer: Self-pay | Admitting: Psychiatry

## 2017-03-18 VITALS — BP 130/80 | HR 112 | Resp 18 | Ht 68.0 in | Wt 147.0 lb

## 2017-03-18 DIAGNOSIS — R450 Nervousness: Secondary | ICD-10-CM

## 2017-03-18 DIAGNOSIS — F0630 Mood disorder due to known physiological condition, unspecified: Secondary | ICD-10-CM | POA: Diagnosis not present

## 2017-03-18 DIAGNOSIS — F17210 Nicotine dependence, cigarettes, uncomplicated: Secondary | ICD-10-CM

## 2017-03-18 DIAGNOSIS — F411 Generalized anxiety disorder: Secondary | ICD-10-CM

## 2017-03-18 DIAGNOSIS — F1021 Alcohol dependence, in remission: Secondary | ICD-10-CM

## 2017-03-18 DIAGNOSIS — Z635 Disruption of family by separation and divorce: Secondary | ICD-10-CM | POA: Diagnosis not present

## 2017-03-18 DIAGNOSIS — G4700 Insomnia, unspecified: Secondary | ICD-10-CM | POA: Diagnosis not present

## 2017-03-18 MED ORDER — ESCITALOPRAM OXALATE 10 MG PO TABS: 10.0000 mg | ORAL_TABLET | Freq: Every day | ORAL | 0 refills | Status: DC

## 2017-03-18 MED ORDER — GABAPENTIN 100 MG PO CAPS: 100.0000 mg | ORAL_CAPSULE | Freq: Four times a day (QID) | ORAL | 0 refills | Status: DC

## 2017-03-18 NOTE — Progress Notes (Signed)
[facility] Outpatient Follow up visit   Patient Identification: Tony Jennings MRN:  [id number] Date of Evaluation:  03/18/2017 Referral Source: primary care Chief Complaint:   Chief Complaint    Follow-up     Visit Diagnosis:    ICD-10-CM   1. Mood disorder in conditions classified elsewhere F06.30   2. Generalized anxiety disorder F41.1   3. Alcohol use disorder, moderate, in early remission (HCC) F10.21     History of Present Illness:  36 years old currently single Caucasian male living with his girlfriend. Self-employed. Initially  Referred by primary care physician for management of anxiety and mood symptoms  Last visit we talked about sleep hygiene to resist not to sleep during the day and also to work on goals to improve and keeping himself distracted from worries. He has got some work done Lexapro was started last visit that a soft anxiety but he still paces and remains anxious. Separation from the girlfriend has been a significant stress and the loss of his prior job but their face timing and trying to get connected or keep collected for now that is helping. Insomnia fluctuates as he tries to work on prjects at time and avoid sleep to finish. Last visit Lexapro started also gabapentin was increased one of them is helped some but he feels that medication is to be increased further as he did respond  Denies hallucinations.   Aggravating factors;lost job, relocation. Church group Modifying factors;girl friend, but currently seperated Alcohol use : history of pancreatities. Remains sober. Some craving.  Severity of depression : 5/10  He feels intensity from child attended other worries related to past has gone more flared up since he been daily feeling down    Past Psychiatric History: depression, anxiety  Previous Psychotropic Medications: Yes   Substance Abuse History in the last 12 months:  Yes.    Consequences of Substance Abuse: Medical Consequences:  depression, mood  swings. history of pancreatitits  Past Medical History:  Past Medical History:  Diagnosis Date  . Acute hypokalemia   . Acute hyponatremia   . Alcohol abuse   . Alcohol-induced chronic pancreatitis (HCC)   . ALD (alcoholic liver disease) (HCC)   . Anxiety   . Hepatic steatosis   . Seizures (HCC)   . 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 WRIST SURGERY         Family History:  Family History  Problem Relation Age of Onset  . Hypertension Mother   . Hypertension Father     Social History:   Social History   Social History  . Marital status: Divorced    Spouse name: N/A  . Number of children: N/A  . Years of education: N/A   Occupational History  . Risk analyst    Social History Main Topics  . Smoking status: Current Every Day Smoker    Packs/day: 0.25    Years: 10.00    Types: Cigarettes  . Smokeless tobacco: Never Used     Comment: reduce the # cig  . Alcohol use No     Comment: Drank heavily for years in past.  Recenlty drinking more with anxiety.   . Drug use: No     Comment: prior use marijuana, snorted ritalin, rare cocain. (But this was when 36 yo)  . Sexual activity: Yes    Partners: Female   Other Topics Concern  . None   Social History Narrative  .  None      Allergies:  No Known Allergies  Metabolic Disorder Labs: No results found for: HGBA1C, MPG No results found for: PROLACTIN No results found for: CHOL, TRIG, HDL, CHOLHDL, VLDL, LDLCALC   Current Medications: Current Outpatient Prescriptions  Medication Sig Dispense Refill  . escitalopram (LEXAPRO) 10 MG tablet Take 1 tablet (10 mg total) by mouth daily. 30 tablet 0  . gabapentin (NEURONTIN) 100 MG capsule Take 1 capsule (100 mg total) by mouth 4 (four) times daily. 120 capsule 0  . Multiple Vitamin (MULTIVITAMIN WITH MINERALS) TABS tablet Take 1 tablet by mouth daily.    Marland Kitchen. thiamine (VITAMIN B-1) 100 MG tablet Take 100 mg  by mouth daily.     No current facility-administered medications for this visit.       Psychiatric Specialty Exam: Review of Systems  Constitutional: Negative for fever.  Cardiovascular: Negative for chest pain and palpitations.  Skin: Negative for rash.  Neurological: Positive for tremors.  Psychiatric/Behavioral: The patient is nervous/anxious and has insomnia.     Blood pressure 130/80, pulse (!) 112, resp. rate 18, height 5\' 8"  (1.727 m), weight 147 lb (66.7 kg), SpO2 96 %.Body mass index is 22.35 kg/m.  General Appearance: Casual  Eye Contact:  Fair  Speech:  Normal Rate  Volume:  Decreased  Mood:  Somewhat improved anxiety but still anxious  Affect: congruent  Thought Process:  Goal Directed  Orientation:  Full (Time, Place, and Person)  Thought Content:  Rumination  Suicidal Thoughts:  No  Homicidal Thoughts:  No  Memory:  Immediate;   Fair Recent;   Fair  Judgement:  Fair  Insight:  Shallow  Psychomotor Activity:  Normal  Concentration:  Concentration: Fair and Attention Span: Fair  Recall:  [employer]  Fund of Knowledge:Fair  Language: Good  Akathisia:  Negative  Handed:  Right  AIMS (if indicated):    Assets:  Desire for Improvement  ADL's:  Intact  Cognition: WNL  Sleep:  variable    Treatment Plan Summary: Medication management and Plan as follows   1. Mood disorder : somewhat improved mood. Continue lexapro and increase gabapentin 2. GAD; less anxious. incrase lexapro has responded to 10mg . Increase gaba Alcohol use  Remission: relapse prevention discussed  4. Nicotinue use: says will be ready to quit soon. Advised to cut down at night so sleep improve  FU for possible PTSD and anxiety exacerbations with therapy Renewed meds FU 4 weeks.   Thresa RossAKHTAR, Marlaysia Lenig, MD 9/11/201810:46 AM

## 2017-03-19 ENCOUNTER — Ambulatory Visit (INDEPENDENT_AMBULATORY_CARE_PROVIDER_SITE_OTHER): Payer: 59 | Admitting: Licensed Clinical Social Worker

## 2017-03-19 DIAGNOSIS — F0630 Mood disorder due to known physiological condition, unspecified: Secondary | ICD-10-CM | POA: Diagnosis not present

## 2017-03-19 DIAGNOSIS — F1021 Alcohol dependence, in remission: Secondary | ICD-10-CM | POA: Diagnosis not present

## 2017-03-19 DIAGNOSIS — F411 Generalized anxiety disorder: Secondary | ICD-10-CM | POA: Diagnosis not present

## 2017-03-19 NOTE — Progress Notes (Signed)
THERAPIST PROGRESS NOTE  Session Time: 11 AM to 11:52 AM  Participation Level: Active  Behavioral Response: CasualAlertEuthymic and Angry and irritable at times in session  Type of Therapy: Individual Therapy  Treatment Goals addressed: patient work on coping skills to decrease anxiety symptoms,  Interventions: CBT, Solution Focused, Strength-based, Supportive, Anger Management Training and Reframing  Summary: Tony Jennings is a 36 y.o. male who presents with "really hit and miss", where he has good days and then full on depressed, spend time in bed or couch. When depressed unable to work on business, unable to communicate well. Describes being in a house with dad recuperates with knee replacement and when depressed incapable of helping him. Describes anger as compounding depression and then describes cycle of getting angry, then anxious and then depressed. Relates he gets angry about things he can't do a lot about and therapist pointed out that anger is related to things outside of our control.  Relates anger related to things happening years ago including ex marriage and being and cult. He needs to let it go and frustrated because he can't. Relates ex marriage "messed him up really good" related to taking his money, broke trust and impacted relationship with family and friends and he carries issues into current relationship. Anxious because cognizant that he can't seem to fix it, they talk about it but it still hurts her. Relates worsening of symptoms after loss of his dog, a super emotional time. Describes experiences of perpetually angry, depressed, or anxious. Described he heard yesterday which has a significant impact that "you can't spend your life looking over your shoulder" and discussed with therapist what is the point as there is nothing you can do about the past. All one can do is reflect and learn and no value in being angry. Relates what has been helpful lately is to follow  practice of prayer, meditation and planning and that it brings him peace, better understanding, awareness and focus. The hard part is getting himself to be motivated to do it. Better able to communicate with clients and has a better day. Describe finding some type of higher power. Even if it's just talking to himself he is able to express himself more, be more honest. Has gotten easier to deal with divorce by talking to friends but more difficult to connect with anyone about cult experiences. Describes experience of flashback to past, and then gets angry and relates to himself "I can do better". Describes anger as positive in a driving force. Describes stressors of being self-employed and "chasing money"that causes anxiety and depression. Reviewed session and identified asking oneself what the point is an holding on to help unlike go, focus on value of life currently and utilizing prayer to help in coping.     Suicidal/Homicidal: No  Therapist Response: Reviewed progress and symptoms and identify core concepts for anxiety, depression and anger includes letting go and self talk that questions but the point isn't holding onto things. Discussed how loss of dog has played a part in escalation of symptoms and validated patient on going through a period of morning where he is going to experience difficult emotions. Encouraged patient to identify thoughts he is having when depressed, describes these thoughts as automatic thoughts to pay more attention to them so that he is able to challenge them for validity. Explored holding onto the past and be unhelpful as there is not a thing he can do about it and keeps him from putting his energy  and to the present and living fully in the present. Help patient to process feelings in order to help cope, discuss how after letting go he'll be able to explore lessons from experiences. Reinforce patient's effective coping that includes prayer, meditation planning that helps him with  awareness and peace and to continue this on a regular basis. Provided perspective to help patient see difficulty of life experiences to move past and self compassion is helpful in coping. Provided strength based and supportive interventions.  Plan: Return again in 2 weeks.2. Therapist continued work with patient on coping strategies to manage panic, emotional regulation strategies and anger management strategies 3. Patient work through past issues to help work through emotions to help with mood regulation  Diagnosis: Axis I: generalized anxiety disorder, mood disorders in conditions classified elsewhere, alcohol use disorder, moderate, in early remission    Axis II: No diagnosis    Coolidge Breeze, LCSW 03/19/2017

## 2017-03-25 ENCOUNTER — Other Ambulatory Visit (HOSPITAL_COMMUNITY): Payer: Self-pay | Admitting: Psychiatry

## 2017-03-25 NOTE — Telephone Encounter (Signed)
Medication refill- received refill request from [facility] for Lexapro . Per Dr. Gilmore Laroche, refill request is denied. Provider increase dose of Lexapro from  to  on 03/18/17. New rx was sent to pharmacy on 03/18/17. Pt's next apt is schedule on 04/16/17. Nothing further is need at this time.

## 2017-04-03 ENCOUNTER — Ambulatory Visit (INDEPENDENT_AMBULATORY_CARE_PROVIDER_SITE_OTHER): Payer: 59 | Admitting: Licensed Clinical Social Worker

## 2017-04-03 DIAGNOSIS — F0630 Mood disorder due to known physiological condition, unspecified: Secondary | ICD-10-CM | POA: Diagnosis not present

## 2017-04-03 DIAGNOSIS — F411 Generalized anxiety disorder: Secondary | ICD-10-CM | POA: Diagnosis not present

## 2017-04-03 DIAGNOSIS — F1021 Alcohol dependence, in remission: Secondary | ICD-10-CM

## 2017-04-03 NOTE — Progress Notes (Signed)
   THERAPIST PROGRESS NOTE  Session Time: 10 AM to 10:52 AM  Participation Level: Active  Behavioral Response: CasualAlertEuthymic  Type of Therapy: Individual Therapy  Treatment Goals addressed:  decrease anxiety, depression and anger, address cycle of anxiety, anger, depression, work on underlying sources for these symptoms  Interventions: DBT, Solution Focused, Strength-based, Supportive, Family Systems and Other: Coping strategies for emotions  Summary: Tony Jennings is a 36 y.o. male who presents with description of how he is doing as "good, bad and ugly". He broke toe, works been good, things better with significant other. Describes anger with life the toe was broken over accident, drumming has been something he's done all his life and helped to get out his emotions including frustrations, switched to guitars,  writing songs, not same cathartic feeling, focusing on art, only other out. Has dealt with pent up emotions since young, drumming so much part of his life, it "almost feels like he lost someone" because it has always been part of his life. It makes him so mad. He can't control so much in his life but can control playing his music. He could "beat out anxiety", as ground doesn't have to beat it out as much but always had the chance if he needs to. Described being a odds with mom, they are identical but on opposite ends of the coin. Both stubborn and approach things the same way. Both can't let it go. Discussed with therapist difficulties of being independent for most of his life and then living with parents for the last 3 months. Described habit of drinking water and very positive benefits he has seen for example on scan and mood and helping to remove sugar from his body. Patient developed insight of not wanting his mom to change him so realizing not being able to change his mom. Described charge has been more helpful than AA, has developed a relationship with God, more nebulous than  organized religion, reading the Big Book. Relates that mom is so supportive and reads every morning out of [place] book. Introduced DBT and distress tolerance and patient describes counting everything and that is how is brain works and describes beats as listen to beats of music. Completed treatment. plan.Suicidal/Homicidal: No  Therapist Response: Assess patient current functioning per report. Process with patient recent stressors and coping with stressors. Assisted patient with reframing. Explored family systems for better coping through insight. Reinforced effective coping strategies patient is using as well as replacing ones he cannot use currently with other coping strategies and recognizing something to be gained by investing in different coping strategies. Introduced DBT and discussed distress tolerance as strategies to tolerate difficult emotions and connected these strategies to some of the strategies patient is currently using.  Completed treatment plan  Plan: Return again in 2 weeks.2.Marland Kitchen Therapist continued work with patient on coping strategies to manage panic, emotional regulation strategies and anger management strategies 3. Patient work through past issues to help work through emotions to help with mood regulation  Diagnosis: Axis I: generalized anxiety disorder, mood disorders in conditions classified elsewhere, alcohol use disorder, moderate, in early remission    Axis II: No diagnosis    Coolidge Breeze, LCSW 04/03/2017

## 2017-04-05 ENCOUNTER — Emergency Department (HOSPITAL_BASED_OUTPATIENT_CLINIC_OR_DEPARTMENT_OTHER): Payer: 59

## 2017-04-05 ENCOUNTER — Inpatient Hospital Stay (HOSPITAL_BASED_OUTPATIENT_CLINIC_OR_DEPARTMENT_OTHER)
Admission: EM | Admit: 2017-04-05 | Discharge: 2017-04-09 | DRG: 897 | Disposition: A | Discharge status: Home / Self Care | Payer: 59 | Attending: Internal Medicine | Admitting: Internal Medicine

## 2017-04-05 ENCOUNTER — Encounter (HOSPITAL_BASED_OUTPATIENT_CLINIC_OR_DEPARTMENT_OTHER): Payer: Self-pay | Admitting: Emergency Medicine

## 2017-04-05 DIAGNOSIS — R739 Hyperglycemia, unspecified: Secondary | ICD-10-CM | POA: Diagnosis present

## 2017-04-05 DIAGNOSIS — K860 Alcohol-induced chronic pancreatitis: Secondary | ICD-10-CM | POA: Diagnosis present

## 2017-04-05 DIAGNOSIS — F10231 Alcohol dependence with withdrawal delirium: Secondary | ICD-10-CM | POA: Diagnosis present

## 2017-04-05 DIAGNOSIS — R4587 Impulsiveness: Secondary | ICD-10-CM | POA: Diagnosis present

## 2017-04-05 DIAGNOSIS — F17210 Nicotine dependence, cigarettes, uncomplicated: Secondary | ICD-10-CM | POA: Diagnosis present

## 2017-04-05 DIAGNOSIS — G40909 Epilepsy, unspecified, not intractable, without status epilepticus: Secondary | ICD-10-CM | POA: Diagnosis present

## 2017-04-05 DIAGNOSIS — G312 Degeneration of nervous system due to alcohol: Secondary | ICD-10-CM | POA: Diagnosis present

## 2017-04-05 DIAGNOSIS — Z8249 Family history of ischemic heart disease and other diseases of the circulatory system: Secondary | ICD-10-CM

## 2017-04-05 DIAGNOSIS — D649 Anemia, unspecified: Secondary | ICD-10-CM | POA: Diagnosis present

## 2017-04-05 DIAGNOSIS — Z781 Physical restraint status: Secondary | ICD-10-CM | POA: Diagnosis not present

## 2017-04-05 DIAGNOSIS — F411 Generalized anxiety disorder: Secondary | ICD-10-CM | POA: Diagnosis present

## 2017-04-05 DIAGNOSIS — K7010 Alcoholic hepatitis without ascites: Secondary | ICD-10-CM | POA: Diagnosis present

## 2017-04-05 DIAGNOSIS — R945 Abnormal results of liver function studies: Secondary | ICD-10-CM

## 2017-04-05 DIAGNOSIS — D696 Thrombocytopenia, unspecified: Secondary | ICD-10-CM | POA: Diagnosis present

## 2017-04-05 DIAGNOSIS — Z888 Allergy status to other drugs, medicaments and biological substances status: Secondary | ICD-10-CM | POA: Diagnosis not present

## 2017-04-05 DIAGNOSIS — R7989 Other specified abnormal findings of blood chemistry: Secondary | ICD-10-CM

## 2017-04-05 DIAGNOSIS — R569 Unspecified convulsions: Secondary | ICD-10-CM

## 2017-04-05 DIAGNOSIS — K760 Fatty (change of) liver, not elsewhere classified: Secondary | ICD-10-CM | POA: Diagnosis present

## 2017-04-05 DIAGNOSIS — F32A Depression, unspecified: Secondary | ICD-10-CM | POA: Diagnosis present

## 2017-04-05 DIAGNOSIS — E871 Hypo-osmolality and hyponatremia: Secondary | ICD-10-CM | POA: Diagnosis present

## 2017-04-05 DIAGNOSIS — Z560 Unemployment, unspecified: Secondary | ICD-10-CM | POA: Diagnosis not present

## 2017-04-05 DIAGNOSIS — F10931 Alcohol use, unspecified with withdrawal delirium: Secondary | ICD-10-CM | POA: Diagnosis present

## 2017-04-05 DIAGNOSIS — F329 Major depressive disorder, single episode, unspecified: Secondary | ICD-10-CM | POA: Diagnosis present

## 2017-04-05 HISTORY — DX: Depression, unspecified: F32.A

## 2017-04-05 HISTORY — DX: Anemia, unspecified: D64.9

## 2017-04-05 HISTORY — DX: Major depressive disorder, single episode, unspecified: F32.9

## 2017-04-05 LAB — CBC WITH DIFFERENTIAL/PLATELET
Basophils Absolute: 0.1 10*3/uL (ref 0.0–0.1)
Basophils Relative: 1 %
EOS ABS: 0 10*3/uL (ref 0.0–0.7)
EOS PCT: 0 %
HEMATOCRIT: 34.5 % — AB (ref 39.0–52.0)
Hemoglobin: 10.9 g/dL — ABNORMAL LOW (ref 13.0–17.0)
LYMPHS ABS: 0.7 10*3/uL (ref 0.7–4.0)
Lymphocytes Relative: 12 %
MCH: 28.5 pg (ref 26.0–34.0)
MCHC: 31.6 g/dL (ref 30.0–36.0)
MCV: 90.3 fL (ref 78.0–100.0)
MONOS PCT: 7 %
Monocytes Absolute: 0.4 10*3/uL (ref 0.1–1.0)
Neutro Abs: 4.5 10*3/uL (ref 1.7–7.7)
Neutrophils Relative %: 81 %
PLATELETS: 44 10*3/uL — AB (ref 150–400)
RBC: 3.82 MIL/uL — AB (ref 4.22–5.81)
RDW: 17.4 % — AB (ref 11.5–15.5)
WBC: 5.6 10*3/uL (ref 4.0–10.5)

## 2017-04-05 LAB — COMPREHENSIVE METABOLIC PANEL
ALT: 85 U/L — ABNORMAL HIGH (ref 17–63)
AST: 369 U/L — ABNORMAL HIGH (ref 15–41)
Albumin: 3.2 g/dL — ABNORMAL LOW (ref 3.5–5.0)
Alkaline Phosphatase: 162 U/L — ABNORMAL HIGH (ref 38–126)
Anion gap: 10 (ref 5–15)
BUN: 6 mg/dL (ref 6–20)
CALCIUM: 9.5 mg/dL (ref 8.9–10.3)
CO2: 24 mmol/L (ref 22–32)
CREATININE: 0.78 mg/dL (ref 0.61–1.24)
Chloride: 98 mmol/L — ABNORMAL LOW (ref 101–111)
GFR calc non Af Amer: >60 mL/min (ref >60)
GLUCOSE: 121 mg/dL — AB (ref 65–99)
Potassium: 3 mmol/L — ABNORMAL LOW (ref 3.5–5.1)
SODIUM: 132 mmol/L — AB (ref 135–145)
Total Bilirubin: 2.5 mg/dL — ABNORMAL HIGH (ref 0.3–1.2)
Total Protein: 6.3 g/dL — ABNORMAL LOW (ref 6.5–8.1)

## 2017-04-05 LAB — RAPID URINE DRUG SCREEN, HOSP PERFORMED
AMPHETAMINES: NOT DETECTED
BENZODIAZEPINES: NOT DETECTED
Barbiturates: NOT DETECTED
Cocaine: NOT DETECTED
OPIATES: NOT DETECTED
TETRAHYDROCANNABINOL: NOT DETECTED

## 2017-04-05 LAB — ETHANOL: Alcohol, Ethyl (B): <10 mg/dL (ref <10)

## 2017-04-05 LAB — SALICYLATE LEVEL: Salicylate Lvl: <7 mg/dL (ref 2.8–30.0)

## 2017-04-05 LAB — PHOSPHORUS: PHOSPHORUS: 2.3 mg/dL — AB (ref 2.5–4.6)

## 2017-04-05 LAB — BASIC METABOLIC PANEL
Anion gap: 17 — ABNORMAL HIGH (ref 5–15)
BUN: 7 mg/dL (ref 6–20)
CHLORIDE: 97 mmol/L — AB (ref 101–111)
CO2: 19 mmol/L — AB (ref 22–32)
CREATININE: 0.95 mg/dL (ref 0.61–1.24)
Calcium: 9 mg/dL (ref 8.9–10.3)
GFR calc Af Amer: >60 mL/min (ref >60)
GFR calc non Af Amer: >60 mL/min (ref >60)
Glucose, Bld: 175 mg/dL — ABNORMAL HIGH (ref 65–99)
POTASSIUM: 3.7 mmol/L (ref 3.5–5.1)
Sodium: 133 mmol/L — ABNORMAL LOW (ref 135–145)

## 2017-04-05 LAB — RETICULOCYTES
RBC.: 3.37 MIL/uL — ABNORMAL LOW (ref 4.22–5.81)
RETIC CT PCT: 4.1 % — AB (ref 0.4–3.1)
Retic Count, Absolute: 138.2 10*3/uL (ref 19.0–186.0)

## 2017-04-05 LAB — ACETAMINOPHEN LEVEL

## 2017-04-05 LAB — MRSA PCR SCREENING: MRSA by PCR: NEGATIVE

## 2017-04-05 LAB — LIPASE, BLOOD: Lipase: 76 U/L — ABNORMAL HIGH (ref 11–51)

## 2017-04-05 LAB — MAGNESIUM: MAGNESIUM: 1.7 mg/dL (ref 1.7–2.4)

## 2017-04-05 MED ORDER — LORAZEPAM 2 MG/ML IJ SOLN
0.0000 mg | Freq: Four times a day (QID) | INTRAMUSCULAR | Status: DC
  Administered 2017-04-05 (×2): 2 mg via INTRAVENOUS
  Filled 2017-04-05 (×2): qty 1

## 2017-04-05 MED ORDER — VITAMIN B-1 100 MG PO TABS
100.0000 mg | ORAL_TABLET | Freq: Every day | ORAL | Status: DC
  Administered 2017-04-05 – 2017-04-07 (×2): 100 mg via ORAL
  Filled 2017-04-05 (×2): qty 1

## 2017-04-05 MED ORDER — LORAZEPAM 2 MG/ML IJ SOLN
2.0000 mg | INTRAMUSCULAR | Status: DC | PRN
  Administered 2017-04-05: 1 mg via INTRAVENOUS
  Administered 2017-04-05 (×2): 2 mg via INTRAVENOUS
  Administered 2017-04-06 (×2): 3 mg via INTRAVENOUS
  Administered 2017-04-06 (×3): 2 mg via INTRAVENOUS
  Administered 2017-04-06: 3 mg via INTRAVENOUS
  Administered 2017-04-06 (×2): 2 mg via INTRAVENOUS
  Administered 2017-04-06 (×2): 3 mg via INTRAVENOUS
  Administered 2017-04-06 (×2): 2 mg via INTRAVENOUS
  Administered 2017-04-07: 3 mg via INTRAVENOUS
  Administered 2017-04-07: 2 mg via INTRAVENOUS
  Administered 2017-04-07 (×4): 3 mg via INTRAVENOUS
  Filled 2017-04-05: qty 2
  Filled 2017-04-05: qty 1
  Filled 2017-04-05: qty 2
  Filled 2017-04-05: qty 1
  Filled 2017-04-05: qty 2
  Filled 2017-04-05 (×2): qty 1
  Filled 2017-04-05: qty 2
  Filled 2017-04-05 (×2): qty 1
  Filled 2017-04-05: qty 2
  Filled 2017-04-05 (×2): qty 1
  Filled 2017-04-05: qty 2
  Filled 2017-04-05 (×2): qty 1
  Filled 2017-04-05: qty 2
  Filled 2017-04-05 (×2): qty 1
  Filled 2017-04-05 (×3): qty 2

## 2017-04-05 MED ORDER — LORAZEPAM 1 MG PO TABS: 0.0000 mg | ORAL_TABLET | Freq: Two times a day (BID) | ORAL | Status: DC

## 2017-04-05 MED ORDER — LORAZEPAM 2 MG/ML IJ SOLN
2.0000 mg | Freq: Once | INTRAMUSCULAR | Status: DC
  Filled 2017-04-05: qty 1

## 2017-04-05 MED ORDER — POTASSIUM CHLORIDE CRYS ER 10 MEQ PO TBCR
10.0000 meq | EXTENDED_RELEASE_TABLET | Freq: Once | ORAL | Status: AC
  Administered 2017-04-06: 10 meq via ORAL
  Filled 2017-04-05: qty 1

## 2017-04-05 MED ORDER — NICOTINE 21 MG/24HR TD PT24
21.0000 mg | MEDICATED_PATCH | Freq: Every day | TRANSDERMAL | Status: DC
  Administered 2017-04-05 – 2017-04-09 (×5): 21 mg via TRANSDERMAL
  Filled 2017-04-05 (×5): qty 1

## 2017-04-05 MED ORDER — LORAZEPAM 1 MG PO TABS
0.0000 mg | ORAL_TABLET | Freq: Four times a day (QID) | ORAL | Status: DC
Start: 2017-04-05 — End: 2017-04-05

## 2017-04-05 MED ORDER — MAGNESIUM SULFATE 2 GM/50ML IV SOLN
2.0000 g | Freq: Once | INTRAVENOUS | Status: AC
  Administered 2017-04-06: 2 g via INTRAVENOUS
  Filled 2017-04-05: qty 50

## 2017-04-05 MED ORDER — POTASSIUM CHLORIDE IN NACL 20-0.9 MEQ/L-% IV SOLN
INTRAVENOUS | Status: DC
  Administered 2017-04-05: 22:00:00 via INTRAVENOUS
  Administered 2017-04-06: 125 mL via INTRAVENOUS
  Filled 2017-04-05: qty 1000

## 2017-04-05 MED ORDER — LORAZEPAM 2 MG/ML IJ SOLN
1.0000 mg | Freq: Once | INTRAMUSCULAR | Status: AC
  Administered 2017-04-05: 1 mg via INTRAVENOUS
  Filled 2017-04-05: qty 1

## 2017-04-05 MED ORDER — ONDANSETRON HCL 4 MG/2ML IJ SOLN: 4.0000 mg | Freq: Four times a day (QID) | INTRAMUSCULAR | Status: DC | PRN

## 2017-04-05 MED ORDER — THIAMINE HCL 100 MG/ML IJ SOLN
100.0000 mg | Freq: Every day | INTRAMUSCULAR | Status: DC
  Administered 2017-04-06: 100 mg via INTRAVENOUS

## 2017-04-05 MED ORDER — LORAZEPAM 2 MG/ML IJ SOLN: 0.0000 mg | Freq: Two times a day (BID) | INTRAMUSCULAR | Status: DC

## 2017-04-05 MED ORDER — POTASSIUM PHOSPHATE MONOBASIC 500 MG PO TABS
500.0000 mg | ORAL_TABLET | Freq: Three times a day (TID) | ORAL | Status: AC
  Administered 2017-04-06 (×2): 500 mg via ORAL
  Filled 2017-04-05 (×3): qty 1

## 2017-04-05 MED ORDER — ONDANSETRON HCL 4 MG PO TABS: 4.0000 mg | ORAL_TABLET | Freq: Four times a day (QID) | ORAL | Status: DC | PRN

## 2017-04-05 NOTE — ED Notes (Signed)
[facility] here, no changes 

## 2017-04-05 NOTE — H&P (Signed)
History and Physical    Tony Jennings ZOX:[id number] DOB: 11-Dec-1980 DOA: 04/05/2017  PCP: Donato Schultz, DO   Patient coming from: [facility]  I have personally briefly reviewed patient's old medical records in [facility]  Chief Complaint: Alcohol withdrawal.  HPI: Tony Jennings is a 36 y.o. 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  Per patient, he had been sober for about a year after spending almost a month in rehabilitation. However, he states that he lost his job recently, felt anxious, depressed and an urge to start drinking again. He stopped going to [employer] and began socializing with friends that drink alcohol. He has been drinking about a pint of alcohol every day until 2 days ago. Today in the morning he was taken by his parents to the emergency department at [facility] for evaluation. He was very confused and underwent CT and MRI of the head, which were normal. He denies homicidal or suicidal ideations. He denies headache, chest pain, dizziness, palpitations, diaphoresis, dyspnea, PND, orthopnea or pitting edema of the lower extremities. Denies abdominal pain, nausea, emesis, diarrhea, constipation, melena or hematochezia. He denies GU symptoms. No polyuria, polydipsia or blurred vision.  ED Course: Vital signs in the emergency department blood pressure 143/93 mmHg, pulse 113, temperature 99.76F, respirations 15 and O2 sat 96% on room air. He was started on CIWA protocol and subsequently transferred to this facility. His urine toxicology was negative. WBC 5.6 with 81% neutrophils, hemoglobin 10.9 g/dL and platelets were 44. EtOH, acetaminophen and aspirin levels were within normal limits. His CMP showed a sodium of  133, bicarbonate of 19 mmol/L and a blood glucose of 175 mg/dL. All other values were normal. EKG was sinus tach with minimal ST depression on inferior leads and Baseline wander.  Review of Systems: As per HPI otherwise 10 point review of systems negative.    Past Medical History:  Diagnosis Date  . Acute hypokalemia   . Acute hyponatremia   . Alcohol abuse   . Alcohol-induced chronic pancreatitis (HCC)   . ALD (alcoholic liver disease) (HCC)   . Anemia 04/05/2017  . Anxiety   . Depression   . Hepatic steatosis   . Seizures (HCC)   . 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 WRIST SURGERY       reports that he has been smoking Cigarettes.  He has a 2.50 pack-year smoking history. He has never used smokeless tobacco. He reports that he does not drink alcohol or use drugs.  No Known Allergies  Family History  Problem Relation Age of Onset  . Hypertension Mother   . Hypertension Father     Prior to Admission medications   Medication Sig Start Date End Date Taking? Authorizing Provider  escitalopram (LEXAPRO) 10 MG tablet Take 1 tablet (10 mg total) by mouth daily. 03/18/17   Thresa Ross, MD  gabapentin (NEURONTIN) 100 MG capsule Take 1 capsule (100 mg total) by mouth 4 (four) times daily. 03/18/17   Thresa Ross, MD  Multiple Vitamin (MULTIVITAMIN WITH MINERALS) TABS tablet Take 1 tablet by mouth daily.    [provider]  thiamine (VITAMIN B-1) 100 MG tablet Take 100 mg by mouth daily.    [provider]  Physical Exam: Vitals:   04/05/17 2015 04/05/17 2018 04/05/17 2100 04/05/17 2109  BP: (!) 130/95  (!) 137/112   Pulse: (!) 120 (!) 115 (!) 119   Resp:      Temp:    99.2 F (37.3 C)  TempSrc:    Oral  SpO2: 100% 100%    Weight:    66 kg (145 lb 8.1 oz)  Height:     (1.753 m)    Constitutional: Mildly tremulous and anxious, but otherwise in NAD, calm, comfortable Eyes: PERRL, lids and  conjunctivae normal ENMT: Mucous membranes are moist. Mild tongue fasciculations. Posterior pharynx clear of any exudate or lesions. Neck: normal, supple, no masses, no thyromegaly Respiratory: clear to auscultation bilaterally, no wheezing, no crackles. Normal respiratory effort. No accessory muscle use.  Cardiovascular: Tachycardic at 118 BPM, no murmurs / rubs / gallops. No extremity edema. 2+ pedal pulses. No carotid bruits.  Abdomen: Soft, no tenderness, no masses palpated. No hepatosplenomegaly. Bowel sounds positive.  Musculoskeletal: no clubbing / cyanosis. Good ROM, no contractures. Normal muscle tone.  Skin: There are some areas of ecchymosis on lower and upper extremities. Neurologic: Mild basal tremor. CN 2-12 grossly intact. Sensation intact, DTR normal. Strength 5/5 in all 4.  Psychiatric: Alert. It took him some time to think about the answer is, but otherwise is oriented 4.  Mildly anxious mood.    Labs on Admission: I have personally reviewed following labs and imaging studies  CBC:  Recent Labs Lab 04/05/17 0845  WBC 5.6  NEUTROABS 4.5  HGB 10.9*  HCT 34.5*  MCV 90.3  PLT 44*   Basic Metabolic Panel:  Recent Labs Lab 04/05/17 0845  NA 133*  K 3.7  CL 97*  CO2 19*  GLUCOSE 175*  BUN 7  CREATININE 0.95  CALCIUM 9.0   GFR: Estimated Creatinine Clearance: 99.4 mL/min (by C-G formula based on SCr of 0.95 mg/dL). Liver Function Tests: No results for input(s): AST, ALT, ALKPHOS, BILITOT, PROT, ALBUMIN in the last 168 hours. No results for input(s): LIPASE, AMYLASE in the last 168 hours. No results for input(s): AMMONIA in the last 168 hours. Coagulation Profile: No results for input(s): INR, PROTIME in the last 168 hours. Cardiac Enzymes: No results for input(s): CKTOTAL, CKMB, CKMBINDEX, TROPONINI in the last 168 hours. BNP (last 3 results) No results for input(s): PROBNP in the last 8760 hours. HbA1C: No results for input(s): HGBA1C in the last 72  hours. CBG: No results for input(s): GLUCAP in the last 168 hours. Lipid Profile: No results for input(s): CHOL, HDL, LDLCALC, TRIG, CHOLHDL, LDLDIRECT in the last 72 hours. Thyroid Function Tests: No results for input(s): TSH, T4TOTAL, FREET4, T3FREE, THYROIDAB in the last 72 hours. Anemia Panel: No results for input(s): VITAMINB12, FOLATE, FERRITIN, TIBC, IRON, RETICCTPCT in the last 72 hours. Urine analysis: No results found for: COLORURINE, APPEARANCEUR, LABSPEC, PHURINE, GLUCOSEU, HGBUR, BILIRUBINUR, KETONESUR, PROTEINUR, UROBILINOGEN, NITRITE, LEUKOCYTESUR  Radiological Exams on Admission: Ct Head Wo Contrast  Result Date: 04/05/2017 CLINICAL DATA:  36 year old male with acute altered mental status, dizziness and ataxia. EXAM: CT HEAD WITHOUT CONTRAST TECHNIQUE: Contiguous axial images were obtained from the base of the skull through the vertex without intravenous contrast. COMPARISON:  None. FINDINGS: Brain: No evidence of infarction, hemorrhage, hydrocephalus, extra-axial collection or mass lesion/mass effect. Vascular: No hyperdense vessel or unexpected calcification. Skull: Normal. Negative for fracture or focal lesion. Sinuses/Orbits: No acute finding. Other: None. IMPRESSION: Unremarkable noncontrast head CT. Electronically Signed   By:  Harmon Pier M.D.   On: 04/05/2017 09:35   Mr Brain Wo Contrast  Result Date: 04/05/2017 CLINICAL DATA:  Initial evaluation for acute ataxia, suspect stroke. EXAM: MRI HEAD WITHOUT CONTRAST TECHNIQUE: Multiplanar, multiecho pulse sequences of the brain and surrounding structures were obtained without intravenous contrast. COMPARISON:  Prior CT from earlier the same day. FINDINGS: Brain: Study degraded by motion artifact. Cerebral volume within normal limits. No obvious focal parenchymal signal abnormality. No abnormal foci of restricted diffusion to suggest acute or subacute ischemia. Gray-white matter differentiation maintained. No encephalomalacia to  suggest chronic infarction. No evidence for acute or chronic intracranial hemorrhage. No mass lesion, midline shift or mass effect. No hydrocephalus. No extra-axial fluid collection. Major dural sinuses grossly patent. Pituitary gland suprasellar region normal. Midline structures intact and normal. Vascular: Major intracranial vascular flow voids grossly maintained. Skull and upper cervical spine: Craniocervical junction normal. Upper cervical spine within normal limits. Bone marrow signal intensity normal. No scalp soft tissue abnormality. Sinuses/Orbits: Globes and orbital soft tissues grossly within normal limits. Paranasal sinuses are clear. No mastoid effusion. Other: None. IMPRESSION: Normal brain MRI.  No acute intracranial abnormality identified. Electronically Signed   By: Rise Mu M.D.   On: 04/05/2017 15:12    EKG: Independently reviewed. Vent. rate 126 BPM PR interval * ms QRS duration 85 ms QT/QTc 320/464 ms P-R-T axes 66 82 0 Sinus tachycardia Minimal ST depression, inferior leads Baseline wander in lead(s) V1 V2  Assessment/Plan Principal Problem:   Delirium tremens (HCC) Improving. No further confusion. Admit to stepdown/inpatient. Continue IV fluids. Continue CIWA protocol with lorazepam IV. Frequent neuro checks. Continue thiamine and MVI supplementation. Magnesium sulfate 2 g IVPB was ordered.  Active Problems:   Generalized anxiety disorder Currently on benzodiazepine detox protocol. He recently stopped his antidepressant.    Depression He recently stopped Lexapro due to side effects. Denies suicidal or homicidal ideations. Consider psych evaluation if he worsens while in the hospital.    Anemia Secondary to alcohol consumption. Check anemia profile. Monitor hematocrit and hemoglobin.    Thrombocytopenia (HCC) Secondary to alcohol ingestion. Monitor platelet level.    Hyponatremia Continue IV fluids. Follow-up sodium level in a.m.     Hyperglycemia Monitor blood glucose. Consider further workup if hyperglycemia is persistent.    Seizures (HCC) Resume Neurontin 100 mg by mouth 3 times a day   DVT prophylaxis: SCDs. Code Status: Full code. Family Communication: None at bedside. Disposition Plan: Admit for DTs treatment and further work up Consults called:  Admission status: Inpatient/SDU   Bobette Mo MD [facility] Pager [phone].  If 7PM-7AM, please contact night-coverage [email or web address] Password [email or web address]  04/05/2017, 9:30 PM

## 2017-04-05 NOTE — Progress Notes (Signed)
Pt admitted to 1225.  Admitting notified.  Pt very agitated, pulling at things, wanting to get out of bed to have a smoke.  Pt reassured.  Parents to bedside and updated per patient.  [place] PA notified. Orders received.

## 2017-04-05 NOTE — ED Notes (Addendum)
Sitter at [facility]. "feel better", denies questions or needs, parents at [facility]. Alert, NAD, restless, jittery/tremors, interactive, resps e/u, speaking clearly, no dyspnea noted, skin W&moist, VSS. Report given via phone to carelink, pending arrival.

## 2017-04-05 NOTE — Progress Notes (Signed)
Patient discussed with EDP at [facility] med center in [facility]. Presents with dizziness, gait instability and tachycardia at the ED. Accepted for stepdown admission for alcohol abuse with delirium tremens

## 2017-04-05 NOTE — ED Notes (Signed)
Pt returned from MRI °

## 2017-04-05 NOTE — ED Provider Notes (Signed)
[facility] Provider Note   CSN: [id number] Arrival date & time: 04/05/17  5784     History   Chief Complaint Chief Complaint  Patient presents with  . Dizziness    HPI Tony Jennings is a 36 y.o. male.  Patient is a 36 year old male with a history of prior alcohol abuse, anxiety and depression who presents with dizziness and weakness.  He states he woke up around midnight and felt very dizzy. He had the sensation of lightheadedness although now he feels like the room spinning. He has some discomfort to the back of his head. He felt like since that time he's had what he describes as confusion and generalized weakness. He denies any unilateral weakness. His mom noted this morning that he was having some slurred speech and difficulty communicating and that what he was saying wasn't really making sense. In addition he was noted to be pouring some water over his head and other bizarre behavior.  He is noted to be very anxious and shaking this morning. He denies any recent alcohol use. He denies any drug use. He denies any ingestions of his medications. He does have ongoing depression and anxiety which she is seen a therapist for. He denies any fevers, recent trauma or other recent illnesses.      Past Medical History:  Diagnosis Date  . Acute hypokalemia   . Acute hyponatremia   . Alcohol abuse   . Alcohol-induced chronic pancreatitis (HCC)   . ALD (alcoholic liver disease) (HCC)   . Anxiety   . Depression   . Hepatic steatosis   . Seizures (HCC)   . Ulnar neuropathy at elbow 10/24/2014    Patient Active Problem List   Diagnosis Date Noted  . ALD (alcoholic liver disease) (HCC) 69/62/9528  . Alcohol-induced chronic pancreatitis (HCC) 04/01/2016  . 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 New onset  seizure (HCC) 10/24/2014  . Faintness 10/24/2014  . 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 Syncope 10/05/2014  . Generalized anxiety disorder 10/05/2014  . Seizures (HCC) 10/05/2014    Past Surgical History:  Procedure Laterality Date  . WRIST SURGERY         Home Medications    Prior to Admission medications   Medication Sig Start Date End Date Taking? Authorizing Provider  escitalopram (LEXAPRO) 10 MG tablet Take 1 tablet (10 mg total) by mouth daily. 03/18/17   Thresa Ross, MD  gabapentin (NEURONTIN) 100 MG capsule Take 1 capsule (100 mg total) by mouth 4 (four) times daily. 03/18/17   Thresa Ross, MD  Multiple Vitamin (MULTIVITAMIN WITH MINERALS) TABS tablet Take 1 tablet by mouth daily.    [provider]  thiamine (VITAMIN B-1) 100 MG tablet Take 100 mg by mouth daily.    [provider]    Family History Family History  Problem Relation Age of Onset  . Hypertension Mother   . Hypertension Father     Social History Social History  Substance Use Topics  . Smoking status: Current Every Day Smoker    Packs/day: 0.25    Years: 10.00    Types: Cigarettes  . Smokeless tobacco: Never Used     Comment: reduce the # cig  . Alcohol use No     Comment: Drank heavily for years in past.  Recenlty drinking more with anxiety.      Allergies  Patient has no known allergies.   Review of Systems Review of Systems  Constitutional: Positive for fatigue. Negative for chills, diaphoresis and fever.  HENT: Negative for congestion, rhinorrhea and sneezing.   Eyes: Negative.   Respiratory: Negative for cough, chest tightness and shortness of breath.   Cardiovascular: Negative for chest pain and leg swelling.  Gastrointestinal: Negative for abdominal pain, blood in stool, diarrhea, nausea and vomiting.  Genitourinary: Negative for difficulty urinating, flank pain, frequency and hematuria.  Musculoskeletal: Negative for arthralgias and back pain.  Skin:  Negative for rash.  Neurological: Positive for dizziness, tremors, speech difficulty, weakness (generalized) and headaches. Negative for seizures, facial asymmetry and numbness.     Physical Exam Updated Vital Signs BP (!) 143/93   Pulse (!) 113   Temp 99.5 F (37.5 C) (Oral)   Resp 15   Ht  (1.753 m)   Wt 65.8 kg (145 lb)   SpO2 96%   BMI 21.41 kg/m   Physical Exam  Constitutional: He is oriented to person, place, and time. He appears well-developed and well-nourished.  Appears very anxious and is tremulous  HENT:  Head: Normocephalic and atraumatic.  Eyes: Pupils are equal, round, and reactive to light. Conjunctivae are normal.  No nystagmus, no visual field deficits  Neck: Normal range of motion. Neck supple.  Cardiovascular: Regular rhythm and normal heart sounds.  Tachycardia present.   Pulmonary/Chest: Effort normal and breath sounds normal. No respiratory distress. He has no wheezes. He has no rales. He exhibits no tenderness.  Abdominal: Soft. Bowel sounds are normal. There is no tenderness. There is no rebound and no guarding.  Musculoskeletal: Normal range of motion. He exhibits no edema.  Lymphadenopathy:    He has no cervical adenopathy.  Neurological: He is alert and oriented to person, place, and time.  Motor 5/5 all extremities Sensation grossly intact to LT all extremities Finger to Nose intact, no pronator drift CN II-XII grossly intact    Skin: Skin is warm and dry. No rash noted.  Psychiatric: He has a normal mood and affect.     ED Treatments / Results  Labs (all labs ordered are listed, but only abnormal results are displayed) Labs Reviewed  BASIC METABOLIC PANEL - Abnormal; Notable for the following:       Result Value   Sodium 133 (*)    Chloride 97 (*)    CO2 19 (*)    Glucose, Bld 175 (*)    Anion gap 17 (*)    All other components within normal limits  CBC WITH DIFFERENTIAL/PLATELET - Abnormal; Notable for the following:    RBC  3.82 (*)    Hemoglobin 10.9 (*)    HCT 34.5 (*)    RDW 17.4 (*)    Platelets 44 (*)    All other components within normal limits  ACETAMINOPHEN LEVEL - Abnormal; Notable for the following:    Acetaminophen (Tylenol), Serum <10 (*)    All other components within normal limits  ETHANOL  SALICYLATE LEVEL  RAPID URINE DRUG SCREEN, HOSP PERFORMED    EKG  EKG Interpretation  Date/Time:  Saturday April 05 2017 08:40:40 EDT Ventricular Rate:  126 PR Interval:    QRS Duration: 85 QT Interval:  320 QTC Calculation: 464 R Axis:   82 Text Interpretation:  Sinus tachycardia Minimal ST depression, inferior leads Baseline wander in lead(s) V1 V2 SINCE LAST TRACING HEART RATE HAS INCREASED Confirmed by Rolan Bucco [phone]) on 04/05/2017 8:56:10 AM  Radiology Ct Head Wo Contrast  Result Date: 04/05/2017 CLINICAL DATA:  36 year old male with acute altered mental status, dizziness and ataxia. EXAM: CT HEAD WITHOUT CONTRAST TECHNIQUE: Contiguous axial images were obtained from the base of the skull through the vertex without intravenous contrast. COMPARISON:  None. FINDINGS: Brain: No evidence of infarction, hemorrhage, hydrocephalus, extra-axial collection or mass lesion/mass effect. Vascular: No hyperdense vessel or unexpected calcification. Skull: Normal. Negative for fracture or focal lesion. Sinuses/Orbits: No acute finding. Other: None. IMPRESSION: Unremarkable noncontrast head CT. Electronically Signed   By: Harmon Pier M.D.   On: 04/05/2017 09:35    Procedures Procedures (including critical care time)  Medications Ordered in ED Medications  LORazepam (ATIVAN) injection 0-4 mg (2 mg Intravenous Given 04/05/17 1429)    Or  LORazepam (ATIVAN) tablet 0-4 mg ( Oral See Alternative 04/05/17 1429)  LORazepam (ATIVAN) injection 0-4 mg (not administered)    Or  LORazepam (ATIVAN) tablet 0-4 mg (not administered)  thiamine (VITAMIN B-1) tablet 100 mg (100 mg Oral Given 04/05/17 1429)      Or  thiamine (B-1) injection 100 mg ( Intravenous See Alternative 04/05/17 1429)  LORazepam (ATIVAN) injection 1 mg (1 mg Intravenous Given 04/05/17 0853)  LORazepam (ATIVAN) injection 1 mg (1 mg Intravenous Given 04/05/17 1035)     Initial Impression / Assessment and Plan / ED Course  I have reviewed the triage vital signs and the nursing notes.  Pertinent labs & imaging results that were available during my care of the patient were reviewed by me and considered in my medical decision making (see chart for details).  Clinical Course as of Apr 05 1508  Sat Apr 05, 2017  1352 Pt still very tremulous, having some hallucinations.  I had a frank discussion with him and he does admit to ETOH use recently.  HE DOES NOT WANT HIS PARENTS TO KNOW ABOUT THIS.  Given this information, his symptoms are consistent with DTs, will plan admission to [facility].  [MB]    Clinical Course User Index [MB] Rolan Bucco, MD    Pt is a 36yo with hx of anxiety and depression who Presents with dizziness associated with generalized weakness and bizarre behavior such as pouring water over his head and difficulty with his speech. On exam I don't find any focal neurologic deficits. He is oriented 3. However given his symptoms of dizziness and weakness, a CT head was performed which was negative for acute abnormalities. His labs are non-concerning other than he has a markedly low platelet count of 40. On review of his records, in 2016 he had low platelets radiates to his alcohol abuse. This resolved spontaneously. He currently doesn't have any signs of bleeding. No petechiae. He states his most recent alcohol use was in July. He also has hypoglycemia and some other minor lab abnormalities which can be followed up by his PCP. I discussed all these findings with the patient.`  11:45 pt feeling better.  Still slightly dizzy after ativan.  Headache resolved.  Still unable to ambulate.  Will check MR brain.  Tachycardia  improving  I spoke with Dr. Julio Sicks who will admit the patient to [facility] for DTs.  MR results pending.  Dr. Jeraldine Loots to follow up on this.  Final Clinical Impressions(s) / ED Diagnoses   Final diagnoses:  Delirium tremens [facility])    New Prescriptions New Prescriptions   No medications on file     Rolan Bucco, MD 04/05/17 1512

## 2017-04-05 NOTE — ED Triage Notes (Signed)
Pt c/o dizziness and presents with very unsteady gait. Parents advise they found pt in the floor this morning dumping water in his face and speaking incoherently. Pt denies any drug use.

## 2017-04-05 NOTE — ED Notes (Signed)
Pt denies ETOH use, states last use was in July.

## 2017-04-05 NOTE — ED Notes (Signed)
Dad at bedside

## 2017-04-05 NOTE — ED Notes (Signed)
Patient transported to MRI 

## 2017-04-05 NOTE — ED Notes (Signed)
Pt attempting to get out of bed again. Pt thinks he is in a parking lot and wants to see if his legs work

## 2017-04-05 NOTE — ED Notes (Signed)
Called for sitter from staffing office

## 2017-04-05 NOTE — ED Notes (Signed)
No changes, out with [facility], parents present.

## 2017-04-05 NOTE — ED Notes (Signed)
Pt getting more agitated, trying to get out of bed, thinks he is in a parking lot, wants to see if his legs still work. Mother at bed side. Dr Jeraldine Loots notified.

## 2017-04-05 NOTE — ED Notes (Signed)
Pt now having hallucinations and attempting to get out of the bed.

## 2017-04-06 LAB — CBC
HCT: 30.4 % — ABNORMAL LOW (ref 39.0–52.0)
Hemoglobin: 9.7 g/dL — ABNORMAL LOW (ref 13.0–17.0)
MCH: 29.3 pg (ref 26.0–34.0)
MCHC: 31.9 g/dL (ref 30.0–36.0)
MCV: 91.8 fL (ref 78.0–100.0)
PLATELETS: 48 10*3/uL — AB (ref 150–400)
RBC: 3.31 MIL/uL — ABNORMAL LOW (ref 4.22–5.81)
RDW: 17.4 % — AB (ref 11.5–15.5)
WBC: 3.9 10*3/uL — ABNORMAL LOW (ref 4.0–10.5)

## 2017-04-06 LAB — COMPREHENSIVE METABOLIC PANEL
ALT: 86 U/L — ABNORMAL HIGH (ref 17–63)
ANION GAP: 11 (ref 5–15)
AST: 352 U/L — AB (ref 15–41)
Albumin: 3.2 g/dL — ABNORMAL LOW (ref 3.5–5.0)
Alkaline Phosphatase: 163 U/L — ABNORMAL HIGH (ref 38–126)
BUN: <5 mg/dL — ABNORMAL LOW (ref 6–20)
CHLORIDE: 102 mmol/L (ref 101–111)
CO2: 21 mmol/L — ABNORMAL LOW (ref 22–32)
Calcium: 8.5 mg/dL — ABNORMAL LOW (ref 8.9–10.3)
Creatinine, Ser: 0.73 mg/dL (ref 0.61–1.24)
Glucose, Bld: 111 mg/dL — ABNORMAL HIGH (ref 65–99)
POTASSIUM: 3.5 mmol/L (ref 3.5–5.1)
Sodium: 134 mmol/L — ABNORMAL LOW (ref 135–145)
Total Bilirubin: 2.1 mg/dL — ABNORMAL HIGH (ref 0.3–1.2)
Total Protein: 6.2 g/dL — ABNORMAL LOW (ref 6.5–8.1)

## 2017-04-06 LAB — VITAMIN B12: VITAMIN B 12: 746 pg/mL (ref 180–914)

## 2017-04-06 LAB — FERRITIN: FERRITIN: 192 ng/mL (ref 24–336)

## 2017-04-06 LAB — IRON AND TIBC
Iron: 48 ug/dL (ref 45–182)
SATURATION RATIOS: 12 % — AB (ref 17.9–39.5)
TIBC: 389 ug/dL (ref 250–450)
UIBC: 341 ug/dL

## 2017-04-06 LAB — HIV ANTIBODY (ROUTINE TESTING W REFLEX): HIV Screen 4th Generation wRfx: NONREACTIVE

## 2017-04-06 LAB — FOLATE: Folate: 7.4 ng/mL (ref >5.9)

## 2017-04-06 MED ORDER — HALOPERIDOL LACTATE 5 MG/ML IJ SOLN
2.0000 mg | Freq: Once | INTRAMUSCULAR | Status: AC
  Administered 2017-04-06: 2 mg via INTRAVENOUS
  Filled 2017-04-06: qty 1

## 2017-04-06 MED ORDER — LORAZEPAM 1 MG PO TABS
1.0000 mg | ORAL_TABLET | Freq: Three times a day (TID) | ORAL | Status: DC
  Administered 2017-04-07: 1 mg via ORAL
  Filled 2017-04-06: qty 1

## 2017-04-06 MED ORDER — LORAZEPAM 2 MG/ML IJ SOLN
2.0000 mg | Freq: Once | INTRAMUSCULAR | Status: AC
  Administered 2017-04-06: 2 mg via INTRAVENOUS
  Filled 2017-04-06: qty 1

## 2017-04-06 MED ORDER — HALOPERIDOL LACTATE 5 MG/ML IJ SOLN: 2.0000 mg | Freq: Four times a day (QID) | INTRAMUSCULAR | Status: DC | PRN

## 2017-04-06 MED ORDER — GABAPENTIN 100 MG PO CAPS
100.0000 mg | ORAL_CAPSULE | Freq: Four times a day (QID) | ORAL | Status: DC
  Administered 2017-04-06 – 2017-04-09 (×13): 100 mg via ORAL
  Filled 2017-04-06 (×13): qty 1

## 2017-04-06 MED ORDER — LORAZEPAM 1 MG PO TABS: 1.0000 mg | ORAL_TABLET | Freq: Two times a day (BID) | ORAL | Status: DC

## 2017-04-06 MED ORDER — LORAZEPAM 1 MG PO TABS
1.0000 mg | ORAL_TABLET | Freq: Every day | ORAL | Status: AC
  Administered 2017-04-07: 1 mg via ORAL

## 2017-04-06 MED ORDER — METOPROLOL TARTRATE 5 MG/5ML IV SOLN: 5.0000 mg | Freq: Once | INTRAVENOUS | Status: DC

## 2017-04-06 MED ORDER — LORAZEPAM 2 MG/ML IJ SOLN
1.0000 mg | Freq: Once | INTRAMUSCULAR | Status: AC
  Administered 2017-04-06: 1 mg via INTRAVENOUS

## 2017-04-06 MED ORDER — SODIUM CHLORIDE 0.9 % IV SOLN
INTRAVENOUS | Status: DC
  Administered 2017-04-08 – 2017-04-09 (×2): via INTRAVENOUS

## 2017-04-06 MED ORDER — ADULT MULTIVITAMIN W/MINERALS CH
1.0000 | ORAL_TABLET | Freq: Every day | ORAL | Status: DC
  Administered 2017-04-07 – 2017-04-09 (×3): 1 via ORAL
  Filled 2017-04-06 (×3): qty 1

## 2017-04-06 MED ORDER — LORAZEPAM 2 MG/ML IJ SOLN
4.0000 mg | Freq: Once | INTRAMUSCULAR | Status: AC
  Administered 2017-04-06: 4 mg via INTRAVENOUS
  Filled 2017-04-06: qty 2

## 2017-04-06 MED ORDER — POTASSIUM CHLORIDE IN NACL 20-0.9 MEQ/L-% IV SOLN
INTRAVENOUS | Status: DC
  Administered 2017-04-06 (×2): via INTRAVENOUS
  Filled 2017-04-06 (×2): qty 1000

## 2017-04-06 MED ORDER — LORAZEPAM 1 MG PO TABS
1.0000 mg | ORAL_TABLET | Freq: Four times a day (QID) | ORAL | Status: DC
  Administered 2017-04-06 (×3): 1 mg via ORAL
  Filled 2017-04-06 (×3): qty 1

## 2017-04-06 MED ORDER — LOPERAMIDE HCL 2 MG PO CAPS: 2.0000 mg | ORAL_CAPSULE | ORAL | Status: AC | PRN

## 2017-04-06 MED ORDER — HYDROXYZINE HCL 25 MG PO TABS
25.0000 mg | ORAL_TABLET | Freq: Four times a day (QID) | ORAL | Status: AC | PRN
  Administered 2017-04-08: 25 mg via ORAL
  Filled 2017-04-06: qty 1

## 2017-04-06 NOTE — Progress Notes (Signed)
[facility] Progress Note  Patient: Tony Jennings YQM:[id number]   PCP: Donato Schultz, DO DOB: 16-Jun-1981   DOA: 04/05/2017   DOS: 04/06/2017   Date of Service: the patient was seen and examined on 04/06/2017  Subjective: Agitated, trying to come out of the bed. After receiving Haldol and sedated. No nausea no vomiting. No fever no chills. Tolerating oral diet.  Brief hospital course: Pt. with PMH of alcohol abuse, anemia, anxiety, depression, seizure; admitted on 04/05/2017, presented with complaint of alcohol withdrawal, was found to have delirium tremens. Currently further plan is continue IV benzo.  Assessment and Plan:  Delirium tremens (HCC) Improving. No further confusion. Admit to stepdown/inpatient. Continue IV fluids. Continue CIWA protocol with lorazepam IV. Discussed with CCM, appreciate input, currently continuing on scheduled oral benzo and IV benzo. Continue thiamine and MVI supplementation. No indication for Precedex for now.  Active Problems:   Generalized anxiety disorder Currently on benzodiazepine detox protocol. He recently stopped his antidepressant.    Depression He recently stopped Lexapro due to side effects. Denies suicidal or homicidal ideations. We will monitor for now, Psychiatric consultation tomorrow.    Anemia Secondary to alcohol consumption. Normal iron level, normal B-12. Monitor hematocrit and hemoglobin.    Thrombocytopenia (HCC) Secondary to alcohol ingestion. Monitor platelet level.    Hyponatremia Continue IV fluids. Daily BMP.    Hyperglycemia Monitor blood glucose. Consider further workup if hyperglycemia is persistent.    Seizures (HCC) Resume Neurontin 100 mg by mouth 3 times a day  Diet: Regular diet, aspiration precaution DVT Prophylaxis: subcutaneous Heparin  Advance goals of care discussion: full code  Family Communication: no family was present at bedside, at the time of interview.    Disposition:  Discharge to home.  Consultants: Psychiatry Procedures: None  Antibiotics: Anti-infectives    None       Objective: Physical Exam: Vitals:   04/06/17 1200 04/06/17 1300 04/06/17 1432 04/06/17 1500  BP: 123/89 (!) 89/64 122/88 103/71  Pulse: (!) 105     Resp: 18 19 (!) 22 20  Temp: 98.6 F (37 C)     TempSrc: Axillary     SpO2: 97% 99% 99% 98%  Weight:      Height:        Intake/Output Summary (Last 24 hours) at 04/06/17 1509 Last data filed at 04/06/17 1500  Gross per 24 hour  Intake           1787.5 ml  Output              600 ml  Net           1187.5 ml   Filed Weights   04/05/17 0843 04/05/17 2109  Weight: 65.8 kg (145 lb) 66 kg (145 lb 8.1 oz)   General: Alert, Awake and Oriented to Place and Person. Appear in mild distress, affect irritable Eyes: PERRL, Conjunctiva normal ENT: Oral Mucosa clear dry. Neck: no JVD, no Abnormal Mass Or lumps Cardiovascular: S1 and S2 Present, no Murmur, Peripheral Pulses Present Respiratory: normal respiratory effort, Bilateral Air entry equal and Decreased, no use of accessory muscle, Clear to Auscultation, no Crackles, no wheezes Abdomen: Bowel Sound poresent, Soft and no tenderness, no hernia Skin: no redness, no Rash, no induration Extremities: no Pedal edema, no calf tenderness Neurologic: Grossly no focal neuro deficit. Bilaterally Equal motor strength  Data Reviewed: CBC:  Recent Labs Lab 04/05/17 0845 04/06/17 0353  WBC 5.6 3.9*  NEUTROABS 4.5  --  HGB 10.9* 9.7*  HCT 34.5* 30.4*  MCV 90.3 91.8  PLT 44* 48*   Basic Metabolic Panel:  Recent Labs Lab 04/05/17 0845 04/05/17 2158 04/06/17 0353  NA 133* 132* 134*  K 3.7 3.0* 3.5  CL 97* 98* 102  CO2 19* 24 21*  GLUCOSE 175* 121* 111*  BUN 7 6 <5*  CREATININE 0.95 0.78 0.73  CALCIUM 9.0 9.5 8.5*  MG  --  1.7  --   PHOS  --  2.3*  --     Liver Function Tests:  Recent Labs Lab 04/05/17 2158 04/06/17 0353  AST 369* 352*  ALT  85* 86*  ALKPHOS 162* 163*  BILITOT 2.5* 2.1*  PROT 6.3* 6.2*  ALBUMIN 3.2* 3.2*    Recent Labs Lab 04/05/17 2158  LIPASE 76*   No results for input(s): AMMONIA in the last 168 hours. Coagulation Profile: No results for input(s): INR, PROTIME in the last 168 hours. Cardiac Enzymes: No results for input(s): CKTOTAL, CKMB, CKMBINDEX, TROPONINI in the last 168 hours. BNP (last 3 results) No results for input(s): PROBNP in the last 8760 hours. CBG: No results for input(s): GLUCAP in the last 168 hours. Studies: No results found.  Scheduled Meds: . gabapentin  100 mg Oral QID  . LORazepam  1 mg Oral QID   Followed by  . [START ON 04/07/2017] LORazepam  1 mg Oral TID   Followed by  . [START ON 04/08/2017] LORazepam  1 mg Oral BID   Followed by  . [START ON 04/09/2017] LORazepam  1 mg Oral Daily  . multivitamin with minerals  1 tablet Oral Daily  . nicotine  21 mg Transdermal Daily  . potassium phosphate (monobasic)  500 mg Oral TID WC  . thiamine  100 mg Oral Daily   Or  . thiamine  100 mg Intravenous Daily   Continuous Infusions: . sodium chloride 125 mL/hr at 04/06/17 0759   PRN Meds: hydrOXYzine, loperamide, LORazepam, ondansetron **OR** ondansetron (ZOFRAN) IV  Time spent: 35 minutes  Author: Lynden Oxford, MD [facility] Pager: [phone] 04/06/2017 3:09 PM  If 7PM-7AM, please contact night-coverage at [email or web address], password [facility]

## 2017-04-06 NOTE — Progress Notes (Signed)
Pt very agitated, trying to get out of bed.  X.Blount PA paged and made aware.  Ativan given.   Pt reoriented, reassured.

## 2017-04-06 NOTE — Progress Notes (Signed)
0981 RN went to give Pt his ativan dose of 0600 CIWA of 18 and he was sleeping.  Not ativan given at this time.

## 2017-04-06 NOTE — Progress Notes (Signed)
PCCM Attending:  Asked to see patient regarding possible need for Precedex infusion. Patient currently sedate. Recommended scheduled oral benzodiazepine. Patient currently on thiamine replacement as well. No indication for Precedex at this time. Continue with current plan. Please notify us if there is any acute change.  No charge today.  Donna Christen Jamison Neighbor, M.D. [facility] Pulmonary & Critical Care Pager:  [phone] After 3pm or if no response, call [phone] 11:52 AM 04/06/17

## 2017-04-07 ENCOUNTER — Inpatient Hospital Stay (HOSPITAL_COMMUNITY): Payer: 59

## 2017-04-07 DIAGNOSIS — D649 Anemia, unspecified: Secondary | ICD-10-CM

## 2017-04-07 DIAGNOSIS — Z560 Unemployment, unspecified: Secondary | ICD-10-CM

## 2017-04-07 DIAGNOSIS — F10231 Alcohol dependence with withdrawal delirium: Principal | ICD-10-CM

## 2017-04-07 DIAGNOSIS — R739 Hyperglycemia, unspecified: Secondary | ICD-10-CM

## 2017-04-07 DIAGNOSIS — D696 Thrombocytopenia, unspecified: Secondary | ICD-10-CM

## 2017-04-07 DIAGNOSIS — F17210 Nicotine dependence, cigarettes, uncomplicated: Secondary | ICD-10-CM

## 2017-04-07 LAB — COMPREHENSIVE METABOLIC PANEL
ALBUMIN: 2.8 g/dL — AB (ref 3.5–5.0)
ALT: 69 U/L — ABNORMAL HIGH (ref 17–63)
AST: 251 U/L — ABNORMAL HIGH (ref 15–41)
Alkaline Phosphatase: 128 U/L — ABNORMAL HIGH (ref 38–126)
Anion gap: 9 (ref 5–15)
BILIRUBIN TOTAL: 1.8 mg/dL — AB (ref 0.3–1.2)
BUN: <5 mg/dL — ABNORMAL LOW (ref 6–20)
CO2: 21 mmol/L — ABNORMAL LOW (ref 22–32)
Calcium: 8.2 mg/dL — ABNORMAL LOW (ref 8.9–10.3)
Chloride: 109 mmol/L (ref 101–111)
Creatinine, Ser: 0.66 mg/dL (ref 0.61–1.24)
GFR calc Af Amer: >60 mL/min (ref >60)
GLUCOSE: 105 mg/dL — AB (ref 65–99)
POTASSIUM: 3.4 mmol/L — AB (ref 3.5–5.1)
Sodium: 139 mmol/L (ref 135–145)
TOTAL PROTEIN: 5.6 g/dL — AB (ref 6.5–8.1)

## 2017-04-07 LAB — CBC WITH DIFFERENTIAL/PLATELET
BASOS PCT: 1 %
Basophils Absolute: 0 10*3/uL (ref 0.0–0.1)
Eosinophils Absolute: 0.2 10*3/uL (ref 0.0–0.7)
Eosinophils Relative: 4 %
HEMATOCRIT: 28.3 % — AB (ref 39.0–52.0)
Hemoglobin: 8.7 g/dL — ABNORMAL LOW (ref 13.0–17.0)
Lymphocytes Relative: 24 %
Lymphs Abs: 1 10*3/uL (ref 0.7–4.0)
MCH: 28.6 pg (ref 26.0–34.0)
MCHC: 30.7 g/dL (ref 30.0–36.0)
MCV: 93.1 fL (ref 78.0–100.0)
MONO ABS: 0.4 10*3/uL (ref 0.1–1.0)
MONOS PCT: 10 %
NEUTROS ABS: 2.4 10*3/uL (ref 1.7–7.7)
Neutrophils Relative %: 61 %
Platelets: 55 10*3/uL — ABNORMAL LOW (ref 150–400)
RBC: 3.04 MIL/uL — ABNORMAL LOW (ref 4.22–5.81)
RDW: 17.4 % — AB (ref 11.5–15.5)
WBC: 4 10*3/uL (ref 4.0–10.5)

## 2017-04-07 LAB — PROTIME-INR
INR: 1
Prothrombin Time: 13.1 seconds (ref 11.4–15.2)

## 2017-04-07 LAB — MAGNESIUM: MAGNESIUM: 1.8 mg/dL (ref 1.7–2.4)

## 2017-04-07 MED ORDER — VITAMIN B-1 100 MG PO TABS: 100.0000 mg | ORAL_TABLET | Freq: Every day | ORAL | Status: DC

## 2017-04-07 MED ORDER — LORAZEPAM 2 MG/ML IJ SOLN
2.0000 mg | Freq: Once | INTRAMUSCULAR | Status: AC
  Administered 2017-04-07: 2 mg via INTRAVENOUS
  Filled 2017-04-07: qty 1

## 2017-04-07 MED ORDER — DIAZEPAM 5 MG PO TABS
5.0000 mg | ORAL_TABLET | Freq: Three times a day (TID) | ORAL | Status: DC
  Administered 2017-04-07 – 2017-04-09 (×7): 5 mg via ORAL
  Filled 2017-04-07 (×7): qty 1

## 2017-04-07 MED ORDER — LORAZEPAM 2 MG/ML IJ SOLN
2.0000 mg | Freq: Once | INTRAMUSCULAR | Status: AC
  Administered 2017-04-07: 2 mg via INTRAVENOUS

## 2017-04-07 MED ORDER — THIAMINE HCL 100 MG/ML IJ SOLN
500.0000 mg | Freq: Three times a day (TID) | INTRAMUSCULAR | Status: DC
  Administered 2017-04-07 – 2017-04-09 (×6): 500 mg via INTRAVENOUS
  Filled 2017-04-07 (×7): qty 5

## 2017-04-07 MED ORDER — DEXMEDETOMIDINE HCL IN NACL 200 MCG/50ML IV SOLN
0.4000 ug/kg/h | INTRAVENOUS | Status: DC
  Administered 2017-04-07: 0.5 ug/kg/h via INTRAVENOUS
  Administered 2017-04-07: 0.2 ug/kg/h via INTRAVENOUS
  Administered 2017-04-08: 0.4 ug/kg/h via INTRAVENOUS
  Administered 2017-04-08: 0.3 ug/kg/h via INTRAVENOUS
  Filled 2017-04-07 (×4): qty 50

## 2017-04-07 NOTE — Progress Notes (Addendum)
  ativan given with no result. Pt actively trying to get OOB despite restraints. Pt becoming combative as well. Pete with critical care paged.

## 2017-04-07 NOTE — Consult Note (Signed)
PULMONARY / CRITICAL CARE MEDICINE   Name: Tony Jennings MRN: [id number] DOB: January 14, 1981    ADMISSION DATE:  04/05/2017 CONSULTATION DATE: 10/1  REFERRING MD:  Allena Katz  CHIEF COMPLAINT:  Delirium tremens  HISTORY OF PRESENT ILLNESS:   This is a 36 year old male patient with a known history of alcohol abuse, prior chronic pancreatitis in the setting of alcohol consumption, alcoholic liver disease, anemia, thrombocytopenia, anxiety, depression, and prior seizures. Recently lost job, had been sober for approximately 1 year prior reportedly. Started drinking again. Stop going to alcoholic anonymous. He was brought in by his parents on 9/29 with concern for alcohol withdrawal. He was admitted to the medical service with a working diagnosis of alcohol withdrawal, CIWA protocol was initiated. During the 12 hour span from 03/3209/1 he received over 19 mg of Ativan, he was in the stepdown unit and noted to have very impulsive and at times combative behavior. Critical care was initially asked to evaluate for possible Precedex drip but at that time on the morning of 10/1  he was awake, confused but cooperative. During the afternoon hours she again became impulsive, was having both visual and audible hallucinations, and became aggressive towards nursing staff. At that point he had received a total of 12 mg of Ativan from 800 -1400, and in spite of this remained restless, impulsive, and tremulousness. Because of this nursing staff alerted critical care to worsening symptoms thus formal critical care involvement.  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  PAST SURGICAL HISTORY: He  has a past surgical history that includes Wrist  surgery.  Allergies  Allergen Reactions  . Citalopram Other (See Comments)    Tremors    No current facility-administered medications on file prior to encounter.    Current Outpatient Prescriptions on File Prior to Encounter  Medication Sig  . gabapentin (NEURONTIN) 100 MG capsule Take 1 capsule (100 mg total) by mouth 4 (four) times daily.  . Multiple Vitamin (MULTIVITAMIN WITH MINERALS) TABS tablet Take 1 tablet by mouth daily.  Marland Kitchen thiamine (VITAMIN B-1) 100 MG tablet Take 100 mg by mouth daily.  Marland Kitchen escitalopram (LEXAPRO) 10 MG tablet Take 1 tablet (10 mg total) by mouth daily.  . [DISCONTINUED] buPROPion (WELLBUTRIN SR) 100 MG 12 hr tablet Take 1 tablet (100 mg total) by mouth daily. (Patient not taking: Reported on 02/04/2017)  . [DISCONTINUED] propranolol (INDERAL) 10 MG tablet Take 0.5 tablets (5 mg total) by mouth 2 (two) times daily.  . [DISCONTINUED] sertraline (ZOLOFT) 25 MG tablet TAKE ONE TABLET BY MOUTH ONCE DAILY    FAMILY HISTORY:  His indicated that his mother is alive. He indicated that his father is alive.    SOCIAL HISTORY: He  reports that he has been smoking Cigarettes.  He has a 2.50 pack-year smoking history. He has never used smokeless tobacco. He reports that he does not drink alcohol or use drugs.  REVIEW OF SYSTEMS:   Unable  SUBJECTIVE:  Restless, endorses he feels anxious  VITAL SIGNS: BP (!) 150/108   Pulse (!) 126   Temp 98.9 F (37.2 C) (Axillary)   Resp 20   Ht  (1.753 m)   Wt 145 lb 8.1 oz (66 kg)   SpO2 98%   BMI 21.49 kg/m   HEMODYNAMICS:  VENTILATOR SETTINGS:    INTAKE / OUTPUT: I/O last 3 completed shifts: In: 2162.5 [P.O.:240; I.V.:1872.5; IV Piggyback:50] Out: 3050 [Urine:3050]  PHYSICAL EXAMINATION: General:  This 36 year old male patient is chronically ill-appearing, he is tremulous, anxious, and impulsive at times. He is not currently in distress Neuro:  Awake, oriented, but impulsive as mentioned above,  tremulous as mentioned above, no focal motor deficits noted HEENT:  Normocephalic/atraumatic mucous membranes are moist there's no jugular venous distention Cardiovascular:  Regular rate and rhythm, he is tachycardic  Lungs:  Clear to auscultation without accessory muscle use Abdomen:  Firm, hypoactive bowel sounds Musculoskeletal:  Equal strength and bulk Skin:  Warm and dry brisk cap refill no edema  LABS:  BMET  Recent Labs Lab 04/05/17 2158 04/06/17 0353 04/07/17 0335  NA 132* 134* 139  K 3.0* 3.5 3.4*  CL 98* 102 109  CO2 24 21* 21*  BUN 6 <5* <5*  CREATININE 0.78 0.73 0.66  GLUCOSE 121* 111* 105*    Electrolytes  Recent Labs Lab 04/05/17 2158 04/06/17 0353 04/07/17 0335  CALCIUM 9.5 8.5* 8.2*  MG 1.7  --  1.8  PHOS 2.3*  --   --     CBC  Recent Labs Lab 04/05/17 0845 04/06/17 0353 04/07/17 0335  WBC 5.6 3.9* 4.0  HGB 10.9* 9.7* 8.7*  HCT 34.5* 30.4* 28.3*  PLT 44* 48* 55*    Coag's  Recent Labs Lab 04/07/17 0335  INR 1.00    Sepsis Markers No results for input(s): LATICACIDVEN, PROCALCITON, O2SATVEN in the last 168 hours.  ABG No results for input(s): PHART, PCO2ART, PO2ART in the last 168 hours.  Liver Enzymes  Recent Labs Lab 04/05/17 2158 04/06/17 0353 04/07/17 0335  AST 369* 352* 251*  ALT 85* 86* 69*  ALKPHOS 162* 163* 128*  BILITOT 2.5* 2.1* 1.8*  ALBUMIN 3.2* 3.2* 2.8*    Cardiac Enzymes No results for input(s): TROPONINI, PROBNP in the last 168 hours.  Glucose No results for input(s): GLUCAP in the last 168 hours.  Imaging US Liver Doppler  Result Date: 04/07/2017 CLINICAL DATA:  History of alcohol abuse and delirium tremens. Thrombocytopenia. EXAM: DUPLEX ULTRASOUND OF LIVER TECHNIQUE: Color and duplex Doppler ultrasound was performed to evaluate the hepatic in-flow and out-flow vessels. COMPARISON:  None. FINDINGS: Portal Vein Velocities Main:  22 cm/sec Right:  11 cm/sec Left:  19 cm/sec Hepatic Vein Velocities  Right:  32 cm/sec Middle:  24 cm/sec Left:  20 cm/sec Hepatic Artery Velocity:  82 cm/sec Splenic Vein Velocity:  35 cm/sec Varices: Absent Ascites: Absent Technical limitations due to patient motion. Normal direction of flow of the portal veins toward the liver. Normal direction of the hepatic veins away from the liver. Main portal vein measures 1.1 cm. IVC is patent. Spleen measures 10.5 x 3.9 x 13.0 cm for a calculated splenic volume of 275 mL. Normal appearance of the spleen. IMPRESSION: Normal liver duplex examination. Portal veins are patent with normal direction of flow. Electronically Signed   By: Richarda Overlie M.D.   On: 04/07/2017 12:27     STUDIES:  10/1: Ultrasound liver: Normal liver, normal portal vein flow  CULTURES:   ANTIBIOTICS:   SIGNIFICANT EVENTS:   LINES/TUBES:   DISCUSSION:   ASSESSMENT / PLAN:  Acute alcohol withdrawal with delirium tremens Failing CIWA protocol Plan Discontinue CIWA protocol Initiate Precedex infusion with gradual RASS 0 to -1 We will continue scheduled Valium Continue thiamine and folate Continue seizure precautions Continue ICU monitoring Continue telemetry  watching for bradycardia, also hypotension related to Precedex Continue IV hydration  Sinus tachycardia and hypertension in the setting of withdrawal Plan Continue telemetry monitoring, Starting Precedex, depending on this we'll decide on further therapy  Abnormal LFTs in the setting of alcohol abuse  This is improving Plan Continue supportive care, needs to stop drinking alcohol  Hypokalemia Plan Replace Recheck in a.m.  History of anxiety and depression. Plan Holding home Lexapro, has been off this for some time.  History of seizure disorder Plan Continue Neurontin 3 times a day   Anemia and thrombocytopenia. Felt secondary to long-term alcohol abuse. No evidence of bleeding at this point. Plan Trend CBC PAS  lower extremities   FAMILY  - Updates:   -  Inter-disciplinary family meet or Palliative Care meeting due by: 10/8   My critical care time 40 minutes Simonne Martinet ACNP-BC [facility] Pulmonary/Critical Care Pager # [phone] OR # [phone] if no answer  04/07/2017, 2:11 PM

## 2017-04-07 NOTE — Progress Notes (Signed)
Asked to see for possible Precedex infusion. Currently resting in bed. Has family at bedside. Received approximately 19 mg of Ativan over the last 12 hours however now is comfortable and interactive although confused.  Plan/rec Continue CIWA protocol No indication for Precedex infusion at this time Have spoke with nursing staff, should sedation needs for safety requirements indicate they will page critical care team.  No charge  Simonne Martinet ACNP-BC [name] Pulmonary/Critical Care Pager # [phone] OR # [phone] if no answer

## 2017-04-07 NOTE — Consult Note (Signed)
[place] [facility]iatry Consult   Reason for Consult:  Alcohol withdrawal and capacity evaluation Referring Physician:  Dr. Posey Pronto Patient Identification: Tony Jennings MRN:  [id number] Principal Diagnosis: Delirium tremens [facility]) Diagnosis:   Patient Active Problem List   Diagnosis Date Noted  . Delirium tremens ([place]) [F10.231] 04/05/2017  . Depression [F32.9] 04/05/2017  . Anemia [D64.9] 04/05/2017  . Thrombocytopenia ([place]) [D69.6] 04/05/2017  . Hyponatremia [E87.1] 04/05/2017  . Hyperglycemia [R73.9] 04/05/2017  . ALD (alcoholic liver disease) ([place]) [K70.9] 04/01/2016  . Alcohol-induced chronic pancreatitis ([place]) [K86.0] 04/01/2016  . Localization-related idiopathic epilepsy and epileptic syndromes with seizures of localized onset, not intractable, without status epilepticus ([place]) [G40.009] 01/13/2015  . Polyradiculoneuropathy ([place]) [G61.0] 01/13/2015  . New onset seizure ([place]) [R56.9] 10/24/2014  . Faintness [R55] 10/24/2014  . Ulnar neuropathy at elbow [G56.20] 10/24/2014  . Syncope [R55] 10/05/2014  . Generalized anxiety disorder [F41.1] 10/05/2014  . Seizures ([place]) [R56.9] 10/05/2014    Total Time spent with patient: 1 hour  Subjective:   Tony Jennings is a 36 y.o. male patient admitted with AMS and alcohol withdrawal delirium  HPI:  Tony Jennings is a 36 y.o. male with medical history significant of alcohol abuse, alcohol induced chronic pancreatitis, alcoholic liver disease, anemia, thrombocytopenia, anxiety, depression, seizures, ulnar neuropathy at the elbow admitted for treatment for alcohol withdrawal delirium. Patient mother is at bed side and participated in this evaluation with patent consent. Patient reportedly drinking alcohol since he was age 2. He has been sober about a year after rehab treatment at [facility]. He was laid off in November 2017, and later found another job which lasted only 6-8 weeks due to quitting because he felt they are not fair  and abusing his services and since than actively searching for job and staying with his mother for the last two months. He has significant other with a long term relationship and sponsor at [employer]. He stopped attending AA and started drinking secretely, mostly liquor about one fifth a day for the whole month of September until he ran out of his sources. He was found by his mother incoherently and could not stand on his feet. He was found with hallucinating seeking water fountain at his bed side and somebody standing by next to his head side etc. He was in soft restraints on his both wrists due to trying to get out of bed while delirious. Patient has history of depression and anxiety and was taken Lexapro and Gabapentin. He is not on ativan detox treatment. He is hesitant about rehab and may consider [facility] when more cleared in his delirious state. He is some what coherent and able to regain most of his cognition during my visit.   Past Psychiatric History: He has no previous acute psych admissions. Patient has been treated by Dr. Nicole Cella at [facility] at [place], [place] and was previously received multiple rehab treatments including [facility] and [facility], which was the last one, helpful until relapsed on alcohol.  Risk to Self: Is patient at risk for suicide?: No Risk to Others:   Prior Inpatient Therapy:   Prior Outpatient Therapy:    Past Medical History:  Past Medical History:  Diagnosis Date  . Acute hypokalemia   . Acute hyponatremia   . Alcohol abuse   . Alcohol-induced chronic pancreatitis ([place])   . ALD (alcoholic liver disease) ([place])   . Anemia 04/05/2017  . Anxiety   . Depression   . Hepatic steatosis   . Seizures (  [place])   . 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 WRIST SURGERY     Family History:  Family History  Problem Relation Age of Onset  . Hypertension Mother   . Hypertension Father    Family Psychiatric  History: denied  alcohol abuse and mental illness. Social History:  History  Alcohol Use No    Comment: Drank heavily for years in past.  Recenlty drinking more with anxiety.      History  Drug Use No    Comment: prior use marijuana, snorted ritalin, rare cocain. (But this was when 36 yo)    Social History   Social History  . Marital status: Divorced    Spouse name: N/A  . Number of children: N/A  . Years of education: N/A   Occupational History  . Corporate treasurer    Social History Main Topics  . Smoking status: Current Every Day Smoker    Packs/day: 0.25    Years: 10.00    Types: Cigarettes  . Smokeless tobacco: Never Used     Comment: reduce the # cig  . Alcohol use No     Comment: Drank heavily for years in past.  Recenlty drinking more with anxiety.   . Drug use: No     Comment: prior use marijuana, snorted ritalin, rare cocain. (But this was when 36 yo)  . Sexual activity: Yes    Partners: Female   Other Topics Concern  . None   Social History Narrative  . None   Additional Social History:    Allergies:   Allergies  Allergen Reactions  . Citalopram Other (See Comments)    Tremors    Labs:  Results for orders placed or performed during the hospital encounter of 04/05/17 (from the past 48 hour(s))  Urine rapid drug screen (hosp performed)     Status: None   Collection Time: 04/05/17 11:12 AM  Result Value Ref Range   Opiates NONE DETECTED NONE DETECTED   Cocaine NONE DETECTED NONE DETECTED   Benzodiazepines NONE DETECTED NONE DETECTED   Amphetamines NONE DETECTED NONE DETECTED   Tetrahydrocannabinol NONE DETECTED NONE DETECTED   Barbiturates NONE DETECTED NONE DETECTED    Comment:        DRUG SCREEN FOR MEDICAL PURPOSES ONLY.  IF CONFIRMATION IS NEEDED FOR ANY PURPOSE, NOTIFY LAB WITHIN 5 DAYS.        LOWEST DETECTABLE LIMITS FOR URINE DRUG SCREEN Drug Class       Cutoff (ng/mL) Amphetamine      1000 Barbiturate      200 Benzodiazepine   673 Tricyclics        419 Opiates          300 Cocaine          300 THC              50   MRSA PCR Screening     Status: None   Collection Time: 04/05/17  9:08 PM  Result Value Ref Range   MRSA by PCR NEGATIVE NEGATIVE    Comment:        The GeneXpert MRSA Assay (FDA approved for NASAL specimens only), is one component of a comprehensive MRSA colonization surveillance program. It is not intended to diagnose MRSA infection nor to guide or monitor treatment for MRSA infections.   Magnesium     Status: None   Collection Time: 04/05/17  9:58 PM  Result Value  Ref Range   Magnesium 1.7 1.7 - 2.4 mg/dL  Phosphorus     Status: Abnormal   Collection Time: 04/05/17  9:58 PM  Result Value Ref Range   Phosphorus 2.3 (L) 2.5 - 4.6 mg/dL  Comprehensive metabolic panel     Status: Abnormal   Collection Time: 04/05/17  9:58 PM  Result Value Ref Range   Sodium 132 (L) 135 - 145 mmol/L   Potassium 3.0 (L) 3.5 - 5.1 mmol/L   Chloride 98 (L) 101 - 111 mmol/L   CO2 24 22 - 32 mmol/L   Glucose, Bld 121 (H) 65 - 99 mg/dL   BUN 6 6 - 20 mg/dL   Creatinine, Ser 0.78 0.61 - 1.24 mg/dL   Calcium 9.5 8.9 - 10.3 mg/dL   Total Protein 6.3 (L) 6.5 - 8.1 g/dL   Albumin 3.2 (L) 3.5 - 5.0 g/dL   AST 369 (H) 15 - 41 U/L   ALT 85 (H) 17 - 63 U/L   Alkaline Phosphatase 162 (H) 38 - 126 U/L   Total Bilirubin 2.5 (H) 0.3 - 1.2 mg/dL   GFR calc non Af Amer >60 >60 mL/min   GFR calc Af Amer >60 >60 mL/min    Comment: (NOTE) The eGFR has been calculated using the CKD EPI equation. This calculation has not been validated in all clinical situations. eGFR's persistently <60 mL/min signify possible Chronic Kidney Disease.    Anion gap 10 5 - 15  Lipase, blood     Status: Abnormal   Collection Time: 04/05/17  9:58 PM  Result Value Ref Range   Lipase 76 (H) 11 - 51 U/L  Vitamin B12     Status: None   Collection Time: 04/05/17  9:58 PM  Result Value Ref Range   Vitamin B-12 746 180 - 914 pg/mL    Comment: (NOTE) This  assay is not validated for testing neonatal or myeloproliferative syndrome specimens for Vitamin B12 levels. Performed at [facility], [place] [address] [place], [place] [address]   Folate     Status: None   Collection Time: 04/05/17  9:58 PM  Result Value Ref Range   Folate 7.4 >5.9 ng/mL    Comment: Performed at [facility], [place] [address] [place], [place] [address]  Iron and TIBC     Status: Abnormal   Collection Time: 04/05/17  9:58 PM  Result Value Ref Range   Iron 48 45 - 182 ug/dL   TIBC 389 250 - 450 ug/dL   Saturation Ratios 12 (L) 17.9 - 39.5 %   UIBC 341 ug/dL    Comment: Performed at [facility], [place] [address] [place], [place] [address]  Ferritin     Status: None   Collection Time: 04/05/17  9:58 PM  Result Value Ref Range   Ferritin 192 24 - 336 ng/mL    Comment: Performed at [facility] [address] [place], [place] [address]  Reticulocytes     Status: Abnormal   Collection Time: 04/05/17  9:58 PM  Result Value Ref Range   Retic Ct Pct 4.1 (H) 0.4 - 3.1 %   RBC. 3.37 (L) 4.22 - 5.81 MIL/uL   Retic Count, Absolute 138.2 19.0 - 186.0 K/uL  HIV antibody (Routine Testing)     Status: None   Collection Time: 04/06/17  3:53 AM  Result Value Ref Range   HIV Screen 4th Generation wRfx Non Reactive Non Reactive  Comment: (NOTE) Performed At: [facility] [place], [place] [id number] Lindon Romp MD [id number]   CBC     Status: Abnormal   Collection Time: 04/06/17  3:53 AM  Result Value Ref Range   WBC 3.9 (L) 4.0 - 10.5 K/uL   RBC 3.31 (L) 4.22 - 5.81 MIL/uL   Hemoglobin 9.7 (L) 13.0 - 17.0 g/dL   HCT 30.4 (L) 39.0 - 52.0 %   MCV 91.8 78.0 - 100.0 fL   MCH 29.3 26.0 - 34.0 pg   MCHC 31.9 30.0 - 36.0 g/dL   RDW 17.4 (H) 11.5 - 15.5 %   Platelets 48 (L) 150 - 400 K/uL    Comment: REPEATED TO VERIFY SPECIMEN CHECKED FOR CLOTS PLATELET COUNT CONFIRMED BY SMEAR   Comprehensive metabolic panel      Status: Abnormal   Collection Time: 04/06/17  3:53 AM  Result Value Ref Range   Sodium 134 (L) 135 - 145 mmol/L   Potassium 3.5 3.5 - 5.1 mmol/L   Chloride 102 101 - 111 mmol/L   CO2 21 (L) 22 - 32 mmol/L   Glucose, Bld 111 (H) 65 - 99 mg/dL   BUN <5 (L) 6 - 20 mg/dL   Creatinine, Ser 0.73 0.61 - 1.24 mg/dL   Calcium 8.5 (L) 8.9 - 10.3 mg/dL   Total Protein 6.2 (L) 6.5 - 8.1 g/dL   Albumin 3.2 (L) 3.5 - 5.0 g/dL   AST 352 (H) 15 - 41 U/L   ALT 86 (H) 17 - 63 U/L   Alkaline Phosphatase 163 (H) 38 - 126 U/L   Total Bilirubin 2.1 (H) 0.3 - 1.2 mg/dL   GFR calc non Af Amer >60 >60 mL/min   GFR calc Af Amer >60 >60 mL/min    Comment: (NOTE) The eGFR has been calculated using the CKD EPI equation. This calculation has not been validated in all clinical situations. eGFR's persistently <60 mL/min signify possible Chronic Kidney Disease.    Anion gap 11 5 - 15  CBC with Differential/Platelet     Status: Abnormal   Collection Time: 04/07/17  3:35 AM  Result Value Ref Range   WBC 4.0 4.0 - 10.5 K/uL   RBC 3.04 (L) 4.22 - 5.81 MIL/uL   Hemoglobin 8.7 (L) 13.0 - 17.0 g/dL   HCT 28.3 (L) 39.0 - 52.0 %   MCV 93.1 78.0 - 100.0 fL   MCH 28.6 26.0 - 34.0 pg   MCHC 30.7 30.0 - 36.0 g/dL   RDW 17.4 (H) 11.5 - 15.5 %   Platelets 55 (L) 150 - 400 K/uL    Comment: SPECIMEN CHECKED FOR CLOTS REPEATED TO VERIFY PLATELET COUNT CONFIRMED BY SMEAR    Neutrophils Relative % 61 %   Neutro Abs 2.4 1.7 - 7.7 K/uL   Lymphocytes Relative 24 %   Lymphs Abs 1.0 0.7 - 4.0 K/uL   Monocytes Relative 10 %   Monocytes Absolute 0.4 0.1 - 1.0 K/uL   Eosinophils Relative 4 %   Eosinophils Absolute 0.2 0.0 - 0.7 K/uL   Basophils Relative 1 %   Basophils Absolute 0.0 0.0 - 0.1 K/uL  Comprehensive metabolic panel     Status: Abnormal   Collection Time: 04/07/17  3:35 AM  Result Value Ref Range   Sodium 139 135 - 145 mmol/L   Potassium 3.4 (L) 3.5 - 5.1 mmol/L   Chloride 109 101 - 111 mmol/L   CO2 21 (L)  22 - 32 mmol/L  Glucose, Bld 105 (H) 65 - 99 mg/dL   BUN <5 (L) 6 - 20 mg/dL   Creatinine, Ser 0.66 0.61 - 1.24 mg/dL   Calcium 8.2 (L) 8.9 - 10.3 mg/dL   Total Protein 5.6 (L) 6.5 - 8.1 g/dL   Albumin 2.8 (L) 3.5 - 5.0 g/dL   AST 251 (H) 15 - 41 U/L   ALT 69 (H) 17 - 63 U/L   Alkaline Phosphatase 128 (H) 38 - 126 U/L   Total Bilirubin 1.8 (H) 0.3 - 1.2 mg/dL   GFR calc non Af Amer >60 >60 mL/min   GFR calc Af Amer >60 >60 mL/min    Comment: (NOTE) The eGFR has been calculated using the CKD EPI equation. This calculation has not been validated in all clinical situations. eGFR's persistently <60 mL/min signify possible Chronic Kidney Disease.    Anion gap 9 5 - 15  Magnesium     Status: None   Collection Time: 04/07/17  3:35 AM  Result Value Ref Range   Magnesium 1.8 1.7 - 2.4 mg/dL  Protime-INR     Status: None   Collection Time: 04/07/17  3:35 AM  Result Value Ref Range   Prothrombin Time 13.1 11.4 - 15.2 seconds   INR 1.00     Current Facility-Administered Medications  Medication Dose Route Frequency Provider Last Rate Last Dose  . 0.9 %  sodium chloride infusion   Intravenous Continuous Lavina Hamman, MD 125 mL/hr at 04/06/17 0759    . gabapentin (NEURONTIN) capsule 100 mg  100 mg Oral QID Reubin Milan, MD   100 mg at 04/07/17 0800  . hydrOXYzine (ATARAX/VISTARIL) tablet 25 mg  25 mg Oral Q6H PRN Lavina Hamman, MD      . loperamide (IMODIUM) capsule 2-4 mg  2-4 mg Oral PRN Lavina Hamman, MD      . LORazepam (ATIVAN) injection 2-3 mg  2-3 mg Intravenous Q1H PRN Reubin Milan, MD   3 mg at 04/07/17 7680  . LORazepam (ATIVAN) tablet 1 mg  1 mg Oral TID Lavina Hamman, MD   1 mg at 04/07/17 1000   Followed by  . [START ON 04/08/2017] LORazepam (ATIVAN) tablet 1 mg  1 mg Oral BID Lavina Hamman, MD      . multivitamin with minerals tablet 1 tablet  1 tablet Oral Daily Lavina Hamman, MD   1 tablet at 04/07/17 0800  . nicotine (NICODERM CQ - dosed in mg/24  hours) patch 21 mg  21 mg Transdermal Daily Blount, Xenia T, NP   21 mg at 04/07/17 0801  . ondansetron (ZOFRAN) tablet 4 mg  4 mg Oral Q6H PRN Reubin Milan, MD       Or  . ondansetron [facility]) injection 4 mg  4 mg Intravenous Q6H PRN Reubin Milan, MD      . thiamine (VITAMIN B-1) tablet 100 mg  100 mg Oral Daily Malvin Johns, MD   100 mg at 04/07/17 8811   Or  . thiamine (B-1) injection 100 mg  100 mg Intravenous Daily Malvin Johns, MD   100 mg at 04/06/17 1055    Musculoskeletal: Strength & Muscle Tone: decreased Gait & Station: unable to stand Patient leans: N/A  Psychiatric Specialty Exam: Physical Exam as per history and physical  ROS Hand tremors and legs shaking. He is restless in his bed with depression and anxiety.  No Fever-chills, No Headache, No changes with Vision or hearing, reports  vertigo No problems swallowing food or Liquids, No Chest pain, Cough or Shortness of Breath, No Abdominal pain, No Nausea or Vommitting, Bowel movements are regular, No Blood in stool or Urine, No dysuria, No new skin rashes or bruises, No new joints pains-aches,  No new weakness, tingling, numbness in any extremity, No recent weight gain or loss, No polyuria, polydypsia or polyphagia,   A full 10 point Review of Systems was done, except as stated above, all other Review of Systems were negative.  Blood pressure (!) 144/104, pulse (!) 126, temperature (!) 97.5 F (36.4 C), temperature source Oral, resp. rate (!) 24, height 5' 9"  (1.753 m), weight 66 kg (145 lb 8.1 oz), SpO2 99 %.Body mass index is 21.49 kg/m.  General Appearance: Disheveled and Guarded  Eye Contact:  Fair  Speech:  Clear and Coherent and Slow  Volume:  Decreased  Mood:  Anxious and Depressed  Affect:  Constricted and Depressed  Thought Process:  Coherent and Goal Directed  Orientation:  Full (Time, Place, and Person)  Thought Content:  Rumination  Suicidal Thoughts:  No  Homicidal Thoughts:  No   Memory:  Immediate;   Good Recent;   Fair Remote;   Fair  Judgement:  Impaired  Insight:  Fair  Psychomotor Activity:  Decreased, Psychomotor Retardation and Restlessness  Concentration:  Concentration: Fair and Attention Span: Fair  Recall:  [employer] of Knowledge:  Good  Language:  Good  Akathisia:  Negative  Handed:  Right  AIMS (if indicated):     Assets:  Communication Skills Desire for Improvement Housing Intimacy Leisure Time Resilience Social Support Transportation  ADL's:  Impaired  Cognition:  Impaired,  Mild  Sleep:        Treatment Plan Summary: Patient presented with alcohol withdrawal delirium with hallucinations and history of depression and anxiety. He has significant stresses due to lack of regular job and relapsed on alcohol over a month ago.   Alcohol withdrawal delirium - slowly regaining his cognition Major depressive disorder, recurrent  Patient meets criteria for capacity to make his own medical decisions and living arrangement as per my evaluation today  Recommendation: Continue Ativan detox treatment as scheduled [facility] protocol Monitor for withdrawal seizures May restart Lexapro 10 mg daily for depression when medically cleared  Daily contact with patient to assess and evaluate symptoms and progress in treatment and Medication management  Disposition: Refer to substance abuse rehabilitation when medically cleared No evidence of imminent risk to self or others at present.   Patient does not meet criteria for psychiatric inpatient admission. Supportive therapy provided about ongoing stressors.  Ambrose Finland, MD 04/07/2017 10:00 AM

## 2017-04-07 NOTE — Care Management Note (Signed)
Case Management Note  Patient Details  Name: Tony Jennings MRN: [id number] Date of Birth: 1981/05/04  Subjective/Objective:                  Pt. with PMH of alcohol abuse, anemia, anxiety, depression, seizure; admitted on 04/05/2017, presented with complaint of alcohol withdrawal, was found to have delirium tremens. Currently further plan is continue IV benzo.   Action/Plan:  Date:  April 07, 2017 Chart reviewed for concurrent status and case management needs.  Will continue to follow patient progress.  Discharge Planning: following for needs  Expected discharge date: April 10, 2017  Marcelle Smiling, BSN, [place], [place]   [phone]   Expected Discharge Date:  04/07/17               Expected Discharge Plan:  Home/Self Care  In-House Referral:     Discharge planning Services  CM Consult  Post Acute Care Choice:    Choice offered to:     DME Arranged:    DME Agency:     HH Arranged:    HH Agency:     Status of Service:  In process, will continue to follow  If discussed at Long Length of Stay Meetings, dates discussed:    Additional Comments:  Golda Acre, RN 04/07/2017, 9:28 AM

## 2017-04-07 NOTE — Clinical Social Work Note (Signed)
Clinical Social Work Assessment  Patient Details  Name: Tony Jennings MRN: [id number] Date of Birth: June 15, 1981  Date of referral:  04/07/17               Reason for consult:  Family Concerns, Discharge Planning, [employer], Substance Use/ETOH Abuse                Permission sought to share information with:  Family Supports Permission granted to share information::  Yes, Verbal Permission Granted  Name::        Agency::     Relationship::  Father: Leonte Horrigan Information:     Housing/Transportation Living arrangements for the past 2 months:  Single Family Home Source of Information:  Other (Comment Required), Parent, Medical Team (Parent) Patient Interpreter Needed:  None Criminal Activity/Legal Involvement Pertinent to Current Situation/Hospitalization:  No - Comment as needed Significant Relationships:  Other Family Members, Significant Other, Parents Lives with:  Parents Do you feel safe going back to the place where you live?  Yes (However, parents would really like patient to return to rehab) Need for family participation in patient care:  Yes (Comment) (request)  Care giving concerns:  Consult completed with father who brought patient into hospital after noticing he was not at baseline and intoxicated/withdrawling. Patient at this time is agitated, in ICU going through current withdrawal.  Father reports patient needs to return to rehab as he has been self medicating due to loss of job and increased depression. Patient was living with Marchelle Folks his SO and asked to stay with parents due to being difficult to live with.  Father was unaware patient was actively drinking.  Reports he has been sober for almost a year and completed a 28 day program at [facility] in May 2017. He was doing well per father, working and going to [employer]. He had a sponsor and was following with a Therapist, sports in [place].  In the last 2 months patient has stopped taking he depression  medication (lexapro) and stopped going to AA because "they are narrow minded". Father reports his sponsor also left him because of his own personal issues and he did not have another sponsor.   Father and mother both involved with patient and very supportive.  Both are wanting rehab options if patient will be agreeable.  Unclear if patient can return home as father and mother need to talk to patient.   Social Worker assessment / plan:  Assessment and consult for substance abuse: active ETOH. Completed collateral information with patient father.  Will discuss options with patient once able to participate. Father reports patient has not been coherent since last Thursday. Father works part time and mother currently home as well.    Patient has good support and once stable and able to participate in planning, LCSW will assess his willingness and plans to make change/ return to rehab.  Plan: TBD.  Employment status:  Unemployed (Patient was laid off due to company. He was not fired, but has been out of work) Health and safety inspector:  Managed Care (Father reports [employer] is active) PT Recommendations:  Not assessed at this time Information / Referral to community resources:  Residential Substance Abuse Treatment Options, Outpatient Substance Abuse Treatment Options  Patient/Family's Response to care:  Father voices understanding  Patient/Family's Understanding of and Emotional Response to Diagnosis, Current Treatment, and Prognosis:  Father voices concerns for patient and getting home the help he needs. Reports he was doing very well post rehab in  May of 2017 and feels the job loss has spiraled him to self medicate again.  Emotional Assessment Appearance:  Appears stated age Attitude/Demeanor/Rapport:  Reactive, Other (Agitated, actively withdrawling) Affect (typically observed):  Agitated Orientation:  Oriented to Self Alcohol / Substance use:  Alcohol Use (history of etoh, and current use on  admission.) Psych involvement (Current and /or in the community):  Yes (Comment), Outpatient Provider (consult placed)  Discharge Needs  Concerns to be addressed:  Patient refuses services, Substance Abuse Concerns, Mental Health Concerns Readmission within the last 30 days:  No Current discharge risk:  Substance Abuse Barriers to Discharge:  Continued Medical Work up   Raye Sorrow, LCSW 04/07/2017, 9:39 AM

## 2017-04-07 NOTE — Progress Notes (Signed)
[facility] Progress Note  Patient: Tony Jennings WUJ:[id number]   PCP: Donato Schultz, DO DOB: [birth year]   DOA: 04/05/2017   DOS: 04/07/2017   Date of Service: the patient was seen and examined on 04/07/2017  Subjective: calm on my evaluation. No chest pain or shortness of breath no nausea no vomiting. On further questioning [place] me that I can only stay in the hospital until tomorrow after that I will be leaving the hospital. No diarrhea no constipation.  Brief hospital course: Pt. with PMH of alcohol abuse, anemia, anxiety, depression, seizure; admitted on 04/05/2017, presented with complaint of alcohol withdrawal, was found to have delirium tremens. Currently further plan is continue IV benzo and Precedex.  Assessment and Plan:  Delirium tremens (HCC) Improving. No further confusion. Admit to stepdown/inpatient. Continue IV fluids. Continue CIWA protocol with lorazepam IV. Discussed with CCM, appreciate input, continue by mouth Valium as well as IV Precedex. Highly appreciate their help in managing this patient. Continue thiamine and MVI supplementation.  Active Problems:   Generalized anxiety disorder Currently on benzodiazepine detox protocol. He recently stopped his antidepressant.    Depression He recently stopped Lexapro due to side effects. Denies suicidal or homicidal ideations. Psychiatric consulted. Per them, "Patient meets criteria for capacity to make his own medical decisions and living arrangement as per my evaluation today; May restart Lexapro 10 mg daily for depression when medically cleared".    Anemia Secondary to alcohol consumption. Normal iron level, normal B-12. Monitor hematocrit and hemoglobin.    Thrombocytopenia (HCC) Secondary to alcohol ingestion. Monitor platelet level.    Hyponatremia Continue IV fluids. Daily BMP.    Hyperglycemia Monitor blood glucose. Consider further workup if hyperglycemia is persistent.   Seizures (HCC) Resume Neurontin 100 mg by mouth 3 times a day    Alcohol-induced hepatitis. Patient has elevated LFTs with AST and ALT ratio consistent with alcohol hepatitis. Based on bilirubin level as well as INR maddrey df score is 8. Currently conservative management.  Diet: Regular diet, aspiration precaution DVT Prophylaxis: subcutaneous Heparin  Advance goals of care discussion: full code  Family Communication: family was present at bedside, at the time of interview. Patient provided permission. She had questions regarding what to do after the discharge. I mentioned that I will consult social worker for alcohol rehabilitation.  Disposition:  Discharge to home.  Consultants: Psychiatry, PCCM  Procedures: None  Antibiotics: Anti-infectives    None       Objective: Physical Exam: Vitals:   04/07/17 1200 04/07/17 1530 04/07/17 1600 04/07/17 1630  BP:  130/77  (!) 136/99  Pulse:      Resp:  (!) 24  (!) 25  Temp: 98.9 F (37.2 C)  98 F (36.7 C)   TempSrc: Axillary  Oral   SpO2:  96%    Weight:      Height:        Intake/Output Summary (Last 24 hours) at 04/07/17 1716 Last data filed at 04/07/17 1500  Gross per 24 hour  Intake          2750.77 ml  Output             2450 ml  Net           300.77 ml   Filed Weights   04/05/17 0843 04/05/17 2109  Weight: 65.8 kg (145 lb) 66 kg (145 lb 8.1 oz)   General: Alert, Awake and Oriented to Place and Person. Appear in mild distress, affect irritable Eyes:  PERRL, Conjunctiva normal ENT: Oral Mucosa clear dry. Neck: no JVD, no Abnormal Mass Or lumps Cardiovascular: S1 and S2 Present, no Murmur, Peripheral Pulses Present Respiratory: normal respiratory effort, Bilateral Air entry equal and Decreased, no use of accessory muscle, Clear to Auscultation, no Crackles, no wheezes Abdomen: Bowel Sound poresent, Soft and no tenderness, no hernia Skin: no redness, no Rash, no induration Extremities: no Pedal edema, no calf  tenderness Neurologic: Grossly no focal neuro deficit. Bilaterally Equal motor strength  Data Reviewed: CBC:  Recent Labs Lab 04/05/17 0845 04/06/17 0353 04/07/17 0335  WBC 5.6 3.9* 4.0  NEUTROABS 4.5  --  2.4  HGB 10.9* 9.7* 8.7*  HCT 34.5* 30.4* 28.3*  MCV 90.3 91.8 93.1  PLT 44* 48* 55*   Basic Metabolic Panel:  Recent Labs Lab 04/05/17 0845 04/05/17 2158 04/06/17 0353 04/07/17 0335  NA 133* 132* 134* 139  K 3.7 3.0* 3.5 3.4*  CL 97* 98* 102 109  CO2 19* 24 21* 21*  GLUCOSE 175* 121* 111* 105*  BUN 7 6 <5* <5*  CREATININE 0.95 0.78 0.73 0.66  CALCIUM 9.0 9.5 8.5* 8.2*  MG  --  1.7  --  1.8  PHOS  --  2.3*  --   --     Liver Function Tests:  Recent Labs Lab 04/05/17 2158 04/06/17 0353 04/07/17 0335  AST 369* 352* 251*  ALT 85* 86* 69*  ALKPHOS 162* 163* 128*  BILITOT 2.5* 2.1* 1.8*  PROT 6.3* 6.2* 5.6*  ALBUMIN 3.2* 3.2* 2.8*    Recent Labs Lab 04/05/17 2158  LIPASE 76*   No results for input(s): AMMONIA in the last 168 hours. Coagulation Profile:  Recent Labs Lab 04/07/17 0335  INR 1.00   Cardiac Enzymes: No results for input(s): CKTOTAL, CKMB, CKMBINDEX, TROPONINI in the last 168 hours. BNP (last 3 results) No results for input(s): PROBNP in the last 8760 hours. CBG: No results for input(s): GLUCAP in the last 168 hours. Studies: US Liver Doppler  Result Date: 04/07/2017 CLINICAL DATA:  History of alcohol abuse and delirium tremens. Thrombocytopenia. EXAM: DUPLEX ULTRASOUND OF LIVER TECHNIQUE: Color and duplex Doppler ultrasound was performed to evaluate the hepatic in-flow and out-flow vessels. COMPARISON:  None. FINDINGS: Portal Vein Velocities Main:  22 cm/sec Right:  11 cm/sec Left:  19 cm/sec Hepatic Vein Velocities Right:  32 cm/sec Middle:  24 cm/sec Left:  20 cm/sec Hepatic Artery Velocity:  82 cm/sec Splenic Vein Velocity:  35 cm/sec Varices: Absent Ascites: Absent Technical limitations due to patient motion. Normal direction of  flow of the portal veins toward the liver. Normal direction of the hepatic veins away from the liver. Main portal vein measures 1.1 cm. IVC is patent. Spleen measures 10.5 x 3.9 x 13.0 cm for a calculated splenic volume of 275 mL. Normal appearance of the spleen. IMPRESSION: Normal liver duplex examination. Portal veins are patent with normal direction of flow. Electronically Signed   By: Richarda Overlie M.D.   On: 04/07/2017 12:27    Scheduled Meds: . diazepam  5 mg Oral TID  . gabapentin  100 mg Oral QID  . multivitamin with minerals  1 tablet Oral Daily  . nicotine  21 mg Transdermal Daily  . [START ON 04/11/2017] thiamine  100 mg Oral Daily   Continuous Infusions: . sodium chloride 125 mL/hr at 04/06/17 0759  . dexmedetomidine (PRECEDEX) IV infusion 0.5 mcg/kg/hr (04/07/17 1626)  . thiamine injection Stopped (04/07/17 1654)   PRN Meds: hydrOXYzine, loperamide, ondansetron **OR**  ondansetron (ZOFRAN) IV  Time spent: 35 minutes  Author: Lynden Oxford, MD [facility] Pager: [phone] 04/07/2017 5:16 PM  If 7PM-7AM, please contact night-coverage at [email or web address], password [facility]

## 2017-04-08 LAB — HEPATITIS PANEL, ACUTE
HCV Ab: <0.1 s/co ratio (ref 0.0–0.9)
Hep A IgM: NEGATIVE
Hep B C IgM: NEGATIVE
Hepatitis B Surface Ag: NEGATIVE

## 2017-04-08 LAB — CBC
HCT: 32.9 % — ABNORMAL LOW (ref 39.0–52.0)
HEMOGLOBIN: 10.1 g/dL — AB (ref 13.0–17.0)
MCH: 29.1 pg (ref 26.0–34.0)
MCHC: 30.7 g/dL (ref 30.0–36.0)
MCV: 94.8 fL (ref 78.0–100.0)
PLATELETS: 77 10*3/uL — AB (ref 150–400)
RBC: 3.47 MIL/uL — AB (ref 4.22–5.81)
RDW: 17.5 % — ABNORMAL HIGH (ref 11.5–15.5)
WBC: 3.1 10*3/uL — ABNORMAL LOW (ref 4.0–10.5)

## 2017-04-08 LAB — COMPREHENSIVE METABOLIC PANEL
ALBUMIN: 2.8 g/dL — AB (ref 3.5–5.0)
ALK PHOS: 145 U/L — AB (ref 38–126)
ALT: 64 U/L — AB (ref 17–63)
ANION GAP: 7 (ref 5–15)
AST: 220 U/L — AB (ref 15–41)
BILIRUBIN TOTAL: 1.6 mg/dL — AB (ref 0.3–1.2)
BUN: 5 mg/dL — AB (ref 6–20)
CALCIUM: 8.5 mg/dL — AB (ref 8.9–10.3)
CO2: 22 mmol/L (ref 22–32)
Chloride: 110 mmol/L (ref 101–111)
Creatinine, Ser: 0.63 mg/dL (ref 0.61–1.24)
GFR calc Af Amer: >60 mL/min (ref >60)
GFR calc non Af Amer: >60 mL/min (ref >60)
GLUCOSE: 111 mg/dL — AB (ref 65–99)
Potassium: 3.6 mmol/L (ref 3.5–5.1)
SODIUM: 139 mmol/L (ref 135–145)
TOTAL PROTEIN: 5.9 g/dL — AB (ref 6.5–8.1)

## 2017-04-08 LAB — MAGNESIUM: Magnesium: 1.9 mg/dL (ref 1.7–2.4)

## 2017-04-08 MED ORDER — THIAMINE HCL 100 MG/ML IJ SOLN: 100.0000 mg | Freq: Every day | INTRAMUSCULAR | Status: DC

## 2017-04-08 MED ORDER — FOLIC ACID 1 MG PO TABS
1.0000 mg | ORAL_TABLET | Freq: Every day | ORAL | Status: DC
  Administered 2017-04-08 – 2017-04-09 (×2): 1 mg via ORAL
  Filled 2017-04-08 (×2): qty 1

## 2017-04-08 MED ORDER — LORAZEPAM 1 MG PO TABS
1.0000 mg | ORAL_TABLET | Freq: Four times a day (QID) | ORAL | Status: DC | PRN
  Administered 2017-04-09: 1 mg via ORAL
  Filled 2017-04-08: qty 1

## 2017-04-08 MED ORDER — VITAMIN B-1 100 MG PO TABS: 100.0000 mg | ORAL_TABLET | Freq: Every day | ORAL | Status: DC

## 2017-04-08 MED ORDER — LORAZEPAM 2 MG/ML IJ SOLN
0.0000 mg | Freq: Four times a day (QID) | INTRAMUSCULAR | Status: DC
  Filled 2017-04-08 (×2): qty 1

## 2017-04-08 MED ORDER — LORAZEPAM 2 MG/ML IJ SOLN: 0.0000 mg | Freq: Two times a day (BID) | INTRAMUSCULAR | Status: DC

## 2017-04-08 MED ORDER — LORAZEPAM 2 MG/ML IJ SOLN
1.0000 mg | Freq: Four times a day (QID) | INTRAMUSCULAR | Status: DC | PRN
  Administered 2017-04-09 (×2): 1 mg via INTRAVENOUS
  Filled 2017-04-08: qty 1

## 2017-04-08 MED ORDER — ESCITALOPRAM OXALATE 10 MG PO TABS
10.0000 mg | ORAL_TABLET | Freq: Every day | ORAL | Status: DC
  Administered 2017-04-09: 10 mg via ORAL
  Filled 2017-04-08: qty 1

## 2017-04-08 MED ORDER — ADULT MULTIVITAMIN W/MINERALS CH: 1.0000 | ORAL_TABLET | Freq: Every day | ORAL | Status: DC

## 2017-04-08 NOTE — Progress Notes (Signed)
PULMONARY / CRITICAL CARE MEDICINE   Name: Tony Jennings MRN: [id number] DOB: 1981-06-20    ADMISSION DATE:  04/05/2017   CONSULTATION DATE: 04/07/2017  REFERRING MD:  Allena Katz  CHIEF COMPLAINT:  Delirium Tremens  HISTORY OF PRESENT ILLNESS:  36 y.o.  male with known history of chronic alcohol abuse as well as prior pancreatitis. Patient also has known history of depression and seizures. Recently the patient lost his job. Reportedly he has been sober for the last year but then started drinking again. He also stopped going to his Alcoholics Anonymous meeting. Patient was brought to the hospital on 9/29 with concern for alcohol withdrawal. Patient transitioned to stepdown unit with intermittent periods of impulsive behavior and combative behavior towards staff. Patient has been administered IV bolus Ativan/benzodiazepines as well as Haldol. Patient has also had periods of both visual and auditory hallucinations. Patient has been evaluated by psychiatry as well. At the time of my exam he denies any abdominal pain or nausea. He denies any chest pain. He denies any difficulty breathing. Patient attempts to joke during exam answering questions with "no" rather than direct answers.   SUBJECTIVE:  Patient reports dry mouth this morning. Denies any other complaints. No chest pain or pressure. No dyspnea or cough.  REVIEW OF SYSTEMS:  No abdominal pain or nausea. No subjective fever or chills.  VITAL SIGNS: BP (!) 159/106   Pulse (!) 126   Temp 97.6 F (36.4 C) (Axillary)   Resp (!) 21   Ht  (1.753 m)   Wt 145 lb 8.1 oz (66 kg)   SpO2 98%   BMI 21.49 kg/m   HEMODYNAMICS:    VENTILATOR SETTINGS:    INTAKE / OUTPUT: I/O last 3 completed shifts: In: 2625.8 [I.V.:2625.8] Out: 2750 [Urine:2750]  PHYSICAL EXAMINATION: General:  Awake. Alert. No acute distress.   Integument:  Warm. Dry. No rash appreciated.. Extremities:  No cyanosis or clubbing.  HEENT:  Tacky mucus membranes. No  scleral injection or icterus.  Cardiovascular:  Regular rate. No edema. No appreciable JVD.  Pulmonary:  Good aeration & clear to auscultation bilaterally. Normal work of breathing. Abdomen: Soft. Normal bowel sounds. Nondistended.  Neurological:  Alert and oriented 4. Cranial nerves grossly nonfocal. Mild tremor.  LABS:  BMET  Recent Labs Lab 04/06/17 0353 04/07/17 0335 04/08/17 0324  NA 134* 139 139  K 3.5 3.4* 3.6  CL 102 109 110  CO2 21* 21* 22  BUN <5* <5* 5*  CREATININE 0.73 0.66 0.63  GLUCOSE 111* 105* 111*    Electrolytes  Recent Labs Lab 04/05/17 2158 04/06/17 0353 04/07/17 0335 04/08/17 0324  CALCIUM 9.5 8.5* 8.2* 8.5*  MG 1.7  --  1.8 1.9  PHOS 2.3*  --   --   --     CBC  Recent Labs Lab 04/06/17 0353 04/07/17 0335 04/08/17 0324  WBC 3.9* 4.0 3.1*  HGB 9.7* 8.7* 10.1*  HCT 30.4* 28.3* 32.9*  PLT 48* 55* 77*    Coag's  Recent Labs Lab 04/07/17 0335  INR 1.00    Sepsis Markers No results for input(s): LATICACIDVEN, PROCALCITON, O2SATVEN in the last 168 hours.  ABG No results for input(s): PHART, PCO2ART, PO2ART in the last 168 hours.  Liver Enzymes  Recent Labs Lab 04/06/17 0353 04/07/17 0335 04/08/17 0324  AST 352* 251* 220*  ALT 86* 69* 64*  ALKPHOS 163* 128* 145*  BILITOT 2.1* 1.8* 1.6*  ALBUMIN 3.2* 2.8* 2.8*    Cardiac Enzymes No results  for input(s): TROPONINI, PROBNP in the last 168 hours.  Glucose No results for input(s): GLUCAP in the last 168 hours.  Imaging US Liver Doppler  Result Date: 04/07/2017 CLINICAL DATA:  History of alcohol abuse and delirium tremens. Thrombocytopenia. EXAM: DUPLEX ULTRASOUND OF LIVER TECHNIQUE: Color and duplex Doppler ultrasound was performed to evaluate the hepatic in-flow and out-flow vessels. COMPARISON:  None. FINDINGS: Portal Vein Velocities Main:  22 cm/sec Right:  11 cm/sec Left:  19 cm/sec Hepatic Vein Velocities Right:  32 cm/sec Middle:  24 cm/sec Left:  20 cm/sec Hepatic  Artery Velocity:  82 cm/sec Splenic Vein Velocity:  35 cm/sec Varices: Absent Ascites: Absent Technical limitations due to patient motion. Normal direction of flow of the portal veins toward the liver. Normal direction of the hepatic veins away from the liver. Main portal vein measures 1.1 cm. IVC is patent. Spleen measures 10.5 x 3.9 x 13.0 cm for a calculated splenic volume of 275 mL. Normal appearance of the spleen. IMPRESSION: Normal liver duplex examination. Portal veins are patent with normal direction of flow. Electronically Signed   By: Richarda Overlie M.D.   On: 04/07/2017 12:27     IMAGING/STUDIES: CT HEAD W/O 9/29:  Unremarkable noncontrast head CT. MRI BRAIN W/O 9/29:  Normal brain MRI. No acute intracranial abnormality identified. US LIVER DOPPLAR 10/1:  Normal liver duplex examination. Portal veins are patent with normal direction of flow.  MICROBIOLOGY: MRSA PCR 9/29:  Negative   ANTIBIOTICS: None  SIGNIFICANT EVENTS: 09/29 - Admit 10/01 - Started Precedex drip & Scheduled Valium  po TID  LINES/TUBES: PIV  ASSESSMENT / PLAN:  36 y.o. male with acute encephalopathy and delirium tremens second alternate to alcohol withdrawal. Clinically patient improving on high-dose IV thiamine and scheduled oral benzodiazepine. Nurse is able to gradually wean Precedex infusion. Currently it is almost off.  1. Acute encephalopathy: Improving. Continuing high-dose IV thiamine with stop date in place. Patient will resume oral thiamine on 10/4. Continuing treatment of alcohol withdrawal as follows. 2. Alcohol withdrawal/delirium tremens: Improving. Weaning Precedex infusion. Continuing Valium 5 mg by mouth 3 times a day. Continuing daily multivitamin and Neurontin 100 mg by mouth 4 times a day.  Remainder of care as per primary service and other consultants.  Donna Christen Jamison Neighbor, M.D. [facility] Pager:  [phone] After 7pm or if no response, call  [phone] 04/08/2017, 7:57 AM

## 2017-04-08 NOTE — Clinical Social Work Psych Note (Signed)
Clinical Social Worker Psych Service Line Progress Note  Clinical Social Worker: Cordella Register Date/Time: 04/08/2017, 4:12 PM   Review of Patient  Overall Medical Condition:  Patient remains in the ICU, calm, cooperative and able to participate in follow up assessment.  Patient has been weaned off his precedex drip and able to give detailed history regarding events that admitted him to the hospital.   Patient remains on IV medications and is observed with tremors secondary from withdrawal.  Participation Level:  Active Participation Quality: Appropriate, Attentive, Other - See Comments (Engaged) Other Participation Quality:  Patient answers questions appropriately and is engaged with information.  His mother was in the room prior to assessment.  He asked her to leave and assessment was completed with LCSW alone.   He is processing his options regarding next steps rehab vs home.  He is leaning more to outpatient CDIOP vs inpatient substance abuse.  Affect: Anxious, Flat Cognitive: Alert, Appropriate, Oriented Reaction to Medications/Concerns:  None at this time.   Modes of Intervention: Solution-focused, Exploration, Support   Summary of Progress/Plan at Discharge  Summary of Progress/Plan at Discharge: Tony Jennings engaged in assessment with LCSW to discuss his options at discharge.  He processed his triggers related to relapse on alcohol (loss of multiple jobs, stress from work, loss of his dog of 17 years, and his long term girlfriend of 7 years going away/ him moving in with his parents.  He reports he has a long history of abusing alcohol with a high tolerance.  Reports he would drink from sun up to sun down every day for almost the last year. Reports he was functional though as he went to work and was able to complete his day to day activities with no barriers.  He reports he would secretly drink to "maintain" and not go through withdrawals.  Tony Jennings reports he can return home with his  parents at discharge and eventually will return with his girlfriend Tony Jennings.  He reports he has no children and does not want kids, but wants to continue the relationship with Tony Jennings as he is very happy with her.  He reports he also wants and needs to get back to work.  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  He is educated regarding his options for rehab as he has active [employer]. During assessment Tony Jennings showed up and he asked if LCSW could come back tomorrow morning to finalize plans (outpatient vs inpatient) and look at other options.  LCSW will honor his wishes and follow up in the morning.  No other needs at this time.  Patient left with visitor.   Plan:  TBD, will follow up on Wednesday.  [name], MSW Clinical Social Work: Optician, dispensing Coverage for :  [phone]

## 2017-04-08 NOTE — Progress Notes (Signed)
PCCM Attending Re-Rounding Note:  Patient off Precedex drip as of 10:30 this morning. Recommend continuing current plan of care. PCCM will be available as needed.  Donna Christen Jamison Neighbor, M.D. [facility] Pager:  [phone] After 7pm or if no response, call [phone] 2:08 PM 04/08/17

## 2017-04-08 NOTE — Progress Notes (Signed)
[facility] Progress Note  Patient: Tony Jennings:[id number]   PCP: Donato Schultz, DO DOB: [birth year]   DOA: 04/05/2017   DOS: 04/08/2017   Date of Service: the patient was seen and examined on 04/08/2017  Subjective: breathing better, significantly calmed. No nausea no vomiting but tolerating oral diet. No diarrhea no constipation.  Brief hospital course: Pt. with PMH of alcohol abuse, anemia, anxiety, depression, seizure; admitted on 04/05/2017, presented with complaint of alcohol withdrawal, was found to have delirium tremens.patient was started on IV Ativan per CIWA protocol. His agitation was not controlled and therefore he was switched to Precedex. Significantly better since morning of 04/08/2017 despite stopping the Precedex Currently further plan is continue monitor overnight  Assessment and Plan:  Delirium tremens (HCC) Improving. No further confusion. Continue IV fluids. Continue CIWA protocol with lorazepam IV. Discussed with CCM, appreciate input, continue by mouth Valium. Patient was given IV Precedex between 10/1 and 04/08/2017.turned off since 04/08/2017 10:30 AM. Lenise Herald continues to remain stable. We will transfer totelemetry and monitored overnight. Continue thiamine and MVI supplementation.  Active Problems:   Generalized anxiety disorder Currently on benzodiazepine detox protocol. He recently stopped his antidepressant.    Depression He recently stopped Lexapro due to side effects. Denies suicidal or homicidal ideations. Psychiatric consulted. Per them, "Patient meets criteria for capacity to make his own medical decisions and living arrangement as per my evaluation today; May restart Lexapro 10 mg daily for depression when medically cleared". i would restart Lexapro.    Anemia Secondary to alcohol consumption. Normal iron level, normal B-12. Monitor hematocrit and hemoglobin.    Thrombocytopenia (HCC) Secondary to alcohol  ingestion. Monitor platelet level.    Hyponatremia Continue IV fluids. Daily BMP.    Hyperglycemia Monitor blood glucose. Consider further workup if hyperglycemia is persistent.    Seizures (HCC) Resume Neurontin 100 mg by mouth 3 times a day    Mild Alcohol-induced hepatitis. Patient has elevated LFTs with AST and ALT ratio consistent with alcohol hepatitis. Based on bilirubin level as well as INR maddrey df score is 8. Currently conservative management.  Diet: Regular diet, aspiration precaution DVT Prophylaxis: subcutaneous Heparin  Advance goals of care discussion: full code  Family Communication: family was present at bedside, at the time of interview. Patient provided permission. She had questions regarding what to do after the discharge. I mentioned that I will consult social worker for alcohol rehabilitation.  Disposition:  Discharge to home.  Consultants: Psychiatry, PCCM  Procedures: None  Antibiotics: Anti-infectives    None       Objective: Physical Exam: Vitals:   04/08/17 1121 04/08/17 1130 04/08/17 1230 04/08/17 1630  BP:  (!) 130/92 (!) 123/93 (!) 152/104  Pulse:      Resp:  Temp: 98 F (36.7 C)     TempSrc: Axillary     SpO2:  100% 98% 97%  Weight:      Height:        Intake/Output Summary (Last 24 hours) at 04/08/17 1738 Last data filed at 04/08/17 0818  Gross per 24 hour  Intake                0 ml  Output             1850 ml  Net            -1850 ml   Filed Weights   04/05/17 0843 04/05/17 2109  Weight: 65.8 kg (145 lb) 66  kg (145 lb 8.1 oz)   General: Alert, Awake and Oriented to Place and Person. Appear in mild distress, affect irritable Eyes: PERRL, Conjunctiva normal ENT: Oral Mucosa clear dry. Neck: no JVD, no Abnormal Mass Or lumps Cardiovascular: S1 and S2 Present, no Murmur, Peripheral Pulses Present Respiratory: normal respiratory effort, Bilateral Air entry equal and Decreased, no use of accessory muscle,  Clear to Auscultation, no Crackles, no wheezes Abdomen: Bowel Sound poresent, Soft and no tenderness, no hernia Skin: no redness, no Rash, no induration Extremities: no Pedal edema, no calf tenderness Neurologic: Grossly no focal neuro deficit. Bilaterally Equal motor strength  Data Reviewed: CBC:  Recent Labs Lab 04/05/17 0845 04/06/17 0353 04/07/17 0335 04/08/17 0324  WBC 5.6 3.9* 4.0 3.1*  NEUTROABS 4.5  --  2.4  --   HGB 10.9* 9.7* 8.7* 10.1*  HCT 34.5* 30.4* 28.3* 32.9*  MCV 90.3 91.8 93.1 94.8  PLT 44* 48* 55* 77*   Basic Metabolic Panel:  Recent Labs Lab 04/05/17 0845 04/05/17 2158 04/06/17 0353 04/07/17 0335 04/08/17 0324  NA 133* 132* 134* 139 139  K 3.7 3.0* 3.5 3.4* 3.6  CL 97* 98* 102 109 110  CO2 19* 24 21* 21* 22  GLUCOSE 175* 121* 111* 105* 111*  BUN 7 6 <5* <5* 5*  CREATININE 0.95 0.78 0.73 0.66 0.63  CALCIUM 9.0 9.5 8.5* 8.2* 8.5*  MG  --  1.7  --  1.8 1.9  PHOS  --  2.3*  --   --   --     Liver Function Tests:  Recent Labs Lab 04/05/17 2158 04/06/17 0353 04/07/17 0335 04/08/17 0324  AST 369* 352* 251* 220*  ALT 85* 86* 69* 64*  ALKPHOS 162* 163* 128* 145*  BILITOT 2.5* 2.1* 1.8* 1.6*  PROT 6.3* 6.2* 5.6* 5.9*  ALBUMIN 3.2* 3.2* 2.8* 2.8*    Recent Labs Lab 04/05/17 2158  LIPASE 76*   No results for input(s): AMMONIA in the last 168 hours. Coagulation Profile:  Recent Labs Lab 04/07/17 0335  INR 1.00   Cardiac Enzymes: No results for input(s): CKTOTAL, CKMB, CKMBINDEX, TROPONINI in the last 168 hours. BNP (last 3 results) No results for input(s): PROBNP in the last 8760 hours. CBG: No results for input(s): GLUCAP in the last 168 hours. Studies: No results found.  Scheduled Meds: . diazepam  5 mg Oral TID  . gabapentin  100 mg Oral QID  . multivitamin with minerals  1 tablet Oral Daily  . nicotine  21 mg Transdermal Daily  . [START ON 04/11/2017] thiamine  100 mg Oral Daily   Continuous Infusions: . sodium chloride  125 mL/hr at 04/06/17 0759  . dexmedetomidine (PRECEDEX) IV infusion Stopped (04/08/17 1031)  . thiamine injection Stopped (04/08/17 1503)   PRN Meds: hydrOXYzine, loperamide, ondansetron **OR** ondansetron (ZOFRAN) IV  Time spent: 35 minutes  Author: Lynden Oxford, MD [facility] Pager: [phone] 04/08/2017 5:38 PM  If 7PM-7AM, please contact night-coverage at [email or web address], password [facility]

## 2017-04-09 DIAGNOSIS — R945 Abnormal results of liver function studies: Secondary | ICD-10-CM

## 2017-04-09 DIAGNOSIS — E871 Hypo-osmolality and hyponatremia: Secondary | ICD-10-CM

## 2017-04-09 DIAGNOSIS — F329 Major depressive disorder, single episode, unspecified: Secondary | ICD-10-CM

## 2017-04-09 DIAGNOSIS — F411 Generalized anxiety disorder: Secondary | ICD-10-CM

## 2017-04-09 LAB — CBC WITH DIFFERENTIAL/PLATELET
BASOS PCT: 1 %
Basophils Absolute: 0.1 10*3/uL (ref 0.0–0.1)
EOS ABS: 0.2 10*3/uL (ref 0.0–0.7)
Eosinophils Relative: 5 %
HCT: 30.5 % — ABNORMAL LOW (ref 39.0–52.0)
HEMOGLOBIN: 9.3 g/dL — AB (ref 13.0–17.0)
Lymphocytes Relative: 26 %
Lymphs Abs: 1.2 10*3/uL (ref 0.7–4.0)
MCH: 28.6 pg (ref 26.0–34.0)
MCHC: 30.5 g/dL (ref 30.0–36.0)
MCV: 93.8 fL (ref 78.0–100.0)
MONO ABS: 0.6 10*3/uL (ref 0.1–1.0)
MONOS PCT: 14 %
NEUTROS ABS: 2.5 10*3/uL (ref 1.7–7.7)
Neutrophils Relative %: 55 %
Platelets: 117 10*3/uL — ABNORMAL LOW (ref 150–400)
RBC: 3.25 MIL/uL — ABNORMAL LOW (ref 4.22–5.81)
RDW: 17.7 % — AB (ref 11.5–15.5)
WBC: 4.5 10*3/uL (ref 4.0–10.5)

## 2017-04-09 LAB — COMPREHENSIVE METABOLIC PANEL
ALBUMIN: 2.9 g/dL — AB (ref 3.5–5.0)
ALK PHOS: 140 U/L — AB (ref 38–126)
ALT: 58 U/L (ref 17–63)
ANION GAP: 6 (ref 5–15)
AST: 202 U/L — ABNORMAL HIGH (ref 15–41)
BILIRUBIN TOTAL: 1.4 mg/dL — AB (ref 0.3–1.2)
BUN: 5 mg/dL — ABNORMAL LOW (ref 6–20)
CALCIUM: 8.4 mg/dL — AB (ref 8.9–10.3)
CO2: 25 mmol/L (ref 22–32)
Chloride: 107 mmol/L (ref 101–111)
Creatinine, Ser: 0.62 mg/dL (ref 0.61–1.24)
GFR calc non Af Amer: >60 mL/min (ref >60)
GLUCOSE: 103 mg/dL — AB (ref 65–99)
POTASSIUM: 3.2 mmol/L — AB (ref 3.5–5.1)
SODIUM: 138 mmol/L (ref 135–145)
TOTAL PROTEIN: 5.9 g/dL — AB (ref 6.5–8.1)

## 2017-04-09 LAB — MAGNESIUM: Magnesium: 1.8 mg/dL (ref 1.7–2.4)

## 2017-04-09 MED ORDER — NICOTINE 21 MG/24HR TD PT24: 21.0000 mg | MEDICATED_PATCH | Freq: Every day | TRANSDERMAL | 1 refills | Status: DC

## 2017-04-09 MED ORDER — POTASSIUM CHLORIDE CRYS ER 20 MEQ PO TBCR
40.0000 meq | EXTENDED_RELEASE_TABLET | ORAL | Status: AC
  Administered 2017-04-09 (×2): 40 meq via ORAL
  Filled 2017-04-09 (×2): qty 2

## 2017-04-09 MED ORDER — VITAMIN B-1 100 MG PO TABS: 100.0000 mg | ORAL_TABLET | Freq: Every day | ORAL | 1 refills | Status: DC

## 2017-04-09 MED ORDER — FOLIC ACID 1 MG PO TABS: 1.0000 mg | ORAL_TABLET | Freq: Every day | ORAL | 1 refills | Status: DC

## 2017-04-09 MED ORDER — ESCITALOPRAM OXALATE 10 MG PO TABS: 10.0000 mg | ORAL_TABLET | Freq: Every day | ORAL | 1 refills | Status: DC

## 2017-04-09 MED ORDER — DIAZEPAM 5 MG PO TABS: 5.0000 mg | ORAL_TABLET | Freq: Three times a day (TID) | ORAL | 0 refills | Status: DC

## 2017-04-09 NOTE — Discharge Summary (Signed)
Physician Discharge Summary  Tony Jennings ZOX:[id number] DOB: 1981-04-28 DOA: 04/05/2017  PCP: Donato Schultz, DO  Admit date: 04/05/2017 Discharge date: 04/09/2017  Time spent: 35 minutes  Recommendations for Outpatient Follow-up:  Repeat BMET to follow electrolytes and renal function  Continue assisting patient as needed with alcohol cessation and abstinence  Repeat CBC to follow Hgb and platelets count Follow CBG's and check A1C; patient with hyperglycemia during hospitalization.  Repeat LFT's to assess hepatic enzymes trend after ETOH abstinence   Discharge Diagnoses:  Principal Problem:   Delirium tremens (HCC) Active Problems:   Generalized anxiety disorder   Seizures (HCC)   Depression   Anemia   Thrombocytopenia (HCC)   Hyponatremia   Hyperglycemia   Discharge Condition: stable and improved. Patient discharge home with instructions to follow up with PCP in 10 days. Family was also looking to take to inpatient detox facility. SW provided info/details of outpatient resources and facilities protocol.   Diet recommendation: modified carbohydrates diet   Filed Weights   04/05/17 0843 04/05/17 2109  Weight: 65.8 kg (145 lb) 66 kg (145 lb 8.1 oz)    History of present illness:  As per H&P written by Dr. Robb Matar on 04/05/17 36 y.o. 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  Per patient, he had been sober for about a year after spending almost a month in rehabilitation. However, he states that he lost his job recently, felt anxious, depressed and an urge to start drinking again. He stopped going to [employer] and began socializing with friends that drink alcohol. He has been drinking about a pint of alcohol  every day until 2 days ago. Today in the morning he was taken by his parents to the emergency department at [facility] for evaluation. He was very confused and underwent CT and MRI of the head, which were normal. He denies homicidal or suicidal ideations. He denies headache, chest pain, dizziness, palpitations, diaphoresis, dyspnea, PND, orthopnea or pitting edema of the lower extremities. Denies abdominal pain, nausea, emesis, diarrhea, constipation, melena or hematochezia. He denies GU symptoms. No polyuria, polydipsia or blurred vision.   Hospital Course:  Alcohol withdrawal with Delirium tremens (HCC) -improved/resolved -patient stable and AAOX3 at discharge -discharge on Valim; will benefit of tapering  -CIWA score 6 at discharge -Patient required Precedex between 04/07/17 and 04/08/17 due to severe agitation and delirium. -condition stabilizes and patient safely transitioned to valium. -Continue thiamine and folic acid supplementation. -extensive cessation counseling provided  Generalized anxiety disorder -will discharge on Valim  -will benefit of outpatient follow up with psychiatry   Depression -Denies suicidal or homicidal ideations. -Psychiatric consulted :"Patient meets criteria for capacity to make his own medical decisions and living arrangement as per my evaluation today; May restart Lexapro 10 mg daily for depression when medically cleared". -Discharge on lexapro   Anemia -Secondary to alcohol consumption. -Normal iron level, normal B-12. -Monitor hematocrit and hemoglobin at follow visit -no signs of active bleeding   Thrombocytopenia (HCC) -Secondary to alcohol ingestion. -Monitor platelet level at follow visit  Hyponatremia -stable and WNL at discharge -will recommend BMET to follow electrolytes    Hyperglycemia -repeat CBG's and check A1C -no prior hx of diabetes  Seizures (HCC) -continue Neurontin 100 mg by mouth 3 times a day -no seizure  appreciated  Mild Alcohol-induced hepatitis. -Patient has elevated LFTs with AST and ALT ratio consistent with alcohol hepatitis. -Based on bilirubin level as well as INR maddrey df score is 8. -continue conservative management and encouraged to follow complete alcohol abstinence  -repeat LFT's as an outpatient    Procedures:  See below for x-ray reports   Consultations:  PCCM  Discharge Exam: Vitals:   04/09/17 0011 04/09/17 0627  BP: (!) 129/99 (!) 133/93  Pulse: (!) 120 96  Resp: 20 20  Temp: 99.8 F (37.7 C) 100.2 F (37.9 C)  SpO2: 100% 100%   General: Alert, Awake and Oriented to Place, time and Person. No acute distress and asking to be discharge. Eyes: PERRL, Conjunctiva normal ENT: Oral Mucosa clear dry. Neck: no JVD, no Abnormal Mass Or lumps Cardiovascular: S1 and S2 Present, no Murmur, Peripheral Pulses Present Respiratory: normal respiratory effort, good air movement, no use of accessory muscle, Clear to Auscultation, no Crackles, no wheezes Abdomen: Bowel Sound present, Soft and no tenderness, no hernia Skin: no redness, no Rash, no induration Extremities: no Pedal edema, no calf tenderness Neurologic: Grossly no focal neuro deficit. Bilaterally Equal motor strength; CN intact   Discharge Instructions   Discharge Instructions    Discharge instructions    Complete by:  As directed    Stop alcohol consumption  Maintained adequate hydration  Take medications as prescribed Arrange follow up with PCP in 10 days Stop smoking     Current Discharge Medication List    START taking these medications   Details  diazepam (VALIUM) 5 MG tablet Take 1 tablet (5 mg total) by mouth 3 (three) times daily. Qty: 45 tablet, Refills: 0    folic acid (FOLVITE) 1 MG tablet Take 1 tablet (1 mg total) by mouth daily. Qty: 30 tablet, Refills: 1    nicotine (NICODERM CQ - DOSED IN MG/24 HOURS) 21 mg/24hr patch Place 1 patch (21 mg total) onto the skin daily. Qty:  28 patch, Refills: 1      CONTINUE these medications which have CHANGED   Details  escitalopram (LEXAPRO) 10 MG tablet Take 1 tablet (10 mg total) by mouth at bedtime. Qty: 30 tablet, Refills: 1    thiamine (VITAMIN B-1) 100 MG tablet Take 1 tablet (100 mg total) by mouth daily. Qty: 30 tablet, Refills: 1      CONTINUE these medications which have NOT CHANGED   Details  gabapentin (NEURONTIN) 100 MG capsule Take 1 capsule (100 mg total) by mouth 4 (four) times daily. Qty: 120 capsule, Refills: 0    Multiple Vitamin (MULTIVITAMIN WITH MINERALS) TABS tablet Take 1 tablet by mouth daily.       Allergies  Allergen Reactions  . Citalopram Other (See Comments)    Tremors   Follow-up Information    [facility] Follow up on 04/11/2017.   Specialty:  Behavioral Health Why:  Call [name].  Number on follow up paperwork.  She can see you on Friday at [facility] information: [address] [place] [address] [id number] mc [place] [address] [phone]          The results of significant diagnostics from this hospitalization (including imaging, microbiology, ancillary and laboratory) are listed below for reference.    Significant Diagnostic Studies: Ct Head Wo Contrast  Result Date: 04/05/2017 CLINICAL DATA:  36 year old male with acute altered mental status, dizziness and ataxia. EXAM: CT HEAD WITHOUT CONTRAST TECHNIQUE: Contiguous axial images were obtained from the base of the  skull through the vertex without intravenous contrast. COMPARISON:  None. FINDINGS: Brain: No evidence of infarction, hemorrhage, hydrocephalus, extra-axial collection or mass lesion/mass effect. Vascular: No hyperdense vessel or unexpected calcification. Skull: Normal. Negative for fracture or focal lesion. Sinuses/Orbits: No acute finding. Other: None. IMPRESSION: Unremarkable noncontrast head CT. Electronically Signed   By: Harmon Pier M.D.   On: 04/05/2017  09:35   Mr Brain Wo Contrast  Result Date: 04/05/2017 CLINICAL DATA:  Initial evaluation for acute ataxia, suspect stroke. EXAM: MRI HEAD WITHOUT CONTRAST TECHNIQUE: Multiplanar, multiecho pulse sequences of the brain and surrounding structures were obtained without intravenous contrast. COMPARISON:  Prior CT from earlier the same day. FINDINGS: Brain: Study degraded by motion artifact. Cerebral volume within normal limits. No obvious focal parenchymal signal abnormality. No abnormal foci of restricted diffusion to suggest acute or subacute ischemia. Gray-white matter differentiation maintained. No encephalomalacia to suggest chronic infarction. No evidence for acute or chronic intracranial hemorrhage. No mass lesion, midline shift or mass effect. No hydrocephalus. No extra-axial fluid collection. Major dural sinuses grossly patent. Pituitary gland suprasellar region normal. Midline structures intact and normal. Vascular: Major intracranial vascular flow voids grossly maintained. Skull and upper cervical spine: Craniocervical junction normal. Upper cervical spine within normal limits. Bone marrow signal intensity normal. No scalp soft tissue abnormality. Sinuses/Orbits: Globes and orbital soft tissues grossly within normal limits. Paranasal sinuses are clear. No mastoid effusion. Other: None. IMPRESSION: Normal brain MRI.  No acute intracranial abnormality identified. Electronically Signed   By: Rise Mu M.D.   On: 04/05/2017 15:12   US Liver Doppler  Result Date: 04/07/2017 CLINICAL DATA:  History of alcohol abuse and delirium tremens. Thrombocytopenia. EXAM: DUPLEX ULTRASOUND OF LIVER TECHNIQUE: Color and duplex Doppler ultrasound was performed to evaluate the hepatic in-flow and out-flow vessels. COMPARISON:  None. FINDINGS: Portal Vein Velocities Main:  22 cm/sec Right:  11 cm/sec Left:  19 cm/sec Hepatic Vein Velocities Right:  32 cm/sec Middle:  24 cm/sec Left:  20 cm/sec Hepatic Artery  Velocity:  82 cm/sec Splenic Vein Velocity:  35 cm/sec Varices: Absent Ascites: Absent Technical limitations due to patient motion. Normal direction of flow of the portal veins toward the liver. Normal direction of the hepatic veins away from the liver. Main portal vein measures 1.1 cm. IVC is patent. Spleen measures 10.5 x 3.9 x 13.0 cm for a calculated splenic volume of 275 mL. Normal appearance of the spleen. IMPRESSION: Normal liver duplex examination. Portal veins are patent with normal direction of flow. Electronically Signed   By: Richarda Overlie M.D.   On: 04/07/2017 12:27    Microbiology: Recent Results (from the past 240 hour(s))  MRSA PCR Screening     Status: None   Collection Time: 04/05/17  9:08 PM  Result Value Ref Range Status   MRSA by PCR NEGATIVE NEGATIVE Final    Comment:        The GeneXpert MRSA Assay (FDA approved for NASAL specimens only), is one component of a comprehensive MRSA colonization surveillance program. It is not intended to diagnose MRSA infection nor to guide or monitor treatment for MRSA infections.      Labs: Basic Metabolic Panel:  Recent Labs Lab 04/05/17 2158 04/06/17 0353 04/07/17 0335 04/08/17 0324 04/09/17 0453  NA 132* 134* 139 139 138  K 3.0* 3.5 3.4* 3.6 3.2*  CL 98* 102 109 110 107  CO2 24 21* 21* 22 25  GLUCOSE 121* 111* 105* 111* 103*  BUN 6 <5* <5* 5* 5*  CREATININE 0.78 0.73 0.66 0.63 0.62  CALCIUM 9.5 8.5* 8.2* 8.5* 8.4*  MG 1.7  --  1.8 1.9 1.8  PHOS 2.3*  --   --   --   --    Liver Function Tests:  Recent Labs Lab 04/05/17 2158 04/06/17 0353 04/07/17 0335 04/08/17 0324 04/09/17 0453  AST 369* 352* 251* 220* 202*  ALT 85* 86* 69* 64* 58  ALKPHOS 162* 163* 128* 145* 140*  BILITOT 2.5* 2.1* 1.8* 1.6* 1.4*  PROT 6.3* 6.2* 5.6* 5.9* 5.9*  ALBUMIN 3.2* 3.2* 2.8* 2.8* 2.9*    Recent Labs Lab 04/05/17 2158  LIPASE 76*   CBC:  Recent Labs Lab 04/05/17 0845 04/06/17 0353 04/07/17 0335 04/08/17 0324  04/09/17 0453  WBC 5.6 3.9* 4.0 3.1* 4.5  NEUTROABS 4.5  --  2.4  --  2.5  HGB 10.9* 9.7* 8.7* 10.1* 9.3*  HCT 34.5* 30.4* 28.3* 32.9* 30.5*  MCV 90.3 91.8 93.1 94.8 93.8  PLT 44* 48* 55* 77* 117*    Signed:  Vassie Loll MD.  [facility] 04/09/2017, 2:23 PM

## 2017-04-09 NOTE — Evaluation (Signed)
Physical Therapy Evaluation Patient Details Name: Tony Jennings MRN: [id number] DOB: 11-Aug-1980 Today's Date: 04/09/2017   History of Present Illness  Pt is a 36 year old male with PMH of alcohol abuse, anemia, anxiety, depression, seizure; admitted on 04/05/2017, presented with complaint of alcohol withdrawal, was found to have delirium tremens.  Clinical Impression  Pt admitted with above diagnosis. Pt currently with functional limitations due to the deficits listed below (see PT Problem List).  Pt will benefit from skilled PT to increase their independence and safety with mobility to allow discharge to the venue listed below.  Pt assisted with ambulating in hallway and required RW due to shaky extremities.  Answered pt's questions about strength and mobility in regards to his current admission and if he continues alcohol abuse.  Recommended pt use RW upon d/c for safety until his shaky extremities have resolved.     Follow Up Recommendations No PT follow up    Equipment Recommendations  None recommended by PT (pt states he can use his father's RW)    Recommendations for Other Services       Precautions / Restrictions Precautions Precautions: Fall      Mobility  Bed Mobility Overal bed mobility: Needs Assistance Bed Mobility: Supine to Sit     Supine to sit: Supervision     General bed mobility comments: supervision for lines only  Transfers Overall transfer level: Needs assistance Equipment used: Rolling walker (2 wheeled) Transfers: Sit to/from Stand Sit to Stand: Min guard         General transfer comment: min/guard for safety, verbal cues for hand placement  Ambulation/Gait Ambulation/Gait assistance: Min guard Ambulation Distance (Feet): 320 Feet Assistive device: Rolling walker (2 wheeled) Gait Pattern/deviations: Step-through pattern;Decreased stride length     General Gait Details: pt initially very shaky however improved slightly with distance, RW  assisting with steadying pt and no LOB observed with RW  Stairs            Wheelchair Mobility    Modified Rankin (Stroke Patients Only)       Balance Overall balance assessment: Needs assistance         Standing balance support: No upper extremity supported Standing balance-Leahy Scale: Fair                 High Level Balance Comments: utilized RW for ambulation for UE support due to shaky extremities             Pertinent Vitals/Pain Pain Assessment: No/denies pain    Home Living Family/patient expects to be discharged to:: Private residence Living Arrangements: Parent Available Help at Discharge: Family Type of Home: House Home Access: Stairs to enter Entrance Stairs-Rails: Right Entrance Stairs-Number of Steps: 5 Home Layout: One level Home Equipment: Environmental consultant - 2 wheels      Prior Function Level of Independence: Independent               Hand Dominance        Extremity/Trunk Assessment        Lower Extremity Assessment Lower Extremity Assessment: Overall WFL for tasks assessed    Cervical / Trunk Assessment Cervical / Trunk Assessment: Normal  Communication   Communication: No difficulties  Cognition Arousal/Alertness: Awake/alert Behavior During Therapy: WFL for tasks assessed/performed Overall Cognitive Status: Within Functional Limits for tasks assessed  General Comments      Exercises     Assessment/Plan    PT Assessment Patient needs continued PT services  PT Problem List Decreased mobility;Decreased balance;Decreased knowledge of use of DME       PT Treatment Interventions DME instruction;Gait training;Therapeutic exercise;Patient/family education;Functional mobility training;Balance training    PT Goals (Current goals can be found in the Care Plan section)  Acute Rehab PT Goals PT Goal Formulation: With patient Time For Goal Achievement:  04/16/17 Potential to Achieve Goals: Good    Frequency Min 2X/week   Barriers to discharge        Co-evaluation               AM-PAC PT "6 Clicks" Daily Activity  Outcome Measure Difficulty turning over in bed (including adjusting bedclothes, sheets and blankets)?: None Difficulty moving from lying on back to sitting on the side of the bed? : None Difficulty sitting down on and standing up from a chair with arms (e.g., wheelchair, bedside commode, etc,.)?: A Little Help needed moving to and from a bed to chair (including a wheelchair)?: A Little Help needed walking in hospital room?: A Little Help needed climbing 3-5 steps with a railing? : A Little 6 Click Score: 20    End of Session Equipment Utilized During Treatment: Gait belt Activity Tolerance: Patient tolerated treatment well Patient left: in chair;with family/visitor present;Other (comment) (with CSW) Nurse Communication: Mobility status PT Visit Diagnosis: Difficulty in walking, not elsewhere classified (R26.2)    Time: 4098-1191 PT Time Calculation (min) (ACUTE ONLY): 17 min   Charges:   PT Evaluation $PT Eval Low Complexity: 1 Low     PT G Codes:        Zenovia Jarred, PT, DPT 04/09/2017 Pager: [phone]  Maida Sale E 04/09/2017, 10:51 AM

## 2017-04-09 NOTE — Care Management Note (Signed)
Case Management Note  Patient Details  Name: Tony Jennings MRN: [id number] Date of Birth: [birth year]  Subjective/Objective: PCP appt(patient able to call on own to set up) in follow up section of d/c instructions. No further CM needs.                   Action/Plan:d/c home.   Expected Discharge Date:  04/09/17               Expected Discharge Plan:  Home/Self Care  In-House Referral:     Discharge planning Services  CM Consult  Post Acute Care Choice:    Choice offered to:     DME Arranged:    DME Agency:     HH Arranged:    HH Agency:     Status of Service:  Completed, signed off  If discussed at [employer] of Stay Meetings, dates discussed:    Additional Comments:  Lanier Clam, RN 04/09/2017, 2:53 PM

## 2017-04-09 NOTE — Progress Notes (Signed)
Clinical Social Worker Psych Service Line Progress Note  Clinical Social Worker: Tony Jennings Date/Time: 04/09/2017, 10:41 AM   Review of Patient  Overall Medical Condition:  Tony Jennings has moved back to the floor out of ICU.  He is seen walking with physical therapy and his mom.  He is much more steady today, still has a tremor, however has improved. He is medically stable per report of MD and self to discharge today.  Participation Level:  Active Participation Quality: Appropriate, Attentive, Other - See Comments (Engaged) Other Participation Quality:  Attentive, engaged and appropriate  Affect: Anxious, Flat Cognitive: Alert, Appropriate, Oriented Reaction to Medications/Concerns:  No reactions or concerns   Modes of Intervention: Discharge planning/support   Summary of Progress/Plan at Discharge  Summary of Progress/Plan at Discharge:  Patient seen in room to review information presented on 10/2.  Discussed options of inpatient vs outpatient. Patient at this time wants to discharge home and follow up with outpatient.  He has not completely ruled out inpatient, but reports "I just cannot make that call at this time".  He is educated about CD-IOP at [facility] with [name].  Call placed to [name] to notify her of patient and discuss case. She is willing to see him on Friday at 9am to complete interview.  He is given overview and information regarding program and reports he will follow up and attend.  He would like more information about inpatient and requested email to be sent to his account.  LCSW will complete request and follow up.  He will transport home by his mother who is in room at time of this follow up.  Mother very tearful and wanting patient to go to inpatient.  Some of time of assessment was allowing her to process her feelings and experience with patient being admitted and chronic alcohol use.  Patient did not engage with mother during this time, most work done with  LCSW. Patient was engaged and appreciative of consult and information. He reports he will follow up with this writer regarding his interview for Friday.  No other needs. LCSW will sign off at this time. MD and CM aware LCSW has completed consult.   Tony Jennings, MSW Clinical Social Work: Optician, dispensing Coverage for :  [phone]

## 2017-04-10 ENCOUNTER — Telehealth: Payer: Self-pay | Admitting: Professional Counselor

## 2017-04-10 DIAGNOSIS — R945 Abnormal results of liver function studies: Secondary | ICD-10-CM

## 2017-04-10 DIAGNOSIS — R7989 Other specified abnormal findings of blood chemistry: Secondary | ICD-10-CM

## 2017-04-10 NOTE — Telephone Encounter (Signed)
Tony Jennings called around 8:45am on 10/4 to express interest and wanting assistance with help in getting a referral to [facility] in [place], [place].  He reports he has thought about all that was discussed on 10/3, reviewed all information given upon discharge and emails that this writer sent regarding inpatient and felt this was the time to complete the program.  He gives verbal consent to send referral to inpatient treatment program and told to call treatment center to complete phone interview.  LCSW sent referral as requested and attached release of information. Awaiting call back from the treatment facility as well as Tony Jennings to assist in admitting him inpatient treatment.  [name], MSW Clinical Social Work: Optician, dispensing Coverage for :  [phone]

## 2017-04-11 ENCOUNTER — Ambulatory Visit (INDEPENDENT_AMBULATORY_CARE_PROVIDER_SITE_OTHER): Payer: 59 | Admitting: Psychology

## 2017-04-11 ENCOUNTER — Encounter (HOSPITAL_COMMUNITY): Payer: Self-pay | Admitting: Psychology

## 2017-04-11 DIAGNOSIS — F1020 Alcohol dependence, uncomplicated: Secondary | ICD-10-CM | POA: Diagnosis not present
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
## 2017-04-14 ENCOUNTER — Encounter (HOSPITAL_COMMUNITY): Payer: Self-pay | Admitting: Psychiatry
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
## 2017-04-16 ENCOUNTER — Ambulatory Visit (HOSPITAL_COMMUNITY): Payer: Self-pay | Admitting: Psychiatry

## 2017-04-16 ENCOUNTER — Other Ambulatory Visit (HOSPITAL_COMMUNITY): Payer: 59

## 2017-04-17 ENCOUNTER — Other Ambulatory Visit (HOSPITAL_COMMUNITY): Payer: 59

## 2017-04-17 ENCOUNTER — Telehealth (HOSPITAL_COMMUNITY): Payer: Self-pay | Admitting: Psychology
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
## 2017-04-21 ENCOUNTER — Other Ambulatory Visit (HOSPITAL_COMMUNITY): Payer: 59

## 2017-04-21 ENCOUNTER — Other Ambulatory Visit (HOSPITAL_COMMUNITY): Payer: 59 | Attending: Psychiatry | Admitting: Psychology

## 2017-04-21 ENCOUNTER — Ambulatory Visit (HOSPITAL_COMMUNITY): Payer: Self-pay | Admitting: Licensed Clinical Social Worker

## 2017-04-21 ENCOUNTER — Encounter (HOSPITAL_COMMUNITY): Payer: Self-pay | Admitting: Medical

## 2017-04-21 ENCOUNTER — Encounter: Payer: Self-pay | Admitting: Psychiatry

## 2017-04-21 VITALS — BP 126/80 | HR 100 | Ht 68.5 in | Wt 153.0 lb

## 2017-04-21 DIAGNOSIS — R569 Unspecified convulsions: Secondary | ICD-10-CM

## 2017-04-21 DIAGNOSIS — F10280 Alcohol dependence with alcohol-induced anxiety disorder: Secondary | ICD-10-CM

## 2017-04-21 DIAGNOSIS — G251 Drug-induced tremor: Secondary | ICD-10-CM | POA: Insufficient documentation

## 2017-04-21 DIAGNOSIS — F329 Major depressive disorder, single episode, unspecified: Secondary | ICD-10-CM | POA: Diagnosis not present

## 2017-04-21 DIAGNOSIS — F4310 Post-traumatic stress disorder, unspecified: Secondary | ICD-10-CM

## 2017-04-21 DIAGNOSIS — F4321 Adjustment disorder with depressed mood: Secondary | ICD-10-CM | POA: Diagnosis not present

## 2017-04-21 DIAGNOSIS — F10231 Alcohol dependence with withdrawal delirium: Secondary | ICD-10-CM | POA: Diagnosis not present

## 2017-04-21 DIAGNOSIS — F17210 Nicotine dependence, cigarettes, uncomplicated: Secondary | ICD-10-CM | POA: Insufficient documentation

## 2017-04-21 DIAGNOSIS — D696 Thrombocytopenia, unspecified: Secondary | ICD-10-CM

## 2017-04-21 DIAGNOSIS — F1094 Alcohol use, unspecified with alcohol-induced mood disorder: Secondary | ICD-10-CM

## 2017-04-21 DIAGNOSIS — K760 Fatty (change of) liver, not elsewhere classified: Secondary | ICD-10-CM | POA: Insufficient documentation

## 2017-04-21 DIAGNOSIS — Z79899 Other long term (current) drug therapy: Secondary | ICD-10-CM | POA: Diagnosis not present

## 2017-04-21 DIAGNOSIS — F19239 Other psychoactive substance dependence with withdrawal, unspecified: Secondary | ICD-10-CM | POA: Diagnosis not present

## 2017-04-21 DIAGNOSIS — F10239 Alcohol dependence with withdrawal, unspecified: Secondary | ICD-10-CM | POA: Diagnosis not present

## 2017-04-21 DIAGNOSIS — F411 Generalized anxiety disorder: Secondary | ICD-10-CM | POA: Diagnosis not present

## 2017-04-21 DIAGNOSIS — Z62819 Personal history of unspecified abuse in childhood: Secondary | ICD-10-CM | POA: Diagnosis not present

## 2017-04-21 DIAGNOSIS — F1020 Alcohol dependence, uncomplicated: Secondary | ICD-10-CM

## 2017-04-21 DIAGNOSIS — K709 Alcoholic liver disease, unspecified: Secondary | ICD-10-CM

## 2017-04-21 DIAGNOSIS — K860 Alcohol-induced chronic pancreatitis: Secondary | ICD-10-CM

## 2017-04-21 DIAGNOSIS — F10282 Alcohol dependence with alcohol-induced sleep disorder: Secondary | ICD-10-CM

## 2017-04-21 DIAGNOSIS — G610 Guillain-Barre syndrome: Secondary | ICD-10-CM

## 2017-04-21 DIAGNOSIS — F10931 Alcohol use, unspecified with withdrawal delirium: Secondary | ICD-10-CM

## 2017-04-21 DIAGNOSIS — F19939 Other psychoactive substance use, unspecified with withdrawal, unspecified: Secondary | ICD-10-CM

## 2017-04-21 MED ORDER — BACLOFEN 10 MG PO TABS: 10.0000 mg | ORAL_TABLET | Freq: Three times a day (TID) | ORAL | 1 refills | Status: DC

## 2017-04-21 MED ORDER — TRAZODONE HCL 50 MG PO TABS: 50.0000 mg | ORAL_TABLET | Freq: Every evening | ORAL | 2 refills | Status: DC | PRN
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
## 2017-04-23 ENCOUNTER — Other Ambulatory Visit (HOSPITAL_COMMUNITY): Payer: 59

## 2017-04-23 ENCOUNTER — Other Ambulatory Visit (HOSPITAL_COMMUNITY): Payer: 59 | Admitting: Psychology

## 2017-04-23 DIAGNOSIS — F1020 Alcohol dependence, uncomplicated: Secondary | ICD-10-CM

## 2017-04-23 DIAGNOSIS — F411 Generalized anxiety disorder: Secondary | ICD-10-CM

## 2017-04-24 ENCOUNTER — Other Ambulatory Visit (HOSPITAL_COMMUNITY): Payer: 59 | Admitting: Psychology

## 2017-04-24 ENCOUNTER — Other Ambulatory Visit (HOSPITAL_COMMUNITY): Payer: 59

## 2017-04-24 ENCOUNTER — Encounter (HOSPITAL_COMMUNITY): Payer: Self-pay | Admitting: Licensed Clinical Social Worker

## 2017-04-24 DIAGNOSIS — F1020 Alcohol dependence, uncomplicated: Secondary | ICD-10-CM

## 2017-04-24 DIAGNOSIS — F411 Generalized anxiety disorder: Secondary | ICD-10-CM

## 2017-04-25 ENCOUNTER — Encounter (HOSPITAL_COMMUNITY): Payer: Self-pay | Admitting: Licensed Clinical Social Worker

## 2017-04-25 ENCOUNTER — Encounter (HOSPITAL_COMMUNITY): Payer: Self-pay | Admitting: Psychology

## 2017-04-25 NOTE — Progress Notes (Signed)
CD-IOP TX PLANNING SESSION  THERAPIST PROGRESS NOTE  Session Time: 11-12  Participation Level: Active  Behavioral Response: Neat and Well GroomedAlertEuthymic  Type of Therapy: Individual Therapy  Treatment Goals addressed: Anxiety and Diagnosis: SUD  Interventions: CBT, Strength-based and Supportive  Summary: Tony Jennings is a 36 y.o. male who presents with recent attempt at sobriety w/ a relapse episode of over 1 month after having 1 year of sobriety. Pt presents today for individual tx planning session as part of CD-IOP. Pt signs all documentation stating he participated in tx planning. Pt established goals as 1. achieve and maintain sobriety 2. build social support for recovery 3. exercise at least 3x per week 4. play musical instrument at least 15min daily.  Pt is active and engaged in session. He presents as somewhat upbeat and intellectually interested in discussing political significance of chemical dependency. Counselor reminded him that these individual sessions are for him to grow his insight into his own addiction. Pt agreed.   Suicidal/Homicidal: Nowithout intent/plan  Therapist Response: CBT, joining, Person-centered planning  Plan: Return again in 1 weeks.  Diagnosis:    ICD-10-CM   1. Alcohol use disorder, severe, dependence (HCC) F10.20        Margo CommonWesley E Swan, LCAS-A 04/25/2017

## 2017-04-25 NOTE — Progress Notes (Signed)
    Daily Group Progress Note  Program: CD-IOP   04/25/2017 [name] [id number]  Diagnosis:  Alcohol use disorder, severe, dependence ([place])  Generalized anxiety disorder   Sobriety Date: 9/30  Group Time: 1-2:30  Participation Level: Active  Behavioral Response: Appropriate and Sharing  Type of Therapy: Process Group  Interventions: CBT and Motivational Interviewing  Topic: Patients were active and engaged in group process session. Pts shared about their challenges and successes in early recovery w/ an emphasis on "shining moments" and "speedbumps". Pts discussed newly acquired coping skills to help sustain sobriety. Some pts met w/ Darlyne Russian for medication management. Pts were supportive and challenging of each other and showed progress and therapeutic work.      Group Time: 2:30-4  Participation Level: Active  Behavioral Response: Appropriate and Sharing  Type of Therapy: Psycho-education Group  Interventions: CBT, Supportive and Reframing  Topic: Patients were active and engaged in group psychoeducation session. Pts and counselor were joined by [facility] who led an interactive, here-and-now experiencing exercise centered around "relapse prevention". Emphasis was on "feelings about relapse". Pts were also guided in a brief mindfulness meditation in which they focused on consciously relaxing their bodies from head to toe.   Summary: Pt reported attending 2 AA meetings. He was happy to report he secured a sponsor and "has already spoken to him 3 times". Pt was encouraged since his father attended AA w/ him and expressed interest and support for pt's recovery. Pt was open and sharing in group. He met w/ Darlyne Russian briefly to answer a medication question. Pt shared openly w/ Chaplain and admitted he "has felt very powerless" before and hopes to never return to drinking.    UDS collected: No Results: negative  AA/NA attended?: YesTuesday and  Wednesday  Sponsor?: Yes   Tony Jennings, LPCA LCASA 04/25/2017 2:34 PM
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
## 2017-04-28 ENCOUNTER — Other Ambulatory Visit (HOSPITAL_COMMUNITY): Payer: 59 | Admitting: Psychology

## 2017-04-28 ENCOUNTER — Other Ambulatory Visit (HOSPITAL_COMMUNITY): Payer: 59

## 2017-04-28 DIAGNOSIS — F1020 Alcohol dependence, uncomplicated: Secondary | ICD-10-CM | POA: Diagnosis not present

## 2017-04-29 ENCOUNTER — Encounter (HOSPITAL_COMMUNITY): Payer: Self-pay | Admitting: Licensed Clinical Social Worker

## 2017-04-29 ENCOUNTER — Encounter (HOSPITAL_COMMUNITY): Payer: Self-pay | Admitting: Psychology

## 2017-04-29 DIAGNOSIS — F411 Generalized anxiety disorder: Secondary | ICD-10-CM

## 2017-04-29 DIAGNOSIS — F1020 Alcohol dependence, uncomplicated: Secondary | ICD-10-CM

## 2017-04-29 NOTE — Progress Notes (Signed)
    Daily Group Progress Note  Program: CD-IOP   04/29/2017 Tony RiverJeffrey D Hoh [id number]  Diagnosis:  Alcohol use disorder, severe, dependence (HCC)  Generalized anxiety disorder   Sobriety Date: 9/30  Group Time: 1-2:30  Participation Level: Active  Behavioral Response: Appropriate and Sharing  Type of Therapy: Process Group  Interventions: CBT, Strength-based and Supportive  Topic: Patients were active and engaged in group process session with an emphasis on discussing early recovery strategies, "shining moments" and "speedbumps". Some UDS results were returned and discussed among group members.      Group Time: 2:30-4  Participation Level: Active  Behavioral Response: Appropriate and Sharing  Type of Therapy: Psycho-education Group  Interventions: CBT, Strength-based and Supportive  Topic: Patients were active and engaged in group psychoeducation session. Counselors facilitated discussion that centered around recovery-related topics such as AA, feelings, 12 Steps, and sobriety. Pts shared openly and gave and received feedback to each other.   Summary: Patient presented as active and engaged. He continues to have a slight tremor in his hands and agitated foot movements throughout session. He reported he attended 1 AA meeting and is "really getting along w/ his new sponsor". Pt inquired w/ another member about a specific [place] meeting's whereabouts. Pt admitted that "90% of his friends drink alcohol and are hard to be around for this reason". Pt was encouraged by other members to talk w/ his friends about asking for them not to drink around him. Pt agreed. Pt spoke on "friendships" in recovery and got supportive feedback from other members. Pt was open and sharing.   UDS collected: No Results: negative  AA/NA attended?: YesThursday  Sponsor?: Yes   Dorann LodgeWes Swan, LPCA LCASA 04/29/2017 8:17 AM

## 2017-04-30 ENCOUNTER — Encounter (HOSPITAL_COMMUNITY): Payer: Self-pay | Admitting: Licensed Clinical Social Worker

## 2017-04-30 ENCOUNTER — Other Ambulatory Visit (HOSPITAL_COMMUNITY): Payer: 59

## 2017-04-30 ENCOUNTER — Other Ambulatory Visit (HOSPITAL_COMMUNITY): Payer: 59 | Admitting: Psychology

## 2017-04-30 DIAGNOSIS — F1020 Alcohol dependence, uncomplicated: Secondary | ICD-10-CM

## 2017-04-30 NOTE — Progress Notes (Signed)
   THERAPIST PROGRESS NOTE  Session Time: 3-4pm  Participation Level: Active  Behavioral Response: NeatAlertEuthymic  Type of Therapy: Individual Therapy  Treatment Goals addressed: Anxiety and Coping  Interventions: CBT, Strength-based and Reframing  Summary: Tony RiverJeffrey D [place] is a 36 y.o. male who presents for individual weekly session as part of CDIOP. He states his sobriety date is the same and he is feeling "very good". He is upbeat and engaged in session. Pt and counselor discuss pt's goals of exercising 3x per week and pt reports he has run twice and swam once since last ind session. Pt reports he is also playing musical instruments daily which is his second goal. 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   Counselor and pt explore pt's experience of group thus far. Pt worries he is "coming off too strong or speaking too much". Counselor reflects that this is not at all how counselor interprets the pt's verbalizations. Counselor shares that pt is thoughtful and articulate when he talks and the group appears to respond to him appropriately.  Counselor and pt discuss pt's hx of "anti-authority" behavior including disdain for church leadership, govn't leadership, and "even my parents sometimes". Pt admits since he witnessed his sister publicly shamed in church that he no longer likes when other people try to tell him how to live. Pt admits the 12 Step program has been challenging for him to adopt but he is "loving his sponsor and working very closely w/ him".   Pt's medications, sleep, mood are all stable.   Suicidal/Homicidal: Nowithout intent/plan  Therapist Response: Counselor used person-centered tx plan, open questions, and reflections to help pt explore his past experience of authority figures and how his anger is connected to his drinking behaviors.  Plan: Return again in [place]weeks.  Diagnosis:    ICD-10-CM   1. Alcohol use disorder, severe, dependence  (HCC) F10.20   2. Generalized anxiety disorder F41.1       Margo CommonWesley E Swan, LCAS-A 04/30/2017

## 2017-05-01 ENCOUNTER — Other Ambulatory Visit (HOSPITAL_COMMUNITY): Payer: 59 | Admitting: Psychology

## 2017-05-01 ENCOUNTER — Other Ambulatory Visit (HOSPITAL_COMMUNITY): Payer: 59

## 2017-05-01 ENCOUNTER — Encounter (HOSPITAL_COMMUNITY): Payer: Self-pay | Admitting: Psychology

## 2017-05-01 DIAGNOSIS — F1020 Alcohol dependence, uncomplicated: Secondary | ICD-10-CM

## 2017-05-01 DIAGNOSIS — F411 Generalized anxiety disorder: Secondary | ICD-10-CM

## 2017-05-01 NOTE — Progress Notes (Signed)
    Daily Group Progress Note  Program: CD-IOP   05/01/2017 Tony Jennings [id number]  Diagnosis:  No diagnosis found.   Sobriety Date: 9/30  Group Time: 1-2:30pm  Participation Level: Active  Behavioral Response: Appropriate and Sharing  Type of Therapy: Process Group  Interventions: Supportive  Topic: Process: The first half of group was spent in process. Members shared about their struggles and successes in early recovery. Two members met with the program director during the session. Four random drug tests were collected.   Group Time: 2:30-4pm  Participation Level: Active  Behavioral Response: Sharing  Type of Therapy: Psycho-education Group  Interventions: Psychosocial Skills: Communication  Topic: Psycho-Ed: Dance movement psychotherapist Exercise: The second half of group was spent in a psycho-ed. Members broke up into couples and one member was given a drawing and the other a blank piece of paper. Sitting back to back, the member with the picture was to instruct their partner and explain how to draw their picture. The session was timed and at the end of the 10 minute exercise, a lively discussion ensued. The importance of communication was high-lighted and the ways to enhance communication identified.   Summary: The patient reported provided insightful feedback to a fellow group member. He shared his frustration at a meeting last night when they were reading out of the [employer], page 101. He felt the recommendation was inappropriate, especially for the 'newcomers' in the room. The page was retrieved and the context had clearly been misunderstood. The patient reported he had met with his counselor yesterday and he had helped him 'connect the dots' and it had been a very informative session. In the psycho-ed, the patient was the talker describing the picture for his partner to draw. The patient is a Corporate treasurer and he admitted that "I felt like I was hindered because my  partner is not a Electrical engineer". So, he explained, "I overthought it". The patient had been overheard using the word 'bifurcation' and members laughed as this was recounted during our discussion. "I don't even know what that means", one member exclaimed, and she wasn't the only one. The patient made some very helpful comments and his transparency is inspiring to his fellow group members. He responded well to this    UDS collected: No Results:   AA/NA attended?: [place] and Tuesday  Sponsor?: Yes   [name], [place] 05/01/2017 8:51 AM

## 2017-05-02 ENCOUNTER — Encounter (HOSPITAL_COMMUNITY): Payer: Self-pay | Admitting: Psychology
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
## 2017-05-02 NOTE — Progress Notes (Signed)
    Daily Group Progress Note  Program: CD-IOP   05/02/2017 Tony Jennings [id number]  Diagnosis:  No diagnosis found.   Sobriety Date: 9/30  Group Time: 1-2:30pm  Participation Level: Active  Behavioral Response: Sharing  Type of Therapy: Process  Interventions: Supportive  Topic: Process: The first half of group was spent in process. Members shared about the past weekend and identified any challenges or triumphs in early recovery. The program director met with two group members during the session today. Four random drug tests were collected.   Group Time: 2:30-4pm  Participation Level: Active  Behavioral Response: Appropriate  Type of Therapy: Psycho-Ed  Interventions: Strength-based  Topic: Psycho-Ed: Relapse Prevention: The second half of group was spent in a psycho-ed on Relapse Prevention. A handout was provided asking members to identify high, medium and no risk areas of their daily life. Members shared about what they had identified on the handout and where they must be vigilant if they are to remain drug-free. Members shared about their previous experiences, were very candid and revealing about themselves in their active addiction. The session ended with a graduation ceremony. One of the members was graduating successfully from the program and the graduation ceremony, including brownies and the medallion, were shared in this time. Two former group members, including the graduating [employer] sponsor, were present. Kind words of hope, respect and admiration were shared and the ceremony was a joyful celebration of progress.    Summary: The patient was engaged and shared openly in group. He reported a good weekend. He had attended two meetings and spoken frequently with his sponsor. "My sponsor has me doing a lot of work".The patient reported he had taken the group's suggestion and spoke to a few close friends about not drinking in front of him, at least for the time  being. Only one 'friend' responded negatively, suggesting he would drink when he felt like it. Although disappointing, the patient realized that perhaps he wasn't quite as good a friend as he had thought. In the psycho-ed, the patient easily identified high risk places, including music concerts, [employer] football games,holidays, certain clients and places in [place]. His low risk include being with his sponsor, around his family or at their home or at church. He wondered what he might do for the upcoming Halloween, always a guaranteed drunk. The patient provided helpful insight and accepted feedback from fellow group members. He responded well to this intervention.   UDS collected: Yes Results: negative  AA/NA attended?: Yes, Friday, Saturday  Sponsor?: Yes   [name], LCAS 05/02/2017 6:56 AM

## 2017-05-05 ENCOUNTER — Other Ambulatory Visit (HOSPITAL_COMMUNITY): Payer: 59 | Admitting: Psychology

## 2017-05-05 ENCOUNTER — Encounter (HOSPITAL_COMMUNITY): Payer: Self-pay | Admitting: Psychology

## 2017-05-05 ENCOUNTER — Other Ambulatory Visit (HOSPITAL_COMMUNITY): Payer: 59

## 2017-05-05 DIAGNOSIS — F411 Generalized anxiety disorder: Secondary | ICD-10-CM

## 2017-05-05 DIAGNOSIS — F1020 Alcohol dependence, uncomplicated: Secondary | ICD-10-CM | POA: Diagnosis not present

## 2017-05-05 NOTE — Progress Notes (Signed)
    Daily Group Progress Note  Program: CD-IOP   05/05/2017 Tony Jennings [id number]  Diagnosis:  Alcohol use disorder, severe, dependence ([place])  Generalized anxiety disorder   Sobriety Date: 9/29  Group Time: 1-2:30  Participation Level: Active  Behavioral Response: Appropriate and Sharing  Type of Therapy: Process Group  Interventions: CBT, Strength-based and Supportive  Topic: Patients were active and engaged in process group session. Some pts met w/ Darlyne Russian, PA for medication updates. Counselors administered a PHQ9 and GAD7 for all members. Pts shared about their challenges and successes, "speedbumps and shining moments" in recovery. Pts shared feedback and discussed their lives intimately. One member, who was presenting as "in denial" that his marriage was falling apart, was very open to feedback and shared intimately about his decision to sign separation paperwork. A guest pharmacist was present and observed group as part of her observation at [facility] for her residency. UDS results were returned to some individuals.      Group Time: 2:30-4  Participation Level: Active  Behavioral Response: Appropriate and Sharing  Type of Therapy: Psycho-education Group  Interventions: CBT and Assertiveness Training  Topic: Patients were active and engaged in group psychoeducation session. Counselors provided pts w/ a handout on communication and "refusal skills". Pt practiced imagining scenarios in which they would be asked if they wanted to drink or drug. Pts were encouraged to practice their specific responses in anticipation of being asked to use in the future.    Summary: Pt was active and engaged. He attended 2 AA meetings since last group. He reported having an overall positive weekend. He continued to develop his relationship w/ his girlfriend who has moved out but is "open to getting back together". Pt reported his work is going well and he is getting new  clients and feeling hopeful about this earnings. Pt reported intimately about his negative feelings towards his mother who apparently does not have a good relationship w/ pts girlfriend of 25yr. Pt stated he "often feels that he has to choose his gf or his parents". Pt asked for feedback and provided others w/ helpful and supportive feedback. Pt had a UDS result returned and all results were negative. Pt stated he continues to call his sponsor 2x per day.   UDS collected: No Results: negative  AA/NA attended?: YesMonday  Sponsor?: Yes   WYoulanda Roys LPCA LCASA 05/05/2017 4:45 PM

## 2017-05-07 ENCOUNTER — Encounter (HOSPITAL_COMMUNITY): Payer: Self-pay | Admitting: Medical

## 2017-05-07 ENCOUNTER — Other Ambulatory Visit (HOSPITAL_COMMUNITY): Payer: 59

## 2017-05-07 ENCOUNTER — Other Ambulatory Visit (INDEPENDENT_AMBULATORY_CARE_PROVIDER_SITE_OTHER): Payer: 59 | Admitting: Psychology

## 2017-05-07 DIAGNOSIS — F1020 Alcohol dependence, uncomplicated: Secondary | ICD-10-CM | POA: Diagnosis not present

## 2017-05-07 DIAGNOSIS — K860 Alcohol-induced chronic pancreatitis: Secondary | ICD-10-CM | POA: Diagnosis not present

## 2017-05-07 DIAGNOSIS — F10231 Alcohol dependence with withdrawal delirium: Secondary | ICD-10-CM | POA: Diagnosis not present

## 2017-05-07 DIAGNOSIS — F1094 Alcohol use, unspecified with alcohol-induced mood disorder: Secondary | ICD-10-CM

## 2017-05-07 DIAGNOSIS — F19239 Other psychoactive substance dependence with withdrawal, unspecified: Secondary | ICD-10-CM | POA: Diagnosis not present

## 2017-05-07 DIAGNOSIS — R569 Unspecified convulsions: Secondary | ICD-10-CM | POA: Diagnosis not present

## 2017-05-07 DIAGNOSIS — F411 Generalized anxiety disorder: Secondary | ICD-10-CM

## 2017-05-07 DIAGNOSIS — F10931 Alcohol use, unspecified with withdrawal delirium: Secondary | ICD-10-CM

## 2017-05-07 DIAGNOSIS — F19939 Other psychoactive substance use, unspecified with withdrawal, unspecified: Secondary | ICD-10-CM

## 2017-05-07 DIAGNOSIS — F10282 Alcohol dependence with alcohol-induced sleep disorder: Secondary | ICD-10-CM | POA: Diagnosis not present

## 2017-05-07 DIAGNOSIS — F10239 Alcohol dependence with withdrawal, unspecified: Secondary | ICD-10-CM

## 2017-05-07 DIAGNOSIS — Z62819 Personal history of unspecified abuse in childhood: Secondary | ICD-10-CM

## 2017-05-07 DIAGNOSIS — F4310 Post-traumatic stress disorder, unspecified: Secondary | ICD-10-CM | POA: Diagnosis not present

## 2017-05-07 DIAGNOSIS — F4321 Adjustment disorder with depressed mood: Secondary | ICD-10-CM

## 2017-05-07 DIAGNOSIS — K709 Alcoholic liver disease, unspecified: Secondary | ICD-10-CM | POA: Diagnosis not present

## 2017-05-07 DIAGNOSIS — G251 Drug-induced tremor: Secondary | ICD-10-CM

## 2017-05-07 DIAGNOSIS — G610 Guillain-Barre syndrome: Secondary | ICD-10-CM

## 2017-05-07 DIAGNOSIS — F10280 Alcohol dependence with alcohol-induced anxiety disorder: Secondary | ICD-10-CM | POA: Diagnosis not present

## 2017-05-07 MED ORDER — GABAPENTIN 100 MG PO CAPS: 100.0000 mg | ORAL_CAPSULE | Freq: Four times a day (QID) | ORAL | 0 refills | Status: DC

## 2017-05-07 MED ORDER — HM SUPER VITAMIN B COMPLEX/C PO TABS: 1.0000 | ORAL_TABLET | Freq: Every day | ORAL | 1 refills | Status: AC

## 2017-05-07 MED ORDER — ESCITALOPRAM OXALATE 10 MG PO TABS: 10.0000 mg | ORAL_TABLET | Freq: Every day | ORAL | 1 refills | Status: DC
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
## 2017-05-08 ENCOUNTER — Other Ambulatory Visit (HOSPITAL_COMMUNITY): Payer: 59

## 2017-05-08 ENCOUNTER — Other Ambulatory Visit (HOSPITAL_COMMUNITY): Payer: 59 | Attending: Psychiatry | Admitting: Psychology

## 2017-05-08 DIAGNOSIS — R569 Unspecified convulsions: Secondary | ICD-10-CM | POA: Diagnosis not present

## 2017-05-08 DIAGNOSIS — F411 Generalized anxiety disorder: Secondary | ICD-10-CM | POA: Diagnosis not present

## 2017-05-08 DIAGNOSIS — F1020 Alcohol dependence, uncomplicated: Secondary | ICD-10-CM | POA: Diagnosis not present

## 2017-05-08 DIAGNOSIS — F4310 Post-traumatic stress disorder, unspecified: Secondary | ICD-10-CM | POA: Insufficient documentation

## 2017-05-08 DIAGNOSIS — F17210 Nicotine dependence, cigarettes, uncomplicated: Secondary | ICD-10-CM | POA: Diagnosis not present

## 2017-05-08 DIAGNOSIS — F329 Major depressive disorder, single episode, unspecified: Secondary | ICD-10-CM | POA: Insufficient documentation

## 2017-05-08 DIAGNOSIS — Z8249 Family history of ischemic heart disease and other diseases of the circulatory system: Secondary | ICD-10-CM | POA: Diagnosis not present

## 2017-05-08 DIAGNOSIS — R251 Tremor, unspecified: Secondary | ICD-10-CM | POA: Diagnosis not present

## 2017-05-08 DIAGNOSIS — K860 Alcohol-induced chronic pancreatitis: Secondary | ICD-10-CM | POA: Diagnosis not present

## 2017-05-08 DIAGNOSIS — G610 Guillain-Barre syndrome: Secondary | ICD-10-CM | POA: Diagnosis not present

## 2017-05-08 DIAGNOSIS — Z79899 Other long term (current) drug therapy: Secondary | ICD-10-CM | POA: Insufficient documentation
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
## 2017-05-09 ENCOUNTER — Encounter (HOSPITAL_COMMUNITY): Payer: Self-pay | Admitting: Psychology

## 2017-05-09 NOTE — Progress Notes (Addendum)
    Daily Group Progress Note  Program: CD-IOP   05/09/2017 Tony Jennings [id number]  Diagnosis:  No diagnosis found.   Sobriety Date: 9/30  Group Time: 1-2:30pm  Participation Level: Active  Behavioral Response: Appropriate  Type of Therapy: Process Group  Interventions: Supportive  Topic: Process: The first half of group was spent in process with popsicle sticks. After checking in and sharing about their time since we last met, each member drew a popsicle stick and identified their word written on the stick. The words are taken from the 'Feeling Wheel' and members are asked to speak to the topic as it relates to their recovery and lives in sobriety. The group had a guest this afternoon from the PA program at [employer]. The student shared about her life and her intentions around graduation. Three group members were absent today.   Group Time: 2:30-4pm  Participation Level: Active  Behavioral Response: Sharing  Type of Therapy: Psycho-education Group  Interventions: Strength-based  Topic: Psycho-Ed: Emotional Jenga/Graduation. The second half of group was spent in a psycho-ed and the game of Jenga. Members pulled a block and shared what they felt, on a physical level, when they read this word. In other words, what physical effect did it have? The exercise teaches members to become more aware and familiar with their feelings. The session ended with a graduation ceremony. The graduating members' husband came to the graduation and her fellow group members shared words of admiration and appreciation. It was emotional and tearful for some and a very powerful ending to this session.   Summary: The patient reported he had met with a potential client whose product is coffee and he laughed as he described drinking numerous cups last night and being wired for much of the evening.  The word on the popsicle stick was 'willingness'. The patient admitted this was a tough one and he  will have to accept and become willing to address this disease for the 'rest of my life'. He reported he had thought his drinking would enhance or unleash his creative self, but he recognized this was just an attempt to rationalize his alcoholic drinking and did not enhance anything, including his creativity. In the psycho-ed, the patient's first block was amused, which he associates with a negative connotation, "I am not amused". Amused "hints of frustration and eye rolling". His next word was "startled' and he was able to identify in very clear physical sensations, what they meant to him. The patient was very validating in the graduation and spoke of the graduating members' energy and wonderful smile. The patient continues to make significant progress in early recovery and presents as calmer and more stable physically, than he had upon first arriving a couple of weeks ago.    UDS collected: No Results:   AA/NA attended?: No  Sponsor?: Yes    , LCAS 05/09/2017 9:54 AM 

## 2017-05-12 ENCOUNTER — Other Ambulatory Visit (HOSPITAL_COMMUNITY): Payer: 59

## 2017-05-12 ENCOUNTER — Other Ambulatory Visit (HOSPITAL_COMMUNITY): Payer: 59 | Admitting: Psychology

## 2017-05-12 DIAGNOSIS — F1020 Alcohol dependence, uncomplicated: Secondary | ICD-10-CM

## 2017-05-12 DIAGNOSIS — F411 Generalized anxiety disorder: Secondary | ICD-10-CM

## 2017-05-13 ENCOUNTER — Encounter (HOSPITAL_COMMUNITY): Payer: Self-pay

## 2017-05-13 NOTE — Progress Notes (Signed)
    Daily Group Progress Note  Program: CD-IOP   05/13/2017 CUTBERTO WINFREE [id number]  Diagnosis:  Alcohol use disorder, severe, dependence ([place])  Generalized anxiety disorder   Sobriety Date: 9/30  Group Time: 1-2:30  Participation Level: Active  Behavioral Response: Appropriate and Sharing  Type of Therapy: Process Group  Interventions: CBT and Motivational Interviewing  Topic: Patients were active and engaged in process session. Counselors led pts in discussing their challenges and successes in recovery. Some pts were administered UDS. Some pts met w/ Darlyne Russian for medication management. One pt discussed her upcoming endoscopy and feelings of fear it was causing.      Group Time: 2:30-4  Participation Level: Active  Behavioral Response: Appropriate and Sharing  Type of Therapy: Psycho-education Group  Interventions: Meditation: Chair Yoga  Topic: Patients were active and engaged in psychoeducation session. Counselors led a 1 hr chair yoga activity inviting pts to utilize yoga to improve mood, functionality, and reduce stress. Session was led by guest counselor and certified yoga for therapy instructor Jan Fireman, [place].   Summary: Pt was active and engaged in group though somewhat more quiet and reserved that previous sessions. He reported he attended 2 AA meetings and met w/ his sponsor to continue step work and gaining insight into his addiction. Pt reported he is working at his job more and feeling somewhat more accomplished since he is "getting so much more done in sobriety". Pt reported on his feelings about his mother and his girlfriend's relationship. Pt states he feels split b/w trying to make 2 different parties happy. Counselor reflected that pt appears more agitated and less engaged than previous sessions. Notably, pt used a negative comment to describe his sponsor's request to do more worksheets.   UDS collected: Yes Results: negative  AA/NA  attended?: YesSaturday and Sunday  Sponsor?: Yes   Youlanda Roys, LPCA LCASA 05/13/2017 4:18 PM

## 2017-05-14 ENCOUNTER — Other Ambulatory Visit (HOSPITAL_COMMUNITY): Payer: 59 | Admitting: Psychology

## 2017-05-14 ENCOUNTER — Other Ambulatory Visit (HOSPITAL_COMMUNITY): Payer: Self-pay | Admitting: Psychiatry

## 2017-05-14 ENCOUNTER — Other Ambulatory Visit (HOSPITAL_COMMUNITY): Payer: 59

## 2017-05-14 DIAGNOSIS — F1020 Alcohol dependence, uncomplicated: Secondary | ICD-10-CM

## 2017-05-15 ENCOUNTER — Telehealth (HOSPITAL_COMMUNITY): Payer: Self-pay | Admitting: Psychology

## 2017-05-15 ENCOUNTER — Other Ambulatory Visit (HOSPITAL_COMMUNITY): Payer: 59

## 2017-05-15 ENCOUNTER — Other Ambulatory Visit (HOSPITAL_COMMUNITY): Payer: 59 | Admitting: Psychology

## 2017-05-15 ENCOUNTER — Encounter (HOSPITAL_COMMUNITY): Payer: Self-pay | Admitting: Psychology

## 2017-05-15 DIAGNOSIS — F1020 Alcohol dependence, uncomplicated: Secondary | ICD-10-CM | POA: Diagnosis not present

## 2017-05-15 DIAGNOSIS — K860 Alcohol-induced chronic pancreatitis: Secondary | ICD-10-CM

## 2017-05-15 NOTE — Progress Notes (Signed)
    Daily Group Progress Note  Program: CD-IOP   05/15/2017 DIALLO PONDER [id number]  Diagnosis:  No diagnosis found.   Sobriety Date: 9/30  Group Time: 1-2:30pm  Participation Level: Active  Behavioral Response: Sharing  Type of Therapy: Process Group  Interventions: Supportive  Topic: Process: The first half of group was spent in process. Members shared about any shining moments or speed bumps in early recovery. One member shared that a close friend had overdosed on opiates yesterday. She was very upset and her fellow group members listened patiently as she described her angst. Another member laughed as she reviewed how upset she had been on Monday before an endoscopy scheduled for the next day. She had been certain something was terribly wrong. Today, she reported that the procedure had gone well and she was fine. The medical director met with one member and two drug tests were collected.   Group Time: 2:30-4pm  Participation Level: Active  Behavioral Response: Appropriate  Type of Therapy: Psycho-education Group  Interventions: Strength-based  Topic: Psycho-Ed: Retail buyer. The second half of group was spent with a visitor. One of the pharmacist's at [facility] came to speak with the group about drugs and their effects on brain and body and the medications that are used to address various mood disorders, anxiety as well ae medications prescribed to address cravings. Members asked pointed questions and the session was very informative for everyone present.   Summary: The patient reported was engaged and attentive. He responded to his fellow group member's disclosure about a friend dying yesterday. He reported it was a 'poignant eye opener' about the reality of this disease and how it has to be just one day at a time. 'A bad day could be your last day'. The patient noted, "this is fucking real" and pointed out that recovery required 'trusting other addicts'. He agreed  with other group members, reiterated that one reach out, and call if you're having a bad day. In the psycho-ed with the pharmacist, the patient asked helpful questions and was particularly interested in Campral, a medication for alcohol that addresses cravings. The patient shared that he had been prescribed Lexapro about a year ago after he entered [employer]. He continues to make measureable progress in early recovery and responded well to this intervention.   UDS collected: Yes Results: pending  AA/NA attended?: YesTuesday  Sponsor?: Yes   [name], LCAS 05/15/2017 8:36 AM

## 2017-05-16 ENCOUNTER — Other Ambulatory Visit (HOSPITAL_COMMUNITY): Payer: Self-pay | Admitting: Psychiatry

## 2017-05-19 ENCOUNTER — Encounter (HOSPITAL_COMMUNITY): Payer: Self-pay | Admitting: Psychology

## 2017-05-19 ENCOUNTER — Other Ambulatory Visit (HOSPITAL_COMMUNITY): Payer: 59

## 2017-05-19 ENCOUNTER — Other Ambulatory Visit (HOSPITAL_COMMUNITY): Payer: 59 | Admitting: Psychology

## 2017-05-19 DIAGNOSIS — F1020 Alcohol dependence, uncomplicated: Secondary | ICD-10-CM | POA: Diagnosis not present

## 2017-05-19 DIAGNOSIS — F411 Generalized anxiety disorder: Secondary | ICD-10-CM

## 2017-05-19 NOTE — Progress Notes (Signed)
    Daily Group Progress Note  Program: CD-IOP   05/19/2017 Magdalene RiverJeffrey D Korpi [id number]  Diagnosis:  No diagnosis found.   Sobriety Date: 9/30  Group Time: 1-2:30pm  Participation Level: Active  Behavioral Response: Appropriate and Sharing  Type of Therapy: Process Group  Interventions: Supportive  Topic: Process: The first half of group was spent in process. Members shared about their experiences in early recovery, identifying speed bumps and shining moments. A new group member was present and he introduced himself during this half of group. One drug test was collected while previously collected test results returned.   Group Time: 2:30-4pm  Participation Level: Active  Behavioral Response: Sharing  Type of Therapy: Psycho-education Group  Interventions: Strength-based  Topic: Psycho-Ed: From unhealthy thoughts to relapse prevention. The second half of group was spent in psycho-ed. members began to share the topic on their popsicle stick. The first word or phrase to be addressed in recovery was 'unhealthy thoughts'. The member seemed confused at first, but was eventually able to identify specific things he might to address these unhealthy thoughts. This generated a spontaneous review of the four steps that result in relapse: Trigger, thought, craving, use. This four-step process was discussed at length with all members asked to identify how they will address thoughts, change them and avoid moving into the craving or urge aspect? This generated a lively discussion and the session ended with members displaying a renewed intention around avoiding relapse.  Summary: The patient reported the rain and snow were always a trigger for him. He would always think about how much alcohol he had and [facility]. The patient reported he was speaking on the phone wit his [name] earlier today and she told him 'it's time to come back home'. This was  good welcome news, but he admitted it also made him nervous. There was a problem with trust that developed when hi relapse was finally revealed to her, their friends and family. The patient had opted to move to his parent's home for a while and give her some space. The patient provided helpful feedback to his fellow group members, including encouraging them to be more open and receptive to others in recovery. In the psycho-ed, the patient easily identified red flags and characteristics or attitudes that he will share with others, should he experience them. He shared some of the things he could have done, had he stuck with his program and committed to sobriety. Instead, he went without a sponsor and went on a year-long binge that got him into ICU for five days.  The patient was very open and honest about the process of relapse and his history is a classic case of a relapse occurring long before he drank. He responded well to this intervention.    UDS collected: No  AA/NA attended?: No  Sponsor?: Yes   Charmian Muffnn Ajax Schroll, LCAS 05/19/2017 9:13 AM

## 2017-05-20 ENCOUNTER — Encounter (HOSPITAL_COMMUNITY): Payer: Self-pay

## 2017-05-20 ENCOUNTER — Encounter (HOSPITAL_COMMUNITY): Payer: Self-pay | Admitting: Licensed Clinical Social Worker

## 2017-05-20 DIAGNOSIS — F10239 Alcohol dependence with withdrawal, unspecified: Secondary | ICD-10-CM

## 2017-05-20 DIAGNOSIS — F1020 Alcohol dependence, uncomplicated: Secondary | ICD-10-CM

## 2017-05-20 DIAGNOSIS — F10280 Alcohol dependence with alcohol-induced anxiety disorder: Secondary | ICD-10-CM

## 2017-05-20 NOTE — Progress Notes (Signed)
    Daily Group Progress Note  Program: CD-IOP   05/20/2017 Tony Jennings [id number]  Diagnosis:  Alcohol use disorder, severe, dependence (HCC)  Generalized anxiety disorder   Sobriety Date: 9/30  Group Time: 1-2:30  Participation Level: Active  Behavioral Response: Appropriate, Sharing, Rationalizing and Minimizing  Type of Therapy: Process Group  Interventions: CBT  Topic: Counselors led a patient centered process session designed to elicit pt's change talk, discuss their active recovery from mind-altering drugs and alcohol, and help motivate pts to sustain changes. Pts were encouraged to continue meeting their goals of attending AA/NA and using CBT based interventions to prevent relapse and tolerate distressing emotions.     Group Time: 2:30-4  Participation Level: Active  Behavioral Response: Appropriate and Sharing  Type of Therapy: Psycho-education Group  Interventions: CBT and Supportive  Topic: Counselors led a psychoeducation session aimed at increasing pt's understanding, and insight into their personal values. Pts did a "values sort" ranking their top 10 personal values. Pts discussed the process of sorting and choosing their values w/ each other. Pts appeared engaged and interested.    Summary: Pt was active and engaged in session. He attended 2 AA meetings since last session. He reported he was feeling "conflicted about how to respond to a person who said something problematic in an AA meeting". Counselor reflected that this pattern shows up often in pts life in which he blames his frustrations on other people's actions. Pt became somewhat defensive and shut down while minimiazing his emotional reaction to the confrontation. Pt became agitated and guarded in his posture. Pt continues to show intellectualizing bx towards his emotion states in group. Pt identified that he feels that "NA is starting to feel a lot like church, which I dislike". Pt continues  to display a negative outlook towards his recovery as compared to his initial 2 weeks in tx.  Pt "easily" identified his strongest value as "creativity".   UDS collected: Yes Results: pending  AA/NA attended?: YesFriday and Saturday  Sponsor?: Yes   Dorann LodgeWes Cate Oravec, LPCA LCASA 05/20/2017 2:52 PM

## 2017-05-21 ENCOUNTER — Other Ambulatory Visit (HOSPITAL_COMMUNITY): Payer: 59 | Admitting: Psychology

## 2017-05-21 ENCOUNTER — Encounter (HOSPITAL_COMMUNITY): Payer: Self-pay | Admitting: Licensed Clinical Social Worker

## 2017-05-21 ENCOUNTER — Other Ambulatory Visit (HOSPITAL_COMMUNITY): Payer: 59

## 2017-05-21 DIAGNOSIS — F1020 Alcohol dependence, uncomplicated: Secondary | ICD-10-CM

## 2017-05-21 NOTE — Progress Notes (Signed)
  CD-IOP INDIVIDUAL NOTE THERAPIST PROGRESS NOTE  Session Time: 3-4pm  Participation Level: Active  Behavioral Response: Casual and NeatAlertEuthymic  Type of Therapy: Individual Therapy  Treatment Goals addressed: Anxiety and Diagnosis: SUD  Interventions: CBT and Strength-based  Summary: Tony Jennings is a 36 y.o. male who presents with Alcohol Use Disorder and anxiety. He reports his experience in tx thus far has been good and he is seeing progress in all 4 of his goals. Specifically he has stayed sober, has a sponsor, attends AA meetings near daily, exercises at least 3 times per week, and plays musical instruments daily. He presents as active and engaged in session.  Counselors inquired about pt's recent cravings to which pt denied any cravings.  Counselor and pt spent time discussing pt's progress towards his person-centered goals. pt continued discussion that began in group therapy about his values-based living.   Suicidal/Homicidal: Nowithout intent/plan  Therapist Response: Pt appears to be engaging tx successfully and meeting all expectations for CD-IOP. He appears to be exhibiting some mild agitated and irritable bx in group session, though he denies "anything is wrong". Counselor will work to help pt gain insight into his negative cognitions that contribute to his wanting to use alcohol.   Plan: Return again in 1 weeks.  Diagnosis:    ICD-10-CM   1. Alcohol use disorder, severe, dependence (HCC) F10.20   2. 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(HCC) F10.239    F10.280        Margo CommonWesley E Swan, LCAS-A 05/21/2017

## 2017-05-22 ENCOUNTER — Other Ambulatory Visit (HOSPITAL_COMMUNITY): Payer: 59

## 2017-05-22 ENCOUNTER — Other Ambulatory Visit (HOSPITAL_COMMUNITY): Payer: 59 | Admitting: Psychology

## 2017-05-22 ENCOUNTER — Encounter (HOSPITAL_COMMUNITY): Payer: Self-pay

## 2017-05-22 DIAGNOSIS — F1020 Alcohol dependence, uncomplicated: Secondary | ICD-10-CM | POA: Diagnosis not present

## 2017-05-22 DIAGNOSIS — F411 Generalized anxiety disorder: Secondary | ICD-10-CM

## 2017-05-22 NOTE — Progress Notes (Signed)
    Daily Group Progress Note  Program: CD-IOP   05/22/2017 Magdalene RiverJeffrey D Beazer [id number]  Diagnosis:  Alcohol use disorder, severe, dependence (HCC)  Generalized anxiety disorder   Sobriety Date: 9/30  Group Time: 1-2:30  Participation Level: Active  Behavioral Response: Appropriate and Sharing  Type of Therapy: Process Group  Interventions: CBT and Strength-based  Topic: Pts were active and engaged in process session in which pts were encouraged to share about their recovery from mind-altering drugs and alcohol. Pts were encouraged to consider their tx goals of sobriety, social support, and any person-centered goals when checking in.      Group Time: 2:30-4  Participation Level: Active  Behavioral Response: Appropriate and Sharing  Type of Therapy: Psycho-education Group  Interventions: CBT and Other: Sleep Diet Exercise importance  Topic: Pts were active and engaged in psychoeducational session with an emphasis on "sleep, diet, exercise, and general self care strategies". Enriqueta ShutterJamie Aethis from [facility], led a 1 hour powerpoint presentation on the subject. Pts asked multiple questions and shared openly about their struggles and successes in self care.    Summary: Pt was active and engaged in session. He presented as somewhat withdrawn and reported he had not attended any AA meetings since yesterday. He did meet w/ his sponsor. Pt continues trend of vocalizing irritabilities in the form of light humor and sarcasm. Pt was engaged in psychoeducation session and described his own exercise routine and his diet. Pt shared openly about his dog who passed away one year ago and how he spent time yesterday remembering him fondly.    UDS collected: No Results: negative  AA/NA attended?: No  Sponsor?: Yes   Dorann LodgeWes Swan, LPCA LCASA 05/22/2017 4:45 PM

## 2017-05-26 ENCOUNTER — Other Ambulatory Visit (HOSPITAL_COMMUNITY): Payer: 59

## 2017-05-26 ENCOUNTER — Encounter (HOSPITAL_COMMUNITY): Payer: Self-pay | Admitting: Psychology

## 2017-05-26 NOTE — Progress Notes (Signed)
    Daily Group Progress Note  Program: CD-IOP   05/26/2017 Tony Jennings [id number]  Diagnosis:  No diagnosis found.   Sobriety Date: 9/30  Group Time: 1-2:30pm  Participation Level: Active  Behavioral Response: Appropriate and Sharing  Type of Therapy: Process Group  Interventions: Supportive  Topic: Process: The first half of group was spent in process. Members shared about their shining moments and any speed bumps they had experienced since we last met. A new group member was present, and she introduced herself and told the group about her struggles with alcoholism. The medical director met with two group members and three drug tests were collected today.  Group Time: 2:30-4pm  Participation Level: Minimal  Behavioral Response: distant  Type of Therapy: Mindfulness Exercise/Psycho-education  Interventions: Strength-based  Topic: Mindfulness/Psycho-ed; Values, part 2: the second half of group began with a five-minute mindfulness exercise. It entailed breathing in and saying, 'I am breathing in' and breathing out and saying, "I have breathing out". Members had various responses at the conclusion of the exercise. The remainder of the session was spent in a psycho-ed. The topic was 'Values' and was a second part of the session begun on Monday. Members were asked to identify how they can direct their behaviors to line up with their values. There was good feedback and discussion among the group.  Summary: The patient offered supportive feedback to a fellow group member who expressed frustration over the stigma of addiction, specifically in the legal arena. This patient laughed and noted that when he first entered treatment a year ago at [facility], his parents assumed he would be in the upper tier of addicts and thinking their son had nothing in common with their stereotypical image of the average 'alcoholic'. "At least it's not meth", the patient reported. After the  mindfulness exercise, the patient admitted that "I had defaulted to my own practice", but it had been relaxing for him. The patient was noticeably quiet during the psycho-ed, but when questioned, he was able to identify 'creativity' as being something he values. The patient made some pointed comments in session today, but seemed distracted or distant in the final part of group. We will continue to follow closely ion the days ahead.  UDS collected: No Results:   AA/NA attended?: No  Sponsor?: Yes   [name], LCAS 05/26/2017 8:28 AM

## 2017-05-28 ENCOUNTER — Other Ambulatory Visit (HOSPITAL_COMMUNITY): Payer: 59

## 2017-06-02 ENCOUNTER — Other Ambulatory Visit (HOSPITAL_COMMUNITY): Payer: 59 | Admitting: Psychology

## 2017-06-02 ENCOUNTER — Other Ambulatory Visit (HOSPITAL_COMMUNITY): Payer: 59

## 2017-06-02 DIAGNOSIS — F1020 Alcohol dependence, uncomplicated: Secondary | ICD-10-CM | POA: Diagnosis not present

## 2017-06-02 DIAGNOSIS — F411 Generalized anxiety disorder: Secondary | ICD-10-CM

## 2017-06-03 ENCOUNTER — Encounter (HOSPITAL_COMMUNITY): Payer: Self-pay | Admitting: Psychology

## 2017-06-03 NOTE — Progress Notes (Signed)
    Daily Group Progress Note  Program: CD-IOP   06/03/2017 Magdalene RiverJeffrey D Kilmartin [id number]  Diagnosis:  Alcohol use disorder, severe, dependence (HCC)  Generalized anxiety disorder   Sobriety Date: 9/30  Group Time: 1-2:30  Participation Level: Active  Behavioral Response: Appropriate and Sharing  Type of Therapy: Psycho-education Group  Interventions: CBT and Supportive  Topic: Pts were active and engaged in session. Pts discussed their long holiday weekend away from CD-IOP. One pt graduated successfully from CD-IOP and had multiple family members present for final 15 min of session. Pts were mostly upbeat and stated that they had positive experiences the last few days. Pts discussed coping skills for distress tolerance and mindfulness to help support their recovery from mind-altering drugs. UDS were collected from some members. One member "forgot" to use UDS cup when she urinated and asked if she could "redo" it tomorrow. Pt was reminded that an unfulfilled UDS was considered a negative result.      Group Time: 2:30-4  Participation Level: Active  Behavioral Response: Appropriate and Sharing  Type of Therapy: Psycho-education Group  Interventions: CBT and Strength-based  Topic: Pts were active and engaged for psychoeducation session w/ a discussion on "control and higher power". Pts listened to 5 min of an audio clip by Duaine DredgeWayne Dyer discussing "Letting go of control". Pts were then asked to comment on their experience of trying to control things and how that worked out for them.     Summary: Pt was active and engaged in session. He reported on his time away from CD-IOP since [place] and missed the last 3 group sessions. Pt stated he attended AA daily while on vacation and also talked to his sponsor daily. Pt recognized that the "culture of AA was very different down in [place] but it was helpful to stay focused on recovery, even when on a  break". Pt denied any intense emotions or thoughts of using while in [place]. Pt was attentive and focused during discussion on "control and spirituality".   UDS collected: Yes Results: pending  AA/NA attended?: YesWednesday, Thursday, Friday, Saturday and Sunday  Sponsor?: Yes   Dorann LodgeWes Lachandra Dettmann, LPCA LCASA 06/03/2017 2:35 PM

## 2017-06-04 ENCOUNTER — Encounter (HOSPITAL_COMMUNITY): Payer: Self-pay | Admitting: Medical

## 2017-06-04 ENCOUNTER — Other Ambulatory Visit (HOSPITAL_COMMUNITY): Payer: Self-pay | Admitting: Medical

## 2017-06-04 ENCOUNTER — Encounter (HOSPITAL_COMMUNITY): Payer: Self-pay | Admitting: Licensed Clinical Social Worker

## 2017-06-04 ENCOUNTER — Other Ambulatory Visit (INDEPENDENT_AMBULATORY_CARE_PROVIDER_SITE_OTHER): Payer: 59 | Admitting: Psychology

## 2017-06-04 ENCOUNTER — Other Ambulatory Visit (HOSPITAL_COMMUNITY): Payer: 59

## 2017-06-04 DIAGNOSIS — F1020 Alcohol dependence, uncomplicated: Secondary | ICD-10-CM

## 2017-06-04 DIAGNOSIS — F4321 Adjustment disorder with depressed mood: Secondary | ICD-10-CM

## 2017-06-04 DIAGNOSIS — F19939 Other psychoactive substance use, unspecified with withdrawal, unspecified: Secondary | ICD-10-CM

## 2017-06-04 DIAGNOSIS — Z62819 Personal history of unspecified abuse in childhood: Secondary | ICD-10-CM

## 2017-06-04 DIAGNOSIS — F10239 Alcohol dependence with withdrawal, unspecified: Secondary | ICD-10-CM

## 2017-06-04 DIAGNOSIS — F10280 Alcohol dependence with alcohol-induced anxiety disorder: Secondary | ICD-10-CM

## 2017-06-04 DIAGNOSIS — K860 Alcohol-induced chronic pancreatitis: Secondary | ICD-10-CM

## 2017-06-04 DIAGNOSIS — F10282 Alcohol dependence with alcohol-induced sleep disorder: Secondary | ICD-10-CM

## 2017-06-04 DIAGNOSIS — F10231 Alcohol dependence with withdrawal delirium: Secondary | ICD-10-CM

## 2017-06-04 DIAGNOSIS — F10232 Alcohol dependence with withdrawal with perceptual disturbance: Secondary | ICD-10-CM

## 2017-06-04 DIAGNOSIS — F19239 Other psychoactive substance dependence with withdrawal, unspecified: Secondary | ICD-10-CM

## 2017-06-04 DIAGNOSIS — F0630 Mood disorder due to known physiological condition, unspecified: Secondary | ICD-10-CM

## 2017-06-04 DIAGNOSIS — G610 Guillain-Barre syndrome: Secondary | ICD-10-CM

## 2017-06-04 DIAGNOSIS — F10931 Alcohol use, unspecified with withdrawal delirium: Secondary | ICD-10-CM

## 2017-06-04 DIAGNOSIS — F4310 Post-traumatic stress disorder, unspecified: Secondary | ICD-10-CM

## 2017-06-04 DIAGNOSIS — K709 Alcoholic liver disease, unspecified: Secondary | ICD-10-CM

## 2017-06-04 DIAGNOSIS — F411 Generalized anxiety disorder: Secondary | ICD-10-CM

## 2017-06-04 DIAGNOSIS — R569 Unspecified convulsions: Secondary | ICD-10-CM

## 2017-06-04 DIAGNOSIS — G251 Drug-induced tremor: Secondary | ICD-10-CM
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
## 2017-06-05 ENCOUNTER — Other Ambulatory Visit (HOSPITAL_COMMUNITY): Payer: 59

## 2017-06-05 ENCOUNTER — Other Ambulatory Visit (HOSPITAL_COMMUNITY): Payer: 59 | Admitting: Psychology

## 2017-06-05 DIAGNOSIS — F1020 Alcohol dependence, uncomplicated: Secondary | ICD-10-CM
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
## 2017-06-06 ENCOUNTER — Encounter (HOSPITAL_COMMUNITY): Payer: Self-pay | Admitting: Licensed Clinical Social Worker

## 2017-06-06 NOTE — Progress Notes (Signed)
CD-IOP INDIVIDUAL THERAPIST NOTE  THERAPIST PROGRESS NOTE  Session Time: 4-5pm  Participation Level: Active  Behavioral Response: Neat and Well GroomedAlertEuthymic  Type of Therapy: Individual Therapy  Treatment Goals addressed: Diagnosis: SUD,  Interventions: CBT, Strength-based and Supportive  Summary: Tony Jennings is a 36 y.o. male who presents with Alcohol Use Disorder and generalized anxiety. Pt states he is feeling positive and hopeful about his ability to manage his recovery. He continues to talk to his sponsor daily and go to [employer] meetings nearly everyday. He reports less agitation, worry, and depression.  Pt and counselor discuss pt's time in CD-IOP and lessons that pt has learned through group counseling.    Suicidal/Homicidal: Nowithout intent/plan  Therapist Response: Counselor used CBT to help pt set agenda of preparing to end tx in CD-IOP and come up w/ an action plan for his continued success.   Plan: Return again in 1 weeks.  Diagnosis:    ICD-10-CM   1. Alcohol use disorder, severe, dependence (HCC) F10.20   2. 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

## 2017-06-09 ENCOUNTER — Other Ambulatory Visit (HOSPITAL_COMMUNITY): Payer: 59

## 2017-06-09 ENCOUNTER — Other Ambulatory Visit (HOSPITAL_COMMUNITY): Payer: 59 | Attending: Psychiatry | Admitting: Psychology

## 2017-06-09 ENCOUNTER — Encounter (HOSPITAL_COMMUNITY): Payer: Self-pay

## 2017-06-09 ENCOUNTER — Encounter (HOSPITAL_COMMUNITY): Payer: Self-pay | Admitting: Medical

## 2017-06-09 ENCOUNTER — Encounter (HOSPITAL_COMMUNITY): Payer: Self-pay | Admitting: Psychology

## 2017-06-09 DIAGNOSIS — Z62819 Personal history of unspecified abuse in childhood: Secondary | ICD-10-CM | POA: Insufficient documentation

## 2017-06-09 DIAGNOSIS — G479 Sleep disorder, unspecified: Secondary | ICD-10-CM | POA: Insufficient documentation

## 2017-06-09 DIAGNOSIS — K709 Alcoholic liver disease, unspecified: Secondary | ICD-10-CM | POA: Insufficient documentation

## 2017-06-09 DIAGNOSIS — F19239 Other psychoactive substance dependence with withdrawal, unspecified: Secondary | ICD-10-CM

## 2017-06-09 DIAGNOSIS — G251 Drug-induced tremor: Secondary | ICD-10-CM

## 2017-06-09 DIAGNOSIS — Z79899 Other long term (current) drug therapy: Secondary | ICD-10-CM | POA: Insufficient documentation

## 2017-06-09 DIAGNOSIS — K860 Alcohol-induced chronic pancreatitis: Secondary | ICD-10-CM | POA: Diagnosis not present

## 2017-06-09 DIAGNOSIS — F10239 Alcohol dependence with withdrawal, unspecified: Secondary | ICD-10-CM

## 2017-06-09 DIAGNOSIS — F19939 Other psychoactive substance use, unspecified with withdrawal, unspecified: Secondary | ICD-10-CM

## 2017-06-09 DIAGNOSIS — F39 Unspecified mood [affective] disorder: Secondary | ICD-10-CM | POA: Diagnosis not present

## 2017-06-09 DIAGNOSIS — F4321 Adjustment disorder with depressed mood: Secondary | ICD-10-CM

## 2017-06-09 DIAGNOSIS — F10939 Alcohol use, unspecified with withdrawal, unspecified: Secondary | ICD-10-CM

## 2017-06-09 DIAGNOSIS — F4310 Post-traumatic stress disorder, unspecified: Secondary | ICD-10-CM

## 2017-06-09 DIAGNOSIS — G610 Guillain-Barre syndrome: Secondary | ICD-10-CM | POA: Diagnosis not present

## 2017-06-09 DIAGNOSIS — F1020 Alcohol dependence, uncomplicated: Secondary | ICD-10-CM | POA: Diagnosis present

## 2017-06-09 DIAGNOSIS — D696 Thrombocytopenia, unspecified: Secondary | ICD-10-CM | POA: Diagnosis not present

## 2017-06-09 DIAGNOSIS — F10282 Alcohol dependence with alcohol-induced sleep disorder: Secondary | ICD-10-CM

## 2017-06-09 DIAGNOSIS — F0630 Mood disorder due to known physiological condition, unspecified: Secondary | ICD-10-CM

## 2017-06-09 DIAGNOSIS — R569 Unspecified convulsions: Secondary | ICD-10-CM

## 2017-06-09 DIAGNOSIS — F1094 Alcohol use, unspecified with alcohol-induced mood disorder: Secondary | ICD-10-CM

## 2017-06-09 MED ORDER — BACLOFEN 10 MG PO TABS: 10.0000 mg | ORAL_TABLET | Freq: Three times a day (TID) | ORAL | 1 refills | Status: AC

## 2017-06-09 MED ORDER — GABAPENTIN 100 MG PO CAPS: ORAL_CAPSULE | ORAL | 2 refills | Status: DC

## 2017-06-09 MED ORDER — ESCITALOPRAM OXALATE 10 MG PO TABS: 10.0000 mg | ORAL_TABLET | Freq: Every day | ORAL | 1 refills | Status: DC

## 2017-06-09 NOTE — Progress Notes (Signed)
  [facility]charge Summary   Magdalene RiverJeffrey D Gerhart [id number]  Date of Admission: 04/21/2017 Date of Discharge: 06/09/2017  Course of Treatment: [facility] treatment of DTs related to chronic alcoholism with significant medical consequences including chronic pancreatitis and alcoholic liver disease from 16 years of use. Pt admitted that for the first time and despite previous injury and illness he now sees he cannot safely drink alcohol. Up until now he says he was arrogant enough to believe he could return to drinking.  Family issues around parents disapproval of his current Girlfriend were of concern as well but patient has resolved to address them with his parents and does not feel he is at risk for relapse.  Goals and Activities to Help Maintain Sobriety: 1. Stay away from people.places and things that are triggers to alcohol use. 2. Continue practicing Fair Fighting rules in interpersonal conflicts. 3. Continue alcohol and drug refusal skills and call on support systems. 4. Continue to understand the need to keep recovery first avoiding the first drink in any and all situations  Referrals: NA  Medication List   baclofen 10 MG tablet  Commonly known as: LIORESAL  Take 1 tablet (10 mg total) by mouth 3 (three) times daily.   escitalopram 10 MG tablet  Commonly known as: LEXAPRO  Take 1 tablet (10 mg total) by mouth at bedtime.   folic acid 1 MG tablet  Commonly known as: FOLVITE  Take 1 tablet (1 mg total) by mouth daily.   gabapentin 100 MG capsule  Commonly known as: NEURONTIN  TAKE 1 CAPSULE (100 MG TOTAL) BY MOUTH 4 (FOUR) TIMES DAILY.   HM SUPER VITAMIN B COMPLEX/C Tabs  Take 1 tablet by mouth daily.   LACTULOSE PO  Take by mouth.   MELATONIN PO  Take by mouth.   multivitamin with minerals Tabs tablet  Take 1 tablet by mouth daily.   traZODone 50 MG tablet  Commonly  known as: DESYREL  Take 1 tablet (50 mg total) by mouth at bedtime as needed and may repeat dose one time if needed for sleep.     [facility] with Dorann LodgeWes Swan  1. Attend AA as often as you drank 2. Keep on [place] a sponsor and [place] home group [place]. 3. Return to Dr Gilmore LarocheAkhtar for medication management  Next appointment: Aftercare  Plan of Action to Address Continuing Problems: As above also see Counselor's discharge    Client has participated in the development of this discharge plan and has received a copy of this completed plan  Maryjean MornCharles Nathaly Dawkins  06/09/2017   [facility]M 06/09/2017
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
## 2017-06-11 ENCOUNTER — Other Ambulatory Visit (HOSPITAL_COMMUNITY): Payer: 59
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
## 2017-06-12 ENCOUNTER — Other Ambulatory Visit (HOSPITAL_COMMUNITY): Payer: 59

## 2017-06-16 ENCOUNTER — Other Ambulatory Visit (HOSPITAL_COMMUNITY): Payer: 59

## 2017-06-18 ENCOUNTER — Other Ambulatory Visit (HOSPITAL_COMMUNITY): Payer: 59

## 2017-06-19 ENCOUNTER — Other Ambulatory Visit (HOSPITAL_COMMUNITY): Payer: 59

## 2017-06-23 ENCOUNTER — Other Ambulatory Visit (HOSPITAL_COMMUNITY): Payer: 59

## 2017-06-25 ENCOUNTER — Other Ambulatory Visit (HOSPITAL_COMMUNITY): Payer: 59

## 2017-06-26 ENCOUNTER — Other Ambulatory Visit (HOSPITAL_COMMUNITY): Payer: 59

## 2017-06-30 ENCOUNTER — Other Ambulatory Visit (HOSPITAL_COMMUNITY): Payer: 59

## 2017-07-02 ENCOUNTER — Other Ambulatory Visit (HOSPITAL_COMMUNITY): Payer: 59

## 2017-07-03 ENCOUNTER — Other Ambulatory Visit (HOSPITAL_COMMUNITY): Payer: 59

## 2017-07-07 ENCOUNTER — Other Ambulatory Visit (HOSPITAL_COMMUNITY): Payer: 59

## 2017-07-09 ENCOUNTER — Other Ambulatory Visit (HOSPITAL_COMMUNITY): Payer: 59

## 2017-07-10 ENCOUNTER — Other Ambulatory Visit (HOSPITAL_COMMUNITY): Payer: 59

## 2017-07-14 ENCOUNTER — Other Ambulatory Visit (HOSPITAL_COMMUNITY): Payer: 59

## 2017-07-16 ENCOUNTER — Other Ambulatory Visit (HOSPITAL_COMMUNITY): Payer: 59

## 2017-08-14 ENCOUNTER — Ambulatory Visit (INDEPENDENT_AMBULATORY_CARE_PROVIDER_SITE_OTHER): Payer: 59 | Admitting: Family Medicine

## 2017-08-14 ENCOUNTER — Encounter: Payer: Self-pay | Admitting: Family Medicine

## 2017-08-14 VITALS — BP 118/72 | HR 95 | Temp 99.1°F | Resp 16 | Ht 69.0 in | Wt 155.0 lb

## 2017-08-14 DIAGNOSIS — F10239 Alcohol dependence with withdrawal, unspecified: Secondary | ICD-10-CM | POA: Diagnosis not present

## 2017-08-14 DIAGNOSIS — F1011 Alcohol abuse, in remission: Secondary | ICD-10-CM

## 2017-08-14 DIAGNOSIS — F10280 Alcohol dependence with alcohol-induced anxiety disorder: Secondary | ICD-10-CM

## 2017-08-14 DIAGNOSIS — F418 Other specified anxiety disorders: Secondary | ICD-10-CM | POA: Diagnosis not present

## 2017-08-14 DIAGNOSIS — F411 Generalized anxiety disorder: Secondary | ICD-10-CM

## 2017-08-14 DIAGNOSIS — Z87898 Personal history of other specified conditions: Secondary | ICD-10-CM

## 2017-08-14 MED ORDER — GABAPENTIN 100 MG PO CAPS
ORAL_CAPSULE | ORAL | 2 refills | Status: DC
Start: 2017-08-14 — End: 2018-01-20

## 2017-08-14 NOTE — Assessment & Plan Note (Signed)
con't with meds and counseling

## 2017-08-14 NOTE — Assessment & Plan Note (Signed)
con't meds Pt to make appointment with psych-- he was given name and numbers for psych in area-- and [facility] rto prn

## 2017-08-14 NOTE — Progress Notes (Signed)
Subjective:  I acted as a Neurosurgeon for [employer]. Fuller Song, RMA   Patient ID: Tony Jennings, male    DOB: [birth year], 37 y.o.   MRN: [id number]  Chief Complaint  Patient presents with  . Medication Problem    insurance not covering medications    HPI  Patient is in today for medication -- he was in IOP with [facility] and when he was D/C d they refilled his med but he did not have f/u with psych.  His ins is not paying for gabepentin -- because of the dosing he says.  No other problems.    Patient Care Team: Zola Button, Grayling Congress, DO as PCP - General (Family Medicine)   Past Medical History:  Diagnosis Date  . Acute hypokalemia   . Acute hyponatremia   . Alcohol abuse   . Alcohol-induced chronic pancreatitis (HCC)   . ALD (alcoholic liver disease) (HCC)   . Anemia 04/05/2017  . Anxiety   . Depression   . Hepatic steatosis   . Seizures (HCC)   . 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 WRIST SURGERY      Family History  Problem Relation Age of Onset  . Hypertension Mother   . Hypertension Father     Social History   Socioeconomic History  . Marital status: Divorced    Spouse name: Not on file  . Number of children: Not on file  . Years of education: Not on file  . Highest education level: Not on file  Social Needs  . Financial resource strain: Not on file  . Food insecurity - worry: Not on file  . Food insecurity - inability: Not on file  . Transportation needs - medical: Not on file  . Transportation needs - non-medical: Not on file  Occupational History  . Occupation: Risk analyst  Tobacco Use  . Smoking status: Current Every Day Smoker    Packs/day: 0.25    Years: 10.00    Pack years: 2.50    Types: Cigarettes  . Smokeless tobacco: Never Used  . Tobacco comment: reduce the # cig  Substance and Sexual Activity  . Alcohol use: No    Alcohol/week: 0.0 oz    Comment: Drank heavily for years in past.   Recenlty drinking more with anxiety.   . Drug use: No    Comment: prior use marijuana, snorted ritalin, rare cocain. (But this was when 37 yo)  . Sexual activity: Yes    Partners: Female  Other Topics Concern  . Not on file  Social History Narrative  . Not on file    Outpatient Medications Prior to Visit  Medication Sig Dispense Refill  . baclofen (LIORESAL) 10 MG tablet Take 1 tablet (10 mg total) by mouth 3 (three) times daily. 90 tablet 1  . escitalopram (LEXAPRO) 10 MG tablet Take 1 tablet (10 mg total) by mouth at bedtime. 30 tablet 1  . folic acid (FOLVITE) 1 MG tablet Take 1 tablet (1 mg total) by mouth daily. 30 tablet 1  . LACTULOSE PO Take by mouth.    . MELATONIN PO Take by mouth.    . Multiple Vitamin (MULTIVITAMIN WITH MINERALS) TABS tablet Take 1 tablet by mouth daily.    Marland Kitchen gabapentin (NEURONTIN) 100 MG capsule TAKE 1 CAPSULE (100 MG TOTAL) BY MOUTH 4 (FOUR) TIMES DAILY. 120 capsule 2  . traZODone (DESYREL) 50 MG tablet Take 1 tablet (50 mg  total) by mouth at bedtime as needed and may repeat dose one time if needed for sleep. 60 tablet 2   No facility-administered medications prior to visit.     Allergies  Allergen Reactions  . Citalopram Other (See Comments)    Tremors    Review of Systems  Constitutional: Negative for chills, fever and malaise/fatigue.  HENT: Negative for congestion and hearing loss.   Eyes: Negative for discharge.  Respiratory: Negative for cough, sputum production and shortness of breath.   Cardiovascular: Negative for chest pain, palpitations and leg swelling.  Gastrointestinal: Negative for abdominal pain, blood in stool, constipation, diarrhea, heartburn, nausea and vomiting.  Genitourinary: Negative for dysuria, frequency, hematuria and urgency.  Musculoskeletal: Negative for back pain, falls and myalgias.  Skin: Negative for rash.  Neurological: Negative for dizziness, sensory change, loss of consciousness, weakness and headaches.    Endo/Heme/Allergies: Negative for environmental allergies. Does not bruise/bleed easily.  Psychiatric/Behavioral: Negative for depression and suicidal ideas. The patient is not nervous/anxious and does not have insomnia.        Objective:    Physical Exam  Constitutional: He is oriented to person, place, and time. Vital signs are normal. He appears well-developed and well-nourished. He is sleeping.  HENT:  Head: Normocephalic and atraumatic.  Mouth/Throat: Oropharynx is clear and moist.  Eyes: EOM are normal. Pupils are equal, round, and reactive to light.  Neck: Normal range of motion. Neck supple. No thyromegaly present.  Cardiovascular: Normal rate and regular rhythm.  No murmur heard. Pulmonary/Chest: Effort normal and breath sounds normal. No respiratory distress. He has no wheezes. He has no rales. He exhibits no tenderness.  Musculoskeletal: He exhibits no edema or tenderness.  Neurological: He is alert and oriented to person, place, and time.  Skin: Skin is warm and dry.  Psychiatric: He has a normal mood and affect. His behavior is normal. Judgment and thought content normal.  Nursing note and vitals reviewed.   BP 118/72 (BP Location: Left Arm, Patient Position: Sitting, Cuff Size: Normal)   Pulse 95   Temp 99.1 F (37.3 C) (Oral)   Resp 16   Ht 5\' 9"  (1.753 m)   Wt 155 lb (70.3 kg)   SpO2 98%   BMI 22.89 kg/m  Wt Readings from Last 3 Encounters:  08/14/17 155 lb (70.3 kg)  04/05/17 145 lb 8.1 oz (66 kg)  12/27/16 142 lb (64.4 kg)   BP Readings from Last 3 Encounters:  08/14/17 118/72  04/09/17 (!) 133/93  12/27/16 (!) 152/93      There is no immunization history on file for this patient.  Health Maintenance  Topic Date Due  . INFLUENZA VACCINE  04/16/2018 (Originally 02/05/2017)  . TETANUS/TDAP  02/27/2026  . HIV Screening  Completed    Lab Results  Component Value Date   WBC 4.5 04/09/2017   HGB 9.3 (L) 04/09/2017   HCT 30.5 (L) 04/09/2017   PLT  117 (L) 04/09/2017   GLUCOSE 103 (H) 04/09/2017   ALT 58 04/09/2017   AST 202 (H) 04/09/2017   NA 138 04/09/2017   K 3.2 (L) 04/09/2017   CL 107 04/09/2017   CREATININE 0.62 04/09/2017   BUN 5 (L) 04/09/2017   CO2 25 04/09/2017   INR 1.00 04/07/2017    No results found for: TSH Lab Results  Component Value Date   WBC 4.5 04/09/2017   HGB 9.3 (L) 04/09/2017   HCT 30.5 (L) 04/09/2017   MCV 93.8 04/09/2017   PLT  117 (L) 04/09/2017   Lab Results  Component Value Date   NA 138 04/09/2017   K 3.2 (L) 04/09/2017   CO2 25 04/09/2017   GLUCOSE 103 (H) 04/09/2017   BUN 5 (L) 04/09/2017   CREATININE 0.62 04/09/2017   BILITOT 1.4 (H) 04/09/2017   ALKPHOS 140 (H) 04/09/2017   AST 202 (H) 04/09/2017   ALT 58 04/09/2017   PROT 5.9 (L) 04/09/2017   ALBUMIN 2.9 (L) 04/09/2017   CALCIUM 8.4 (L) 04/09/2017   ANIONGAP 6 04/09/2017   GFR 98.56 05/27/2016   No results found for: CHOL No results found for: HDL No results found for: LDLCALC No results found for: TRIG No results found for: CHOLHDL No results found for: ZOXW9U       Assessment & Plan:   Problem List Items Addressed This Visit      Unprioritized   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 Mctigue maintain his multivitamin with minerals, folic acid, traZODone, LACTULOSE PO, MELATONIN PO, baclofen, escitalopram, and gabapentin.  Meds ordered this encounter    Medications  . gabapentin (NEURONTIN) 100 MG capsule    Sig: TAKE 1 CAPSULE (100 MG TOTAL) BY MOUTH 4 (FOUR) TIMES DAILY.    Dispense:  120 capsule    Refill:  2    CMA served as scribe during this visit. History, Physical and Plan performed by medical provider. Documentation and orders reviewed and attested to.  Donato Schultz, DO

## 2017-08-14 NOTE — Patient Instructions (Signed)

## 2017-10-07 ENCOUNTER — Other Ambulatory Visit (HOSPITAL_COMMUNITY): Payer: Self-pay | Admitting: Medical

## 2017-11-18 ENCOUNTER — Other Ambulatory Visit (HOSPITAL_COMMUNITY): Payer: Self-pay | Admitting: Medical

## 2017-12-15 ENCOUNTER — Other Ambulatory Visit (HOSPITAL_COMMUNITY): Payer: Self-pay | Admitting: Medical

## 2017-12-22 ENCOUNTER — Other Ambulatory Visit: Payer: Self-pay | Admitting: Family Medicine

## 2017-12-22 NOTE — Telephone Encounter (Signed)
Copied from CRM [id number]. Topic: Quick Communication - Rx Refill/Question >> Dec 22, 2017  2:13 PM Oneal GroutSebastian, Jennifer S wrote: Medication: traZODone (DESYREL) 50 MG tablet   Has the patient contacted their pharmacy? Yes.   (Agent: If no, request that the patient contact the pharmacy for the refill.) (Agent: If yes, when and what did the pharmacy advise?)  Preferred Pharmacy (with phone number or street name): [facility]  Agent: Please be advised that RX refills may take up to 3 business days. We ask that you follow-up with your pharmacy.

## 2017-12-23 NOTE — Telephone Encounter (Signed)
Medication has been pended for provider review 

## 2017-12-24 NOTE — Telephone Encounter (Signed)
Pt called to check the status of getting the Rx refilled

## 2017-12-25 ENCOUNTER — Telehealth: Payer: Self-pay | Admitting: Family Medicine

## 2017-12-25 ENCOUNTER — Other Ambulatory Visit: Payer: Self-pay | Admitting: Family Medicine

## 2017-12-25 DIAGNOSIS — G4700 Insomnia, unspecified: Secondary | ICD-10-CM

## 2017-12-25 MED ORDER — TRAZODONE HCL 50 MG PO TABS: 50.0000 mg | ORAL_TABLET | Freq: Every evening | ORAL | 2 refills | Status: DC | PRN

## 2017-12-25 NOTE — Telephone Encounter (Signed)
Copied from CRM [id number]. Topic: Quick Communication - Rx Refill/Question >> Dec 25, 2017 12:20 PM Burchel, Abbi R wrote: Medication: traZODone (DESYREL) 50 MG tablet  Has the patient contacted their pharmacy? Yes.     Preferred Pharmacy:   [id number] [place] [place] -  [address] [phone] (Phone) [phone] (Fax)  Pt is aware that this rx is expired and he is no longer under the care of thr original prescriber Maryjean Morn(Charles Kober, PA-C).  He has asked Dr Laury AxonLowne to prescribe this med for him and she agreed per OV Note from 08/14/17.  Pt is requesting for Dr Laury AxonLowne to send over a new Rx with her as the prescriber. Please contact patient when request is completed. Pt Phone: [phone]

## 2017-12-25 NOTE — Telephone Encounter (Signed)
Yes they should be filling

## 2017-12-25 NOTE — Telephone Encounter (Signed)
Behavioral health has been filling this do you want to send to them?

## 2017-12-25 NOTE — Telephone Encounter (Signed)
done

## 2017-12-25 NOTE — Telephone Encounter (Signed)
Copied from CRM [id number]. Topic: Quick Communication - Rx Refill/Question >> Dec 25, 2017 12:28 PM Burchel, Abbi R wrote: Medication:traZODone (DESYREL) 50 MG tablet  Has the patient contacted their pharmacy? Yes.    Preferred [facility]acy: [id number] [place],  -  [address]  [phone] (Phone) [phone] (Fax)  Pt states that he is aware that this rx is expired because he is no longer under the original prescirber's Maryjean Morn(Charles Kober, PA-C) care.  He has requested that Dr. Laury AxonLowne to take over mgmt of this med and she agreed per OV note from 08/14/17.  Pt is requesting that a new Rx be sent to pharmacy under Dr. Laury AxonLowne.  Please call pt (ph: [phone])  when this is completed.  Pt advised to f/up with pharmacy in 48-72hrs.      Agent: Please be advised that RX refills may take up to 3 business days. We ask that you follow-up with your pharmacy.

## 2017-12-25 NOTE — Telephone Encounter (Signed)
[facility]: pt's request for trazodone. Apolonio SchneidersSheketia, MA, checking with Dr. Laury AxonLowne regarding refilling vs. Having behavioral health refill.

## 2017-12-26 NOTE — Telephone Encounter (Signed)
Patient notified that rx has been sent in. He stated he was not seeing behavorial health anymore and he talked about this with Dr. Laury AxonLowne at his visit in Feb.

## 2018-01-20 ENCOUNTER — Other Ambulatory Visit: Payer: Self-pay | Admitting: Family Medicine

## 2018-01-20 DIAGNOSIS — F418 Other specified anxiety disorders: Secondary | ICD-10-CM

## 2018-04-16 ENCOUNTER — Other Ambulatory Visit: Payer: Self-pay | Admitting: Family Medicine

## 2018-04-16 DIAGNOSIS — G4700 Insomnia, unspecified: Secondary | ICD-10-CM

## 2018-05-11 ENCOUNTER — Other Ambulatory Visit: Payer: Self-pay | Admitting: Family Medicine

## 2018-05-11 DIAGNOSIS — G4700 Insomnia, unspecified: Secondary | ICD-10-CM

## 2018-05-11 NOTE — Telephone Encounter (Signed)
Last office visit on 08/14/2017 Last refill on 04/16/2018  #60 no refills.

## 2018-06-17 ENCOUNTER — Other Ambulatory Visit: Payer: Self-pay | Admitting: Family Medicine

## 2018-06-17 DIAGNOSIS — F418 Other specified anxiety disorders: Secondary | ICD-10-CM

## 2018-06-22 ENCOUNTER — Encounter: Payer: Self-pay | Admitting: Family Medicine

## 2018-08-16 ENCOUNTER — Other Ambulatory Visit: Payer: Self-pay | Admitting: Family Medicine

## 2018-08-16 DIAGNOSIS — G4700 Insomnia, unspecified: Secondary | ICD-10-CM

## 2018-10-14 ENCOUNTER — Encounter: Payer: Self-pay | Admitting: *Deleted

## 2018-11-09 ENCOUNTER — Other Ambulatory Visit: Payer: Self-pay | Admitting: Family Medicine

## 2018-11-09 DIAGNOSIS — G4700 Insomnia, unspecified: Secondary | ICD-10-CM

## 2018-12-05 ENCOUNTER — Other Ambulatory Visit: Payer: Self-pay | Admitting: Family Medicine

## 2018-12-05 DIAGNOSIS — G4700 Insomnia, unspecified: Secondary | ICD-10-CM

## 2018-12-18 ENCOUNTER — Other Ambulatory Visit: Payer: Self-pay

## 2018-12-18 ENCOUNTER — Ambulatory Visit (INDEPENDENT_AMBULATORY_CARE_PROVIDER_SITE_OTHER): Payer: 59 | Admitting: Family Medicine

## 2018-12-18 DIAGNOSIS — F418 Other specified anxiety disorders: Secondary | ICD-10-CM | POA: Diagnosis not present

## 2018-12-18 DIAGNOSIS — G4700 Insomnia, unspecified: Secondary | ICD-10-CM | POA: Diagnosis not present

## 2018-12-18 DIAGNOSIS — R634 Abnormal weight loss: Secondary | ICD-10-CM

## 2018-12-18 MED ORDER — TRAZODONE HCL 50 MG PO TABS: ORAL_TABLET | ORAL | 5 refills | Status: DC

## 2018-12-18 MED ORDER — GABAPENTIN 100 MG PO CAPS: ORAL_CAPSULE | ORAL | 2 refills | Status: DC

## 2018-12-18 NOTE — Progress Notes (Deleted)
Patient ID: Tony Jennings, male    DOB: 1981/04/10  Age: 38 y.o. MRN: [id number]    Subjective:  Subjective  HPI Tony Jennings presents for ***  Review of Systems  History Past Medical History:  Diagnosis Date   Acute hypokalemia    Acute hyponatremia    Alcohol abuse    Alcohol-induced chronic pancreatitis (HCC)    ALD (alcoholic liver disease) (HCC)    Anemia 04/05/2017   Anxiety    Depression    Hepatic steatosis    Seizures (HCC)    Ulnar neuropathy at elbow 10/24/2014    He has a past surgical history that includes Wrist surgery.   His family history includes Hypertension in his father and mother.He reports that he has been smoking cigarettes. He has a 2.50 pack-year smoking history. He has never used smokeless tobacco. He reports that he does not drink alcohol or use drugs.  Current Outpatient Medications on File Prior to Visit  Medication Sig Dispense Refill   escitalopram (LEXAPRO) 10 MG tablet Take 1 tablet (10 mg total) by mouth at bedtime. 30 tablet 1   folic acid (FOLVITE) 1 MG tablet Take 1 tablet (1 mg total) by mouth daily. 30 tablet 1   LACTULOSE PO Take by mouth.     MELATONIN PO Take by mouth.     Multiple Vitamin (MULTIVITAMIN WITH MINERALS) TABS tablet Take 1 tablet by mouth daily.     [DISCONTINUED] buPROPion (WELLBUTRIN SR) 100 MG 12 hr tablet Take 1 tablet (100 mg total) by mouth daily. (Patient not taking: Reported on 02/04/2017) 30 tablet 0   [DISCONTINUED] propranolol (INDERAL) 10 MG tablet Take 0.5 tablets (5 mg total) by mouth 2 (two) times daily. 30 tablet 0   [DISCONTINUED] sertraline (ZOLOFT) 25 MG tablet TAKE ONE TABLET BY MOUTH ONCE DAILY 83 tablet 0   No current facility-administered medications on file prior to visit.      Objective:  Objective  Physical Exam There were no vitals taken for this visit. Wt Readings from Last 3 Encounters:  08/14/17 155 lb (70.3 kg)  04/05/17 145 lb 8.1 oz (66 kg)  12/27/16 142 lb  (64.4 kg)     Lab Results  Component Value Date   WBC 4.5 04/09/2017   HGB 9.3 (L) 04/09/2017   HCT 30.5 (L) 04/09/2017   PLT 117 (L) 04/09/2017   GLUCOSE 103 (H) 04/09/2017   ALT 58 04/09/2017   AST 202 (H) 04/09/2017   NA 138 04/09/2017   K 3.2 (L) 04/09/2017   CL 107 04/09/2017   CREATININE 0.62 04/09/2017   BUN 5 (L) 04/09/2017   CO2 25 04/09/2017   INR 1.00 04/07/2017    Ct Head Wo Contrast  Result Date: 04/05/2017 CLINICAL DATA:  38 year old male with acute altered mental status, dizziness and ataxia. EXAM: CT HEAD WITHOUT CONTRAST TECHNIQUE: Contiguous axial images were obtained from the base of the skull through the vertex without intravenous contrast. COMPARISON:  None. FINDINGS: Brain: No evidence of infarction, hemorrhage, hydrocephalus, extra-axial collection or mass lesion/mass effect. Vascular: No hyperdense vessel or unexpected calcification. Skull: Normal. Negative for fracture or focal lesion. Sinuses/Orbits: No acute finding. Other: None. IMPRESSION: Unremarkable noncontrast head CT. Electronically Signed   By: Harmon PierJeffrey  Hu M.D.   On: 04/05/2017 09:35   Mr Brain Wo Contrast  Result Date: 04/05/2017 CLINICAL DATA:  Initial evaluation for acute ataxia, suspect stroke. EXAM: MRI HEAD WITHOUT CONTRAST TECHNIQUE: Multiplanar, multiecho pulse sequences of the brain and surrounding  structures were obtained without intravenous contrast. COMPARISON:  Prior CT from earlier the same day. FINDINGS: Brain: Study degraded by motion artifact. Cerebral volume within normal limits. No obvious focal parenchymal signal abnormality. No abnormal foci of restricted diffusion to suggest acute or subacute ischemia. Gray-white matter differentiation maintained. No encephalomalacia to suggest chronic infarction. No evidence for acute or chronic intracranial hemorrhage. No mass lesion, midline shift or mass effect. No hydrocephalus. No extra-axial fluid collection. Major dural sinuses grossly  patent. Pituitary gland suprasellar region normal. Midline structures intact and normal. Vascular: Major intracranial vascular flow voids grossly maintained. Skull and upper cervical spine: Craniocervical junction normal. Upper cervical spine within normal limits. Bone marrow signal intensity normal. No scalp soft tissue abnormality. Sinuses/Orbits: Globes and orbital soft tissues grossly within normal limits. Paranasal sinuses are clear. No mastoid effusion. Other: None. IMPRESSION: Normal brain MRI.  No acute intracranial abnormality identified. Electronically Signed   By: Jeannine Boga M.D.   On: 04/05/2017 15:12     Assessment & Plan:  Plan  I have changed [name]. [name]'s gabapentin. I am also having him maintain his multivitamin with minerals, folic acid, LACTULOSE PO, MELATONIN PO, escitalopram, and traZODone.  Meds ordered this encounter  Medications   traZODone (DESYREL) 50 MG tablet    Sig: PLEASE SEE ATTACHED FOR DETAILED DIRECTIONS    Dispense:  30 tablet    Refill:  5    Pt needs virtual visit for further refills.   gabapentin (NEURONTIN) 100 MG capsule    Sig: 1 po bid    Dispense:  180 capsule    Refill:  2    Problem List Items Addressed This Visit    None    Visit Diagnoses    Insomnia, unspecified type       Relevant Medications   traZODone (DESYREL) 50 MG tablet   Depression with anxiety       Relevant Medications   traZODone (DESYREL) 50 MG tablet   gabapentin (NEURONTIN) 100 MG capsule      Follow-up: No follow-ups on file.  [name], DO

## 2018-12-19 ENCOUNTER — Encounter: Payer: Self-pay | Admitting: Family Medicine

## 2018-12-19 DIAGNOSIS — R634 Abnormal weight loss: Secondary | ICD-10-CM | POA: Insufficient documentation

## 2018-12-19 NOTE — Progress Notes (Signed)
Virtual Visit via Video Note  I connected with Tony Jennings on 12/19/18 at  2:00 PM EDT by a video enabled telemedicine application and verified that I am speaking with the correct person using two identifiers.  Location: Patient: home  Provider: office    I discussed the limitations of evaluation and management by telemedicine and the availability of in person appointments. The patient expressed understanding and agreed to proceed.  History of Present Illness: The pt is home with only complaint of weight loss---  20 lbs but then realized it occurred after he stopped drinking  The medications are working well for sleep and anxiety and he is still sober.  No other complaints.     Observations/Objective: No vitals obtained Pt is in NAD  Assessment and Plan: .1. Insomnia, unspecified type Stable  con't meds  - traZODone (DESYREL) 50 MG tablet; PLEASE SEE ATTACHED FOR DETAILED DIRECTIONS  Dispense: 30 tablet; Refill: 5  2. Depression with anxiety Stable  - gabapentin (NEURONTIN) 100 MG capsule; 1 po bid  Dispense: 180 capsule; Refill: 2  3. Weight loss After he stopped drinking  He struggles with eating 3 meals a day due to work We discussed drinking a high cal protein shake while working to see if that helps him gain a little weight  He will f/u with [place] if needed for this *   Follow Up Instructions:    I discussed the assessment and treatment plan with the patient. The patient was provided an opportunity to ask questions and all were answered. The patient agreed with the plan and demonstrated an understanding of the instructions.   The patient was advised to call back or seek an in-person evaluation if the symptoms worsen or if the condition fails to improve as anticipated.  I provided 25 minutes of non-face-to-face time during this encounter.   [name], DO

## 2018-12-19 NOTE — Assessment & Plan Note (Signed)
After he stopped drinking  He struggles with eating 3 meals a day due to work We discussed drinking a high cal protein shake while working to see if that helps him gain a little weight  He will f/u with [place] if needed for this

## 2019-03-17 ENCOUNTER — Other Ambulatory Visit: Payer: Self-pay

## 2019-03-22 ENCOUNTER — Other Ambulatory Visit: Payer: Self-pay

## 2019-03-22 ENCOUNTER — Encounter: Payer: Self-pay | Admitting: Family Medicine

## 2019-03-22 ENCOUNTER — Ambulatory Visit (INDEPENDENT_AMBULATORY_CARE_PROVIDER_SITE_OTHER): Payer: 59 | Admitting: Family Medicine

## 2019-03-22 VITALS — BP 100/88 | HR 89 | Temp 97.9°F | Resp 18 | Ht 69.0 in | Wt 150.2 lb

## 2019-03-22 DIAGNOSIS — Z0000 Encounter for general adult medical examination without abnormal findings: Secondary | ICD-10-CM | POA: Insufficient documentation

## 2019-03-22 DIAGNOSIS — F411 Generalized anxiety disorder: Secondary | ICD-10-CM

## 2019-03-22 DIAGNOSIS — R1011 Right upper quadrant pain: Secondary | ICD-10-CM | POA: Insufficient documentation

## 2019-03-22 DIAGNOSIS — K760 Fatty (change of) liver, not elsewhere classified: Secondary | ICD-10-CM

## 2019-03-22 DIAGNOSIS — Z0001 Encounter for general adult medical examination with abnormal findings: Secondary | ICD-10-CM

## 2019-03-22 HISTORY — DX: Encounter for general adult medical examination without abnormal findings: Z00.00

## 2019-03-22 LAB — LIPID PANEL
Cholesterol: 160 mg/dL (ref 0–200)
HDL: 37.9 mg/dL — ABNORMAL LOW (ref >39.00)
LDL Cholesterol: 103 mg/dL — ABNORMAL HIGH (ref 0–99)
NonHDL: 121.94
Total CHOL/HDL Ratio: 4
Triglycerides: 94 mg/dL (ref 0.0–149.0)
VLDL: 18.8 mg/dL (ref 0.0–40.0)

## 2019-03-22 LAB — CBC WITH DIFFERENTIAL/PLATELET
Basophils Absolute: 0.1 10*3/uL (ref 0.0–0.1)
Basophils Relative: 0.8 % (ref 0.0–3.0)
Eosinophils Absolute: 0.2 10*3/uL (ref 0.0–0.7)
Eosinophils Relative: 2.6 % (ref 0.0–5.0)
HCT: 48 % (ref 39.0–52.0)
Hemoglobin: 16.1 g/dL (ref 13.0–17.0)
Lymphocytes Relative: 27.2 % (ref 12.0–46.0)
Lymphs Abs: 1.9 10*3/uL (ref 0.7–4.0)
MCHC: 33.5 g/dL (ref 30.0–36.0)
MCV: 86.7 fl (ref 78.0–100.0)
Monocytes Absolute: 0.3 10*3/uL (ref 0.1–1.0)
Monocytes Relative: 4.6 % (ref 3.0–12.0)
Neutro Abs: 4.5 10*3/uL (ref 1.4–7.7)
Neutrophils Relative %: 64.8 % (ref 43.0–77.0)
Platelets: 134 10*3/uL — ABNORMAL LOW (ref 150.0–400.0)
RBC: 5.54 Mil/uL (ref 4.22–5.81)
RDW: 13.4 % (ref 11.5–15.5)
WBC: 7 10*3/uL (ref 4.0–10.5)

## 2019-03-22 LAB — COMPREHENSIVE METABOLIC PANEL
ALT: 16 U/L (ref 0–53)
AST: 16 U/L (ref 0–37)
Albumin: 4.8 g/dL (ref 3.5–5.2)
Alkaline Phosphatase: 49 U/L (ref 39–117)
BUN: 12 mg/dL (ref 6–23)
CO2: 29 mEq/L (ref 19–32)
Calcium: 10 mg/dL (ref 8.4–10.5)
Chloride: 103 mEq/L (ref 96–112)
Creatinine, Ser: 0.78 mg/dL (ref 0.40–1.50)
GFR: 110.52 mL/min (ref >60.00)
Glucose, Bld: 86 mg/dL (ref 70–99)
Potassium: 4.4 mEq/L (ref 3.5–5.1)
Sodium: 141 mEq/L (ref 135–145)
Total Bilirubin: 0.6 mg/dL (ref 0.2–1.2)
Total Protein: 7.1 g/dL (ref 6.0–8.3)

## 2019-03-22 LAB — TSH: TSH: 1.05 u[IU]/mL (ref 0.35–4.50)

## 2019-03-22 NOTE — Assessment & Plan Note (Signed)
Check us abd  ?Check labs  ? ?

## 2019-03-22 NOTE — Patient Instructions (Signed)
Preventive Care 19-39 Years Old, Male Preventive care refers to lifestyle choices and visits with your health care provider that can promote health and wellness. This includes:  A yearly physical exam. This is also called an annual well check.  Regular dental and eye exams.  Immunizations.  Screening for certain conditions.  Healthy lifestyle choices, such as eating a healthy diet, getting regular exercise, not using drugs or products that contain nicotine and tobacco, and limiting alcohol use. What can I expect for my preventive care visit? Physical exam Your health care provider will check:  Height and weight. These may be used to calculate body mass index (BMI), which is a measurement that tells if you are at a healthy weight.  Heart rate and blood pressure.  Your skin for abnormal spots. Counseling Your health care provider may ask you questions about:  Alcohol, tobacco, and drug use.  Emotional well-being.  Home and relationship well-being.  Sexual activity.  Eating habits.  Work and work Statistician. What immunizations do I need?  Influenza (flu) vaccine  This is recommended every year. Tetanus, diphtheria, and pertussis (Tdap) vaccine  You may need a Td booster every 10 years. Varicella (chickenpox) vaccine  You may need this vaccine if you have not already been vaccinated. Human papillomavirus (HPV) vaccine  If recommended by your health care provider, you may need three doses over 6 months. Measles, mumps, and rubella (MMR) vaccine  You may need at least one dose of MMR. You may also need a second dose. Meningococcal conjugate (MenACWY) vaccine  One dose is recommended if you are 45-76 years old and a Market researcher living in a residence hall, or if you have one of several medical conditions. You may also need additional booster doses. Pneumococcal conjugate (PCV13) vaccine  You may need this if you have certain conditions and were not  previously vaccinated. Pneumococcal polysaccharide (PPSV23) vaccine  You may need one or two doses if you smoke cigarettes or if you have certain conditions. Hepatitis A vaccine  You may need this if you have certain conditions or if you travel or work in places where you may be exposed to hepatitis A. Hepatitis B vaccine  You may need this if you have certain conditions or if you travel or work in places where you may be exposed to hepatitis B. Haemophilus influenzae type b (Hib) vaccine  You may need this if you have certain risk factors. You may receive vaccines as individual doses or as more than one vaccine together in one shot (combination vaccines). Talk with your health care provider about the risks and benefits of combination vaccines. What tests do I need? Blood tests  Lipid and cholesterol levels. These may be checked every 5 years starting at age 17.  Hepatitis C test.  Hepatitis B test. Screening   Diabetes screening. This is done by checking your blood sugar (glucose) after you have not eaten for a while (fasting).  Sexually transmitted disease (STD) testing. Talk with your health care provider about your test results, treatment options, and if necessary, the need for more tests. Follow these instructions at home: Eating and drinking   Eat a diet that includes fresh fruits and vegetables, whole grains, lean protein, and low-fat dairy products.  Take vitamin and mineral supplements as recommended by your health care provider.  Do not drink alcohol if your health care provider tells you not to drink.  If you drink alcohol: ? Limit how much you have to 0-2  drinks a day. ? Be aware of how much alcohol is in your drink. In the [place] one drink equals one 12 oz bottle of beer (355 mL), one 5 oz glass of wine (148 mL), or one 1 oz glass of hard liquor (44 mL). Lifestyle  Take daily care of your teeth and gums.  Stay active. Exercise for at least 30 minutes on 5 or  more days each week.  Do not use any products that contain nicotine or tobacco, such as cigarettes, e-cigarettes, and chewing tobacco. If you need help quitting, ask your health care provider.  If you are sexually active, practice safe sex. Use a condom or other form of protection to prevent STIs (sexually transmitted infections). What's next?  Go to your health care provider once a year for a well check visit.  Ask your health care provider how often you should have your eyes and teeth checked.  Stay up to date on all vaccines. This information is not intended to replace advice given to you by your health care provider. Make sure you discuss any questions you have with your health care provider. Document Released: 08/20/2001 Document Revised: 06/18/2018 Document Reviewed: 06/18/2018 Elsevier Patient Education  2020 [employer]  

## 2019-03-22 NOTE — Assessment & Plan Note (Signed)
ghm utd Check labs See [facility] Pt wants to wait on a flu shot

## 2019-03-22 NOTE — Assessment & Plan Note (Signed)
Pt requesting a counselor  Name and numbers given to pt to call

## 2019-03-22 NOTE — Assessment & Plan Note (Signed)
Recheck labs today. 

## 2019-03-22 NOTE — Progress Notes (Signed)
Patient ID: Tony Jennings, male    DOB: [birth year]  Age: 38 y.o. MRN: [id number]    Subjective:  Subjective  HPI THANG FLETT presents for cpe and labs    Pt is 2 years sober and is doing well  Pt c/o R sided abd pain for the last few weeks -- he is also burping more.    Review of Systems  Constitutional: Negative.  Negative for appetite change, diaphoresis, fatigue and unexpected weight change.  HENT: Negative for congestion, ear pain, hearing loss, nosebleeds, postnasal drip, rhinorrhea, sinus pressure, sneezing and tinnitus.   Eyes: Negative for photophobia, pain, discharge, redness, itching and visual disturbance.  Respiratory: Negative.  Negative for cough, chest tightness, shortness of breath and wheezing.   Cardiovascular: Negative.  Negative for chest pain, palpitations and leg swelling.  Gastrointestinal: Positive for abdominal pain. Negative for abdominal distention, anal bleeding, blood in stool and constipation.  Endocrine: Negative.  Negative for cold intolerance, heat intolerance, polydipsia, polyphagia and polyuria.  Genitourinary: Negative.  Negative for difficulty urinating, dysuria and frequency.  Musculoskeletal: Negative.   Skin: Negative.   Allergic/Immunologic: Negative.   Neurological: Negative for dizziness, weakness, light-headedness, numbness and headaches.  Psychiatric/Behavioral: Negative for agitation, confusion, decreased concentration, dysphoric mood, sleep disturbance and suicidal ideas. The patient is not nervous/anxious.     History Past Medical History:  Diagnosis Date   Acute hypokalemia    Acute hyponatremia    Alcohol abuse    Alcohol-induced chronic pancreatitis (HCC)    ALD (alcoholic liver disease) ([place])    Anemia 04/05/2017   Anxiety    Depression    Hepatic steatosis    Seizures (HCC)    Ulnar neuropathy at elbow 10/24/2014    He has a past surgical history that includes Wrist surgery.   His family history includes  Hypertension in his father and mother.He reports that he has been smoking cigarettes. He has a 2.50 pack-year smoking history. He has never used smokeless tobacco. He reports that he does not drink alcohol or use drugs.  Current Outpatient Medications on File Prior to Visit  Medication Sig Dispense Refill   folic acid (FOLVITE) 1 MG tablet Take 1 tablet (1 mg total) by mouth daily. 30 tablet 1   gabapentin (NEURONTIN) 100 MG capsule 1 po bid 180 capsule 2   Multiple Vitamin (MULTIVITAMIN WITH MINERALS) TABS tablet Take 1 tablet by mouth daily.     traZODone (DESYREL) 50 MG tablet PLEASE SEE ATTACHED FOR DETAILED DIRECTIONS 30 tablet 5   [DISCONTINUED] buPROPion (WELLBUTRIN SR) 100 MG 12 hr tablet Take 1 tablet (100 mg total) by mouth daily. (Patient not taking: Reported on 02/04/2017) 30 tablet 0   [DISCONTINUED] propranolol (INDERAL) 10 MG tablet Take 0.5 tablets (5 mg total) by mouth 2 (two) times daily. 30 tablet 0   [DISCONTINUED] sertraline (ZOLOFT) 25 MG tablet TAKE ONE TABLET BY MOUTH ONCE DAILY 83 tablet 0   No current facility-administered medications on file prior to visit.      Objective:  Objective  Physical Exam Vitals signs and nursing note reviewed.  Constitutional:      General: He is not in acute distress.    Appearance: He is well-developed. He is not diaphoretic.  HENT:     Head: Normocephalic and atraumatic.     Right Ear: External ear normal.     Left Ear: External ear normal.     Nose: Nose normal.     Mouth/Throat:     Pharynx:  No oropharyngeal exudate.  Eyes:     General:        Right eye: No discharge.        Left eye: No discharge.     Conjunctiva/sclera: Conjunctivae normal.     Pupils: Pupils are equal, round, and reactive to light.  Neck:     Musculoskeletal: Normal range of motion and neck supple.     Thyroid: No thyromegaly.     Vascular: No JVD.  Cardiovascular:     Rate and Rhythm: Normal rate and regular rhythm.     Heart sounds: No  murmur. No friction rub. No gallop.   Pulmonary:     Effort: Pulmonary effort is normal. No respiratory distress.     Breath sounds: Normal breath sounds. No wheezing or rales.  Chest:     Chest wall: No tenderness.  Abdominal:     General: Bowel sounds are normal. There is no distension.     Palpations: Abdomen is soft. There is no mass.     Tenderness: There is abdominal tenderness. There is no guarding or rebound.    Genitourinary:    Penis: Normal.      Prostate: Normal.     Rectum: Normal. Guaiac result negative.  Musculoskeletal: Normal range of motion.        General: No tenderness.  Lymphadenopathy:     Cervical: No cervical adenopathy.  Skin:    General: Skin is warm and dry.     Coloration: Skin is not pale.     Findings: No erythema or rash.  Neurological:     Mental Status: He is alert and oriented to person, place, and time.     Motor: No abnormal muscle tone.     Deep Tendon Reflexes: Reflexes are normal and symmetric. Reflexes normal.  Psychiatric:        Mood and Affect: Mood is anxious. Mood is not depressed.        Behavior: Behavior normal.        Thought Content: Thought content normal.        Judgment: Judgment normal.    BP 100/88 (BP Location: Left Arm, Patient Position: Sitting, Cuff Size: Normal)    Pulse 89    Temp 97.9 F (36.6 C) (Temporal)    Resp 18    Ht 5\' 9"  (1.753 m)    Wt 150 lb 3.2 oz (68.1 kg)    SpO2 97%    BMI 22.18 kg/m  Wt Readings from Last 3 Encounters:  03/22/19 150 lb 3.2 oz (68.1 kg)  08/14/17 155 lb (70.3 kg)  04/05/17 145 lb 8.1 oz (66 kg)     Lab Results  Component Value Date   WBC 4.5 04/09/2017   HGB 9.3 (L) 04/09/2017   HCT 30.5 (L) 04/09/2017   PLT 117 (L) 04/09/2017   GLUCOSE 103 (H) 04/09/2017   ALT 58 04/09/2017   AST 202 (H) 04/09/2017   NA 138 04/09/2017   K 3.2 (L) 04/09/2017   CL 107 04/09/2017   CREATININE 0.62 04/09/2017   BUN 5 (L) 04/09/2017   CO2 25 04/09/2017   INR 1.00 04/07/2017    Ct  Head Wo Contrast  Result Date: 04/05/2017 CLINICAL DATA:  38 year old male with acute altered mental status, dizziness and ataxia. EXAM: CT HEAD WITHOUT CONTRAST TECHNIQUE: Contiguous axial images were obtained from the base of the skull through the vertex without intravenous contrast. COMPARISON:  None. FINDINGS: Brain: No evidence of infarction, hemorrhage, hydrocephalus, extra-axial collection  or mass lesion/mass effect. Vascular: No hyperdense vessel or unexpected calcification. Skull: Normal. Negative for fracture or focal lesion. Sinuses/Orbits: No acute finding. Other: None. IMPRESSION: Unremarkable noncontrast head CT. Electronically Signed   By: Harmon PierJeffrey  Hu M.D.   On: 04/05/2017 09:35   Mr Brain Wo Contrast  Result Date: 04/05/2017 CLINICAL DATA:  Initial evaluation for acute ataxia, suspect stroke. EXAM: MRI HEAD WITHOUT CONTRAST TECHNIQUE: Multiplanar, multiecho pulse sequences of the brain and surrounding structures were obtained without intravenous contrast. COMPARISON:  Prior CT from earlier the same day. FINDINGS: Brain: Study degraded by motion artifact. Cerebral volume within normal limits. No obvious focal parenchymal signal abnormality. No abnormal foci of restricted diffusion to suggest acute or subacute ischemia. Gray-white matter differentiation maintained. No encephalomalacia to suggest chronic infarction. No evidence for acute or chronic intracranial hemorrhage. No mass lesion, midline shift or mass effect. No hydrocephalus. No extra-axial fluid collection. Major dural sinuses grossly patent. Pituitary gland suprasellar region normal. Midline structures intact and normal. Vascular: Major intracranial vascular flow voids grossly maintained. Skull and upper cervical spine: Craniocervical junction normal. Upper cervical spine within normal limits. Bone marrow signal intensity normal. No scalp soft tissue abnormality. Sinuses/Orbits: Globes and orbital soft tissues grossly within normal  limits. Paranasal sinuses are clear. No mastoid effusion. Other: None. IMPRESSION: Normal brain MRI.  No acute intracranial abnormality identified. Electronically Signed   By: Rise MuBenjamin  McClintock M.D.   On: 04/05/2017 15:12     Assessment & Plan:  Plan  I have discontinued Duane LopeJeffrey D. Kindt's LACTULOSE PO, MELATONIN PO, and escitalopram. I am also having him maintain his multivitamin with minerals, folic acid, traZODone, and gabapentin.  No orders of the defined types were placed in this encounter.   Problem List Items Addressed This Visit      Unprioritized   Generalized anxiety disorder    Pt requesting a counselor  Name and numbers given to pt to call       Hepatic steatosis    Recheck labs today      Preventative health care - Primary    ghm utd Check labs See aVS Pt wants to wait on a flu shot       Relevant Orders   Lipid panel   CBC with Differential/Platelet   TSH   Comprehensive metabolic panel   US Abdomen Complete   Right upper quadrant abdominal pain    Check us abd  Check labs          Follow-up: Return if symptoms worsen or fail to improve.  Donato SchultzYvonne R Lowne Chase, DO

## 2019-04-06 IMAGING — MR MR HEAD W/O CM
10 of 11 series · 43 of 48 positions shown · non-contrast
Comparison: Prior CT from earlier the same day.

CLINICAL DATA: Initial evaluation for acute ataxia, suspect stroke.

EXAM:
MRI HEAD WITHOUT CONTRAST
TECHNIQUE: Multiplanar, multiecho pulse sequences of the brain and surrounding
structures were obtained without intravenous contrast.

[Series 2: T1 · sagittal · 5.0mm · 0.45mm/px · 3 of 23 slices shown (1 of 2)]
[im 1/23]
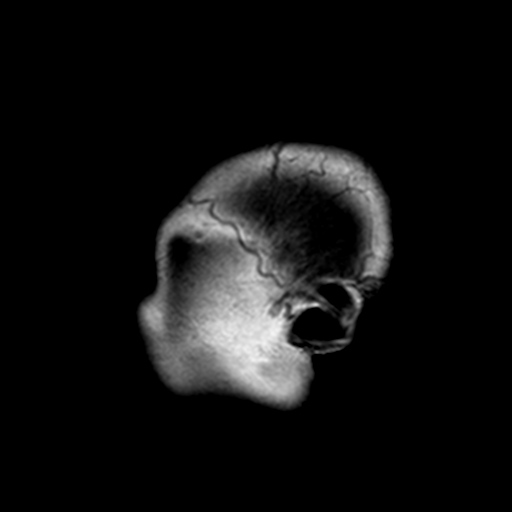
[im 12/23]
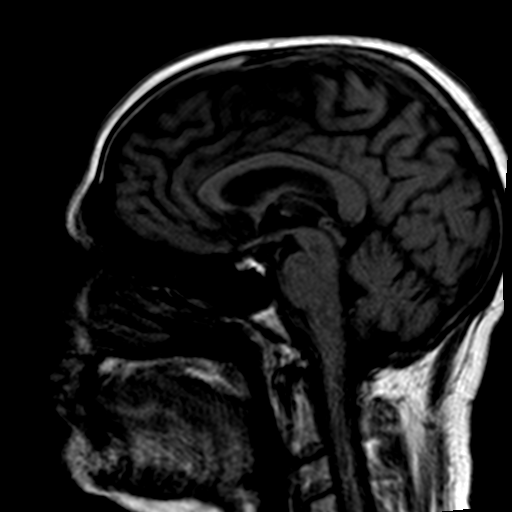
[im 23/23]
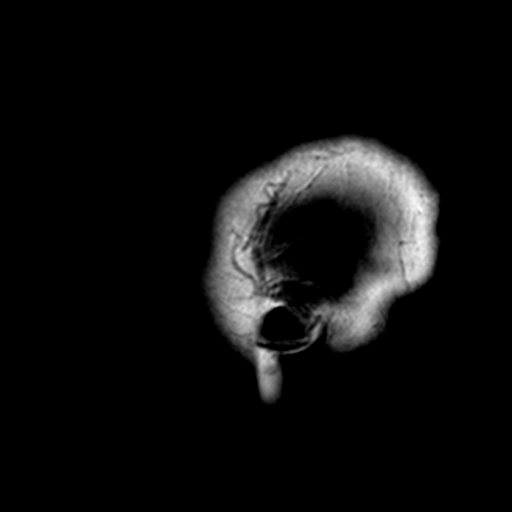

[Series 3: DWI · axial · 3.0mm · 2.19mm/px · z∈[-67,+87]mm · 9 of 96 slices shown (1 of 4)]
[im 1/96]
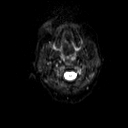
[im 12/96]
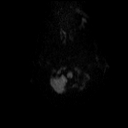
[im 24/96]
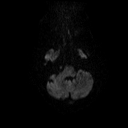
[im 36/96]
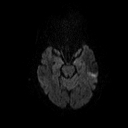
[im 48/96]
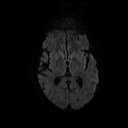
[im 60/96]
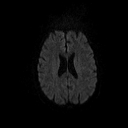
[im 72/96]
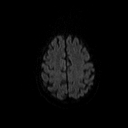
[im 84/96]
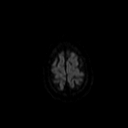
[im 96/96]
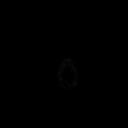

[Series 4: DWI · axial · 3.0mm · 2.19mm/px · z∈[-67,+87]mm · 4 of 48 slices shown (2 of 4)]
[im 1/48]
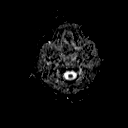
[im 16/48]
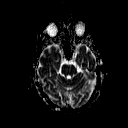
[im 32/48]
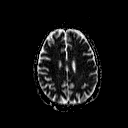
[im 48/48]
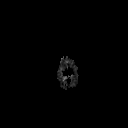

[Series 5: DWI · coronal · 3.0mm · 1.46mm/px · 9 of 100 slices shown (3 of 4)]
[im 1/100]
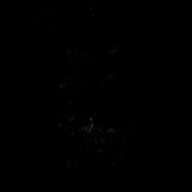
[im 13/100]
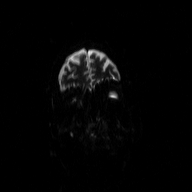
[im 25/100]
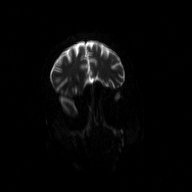
[im 38/100]
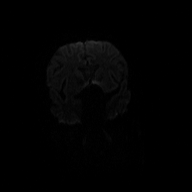
[im 50/100]
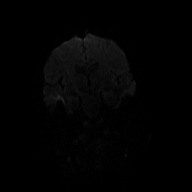
[im 62/100]
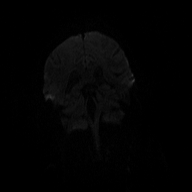
[im 75/100]
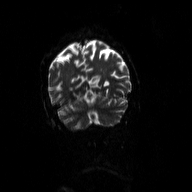
[im 87/100]
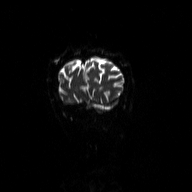
[im 100/100]
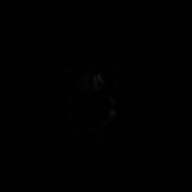

[Series 6: DWI · coronal · 3.0mm · 1.46mm/px · 5 of 50 slices shown (4 of 4)]
[im 1/50]
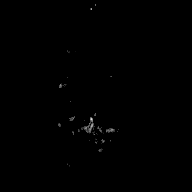
[im 13/50]
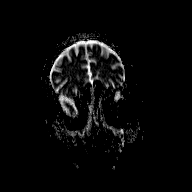
[im 25/50]
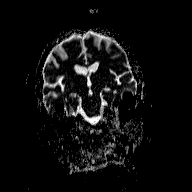
[im 37/50]
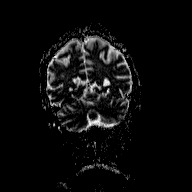
[im 50/50]
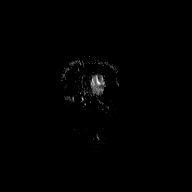

[Series 7: T2 · axial · 5.0mm · 0.47mm/px · z∈[-51,+116]mm · 2 of 25 slices shown (1 of 2)]
[im 1/25]
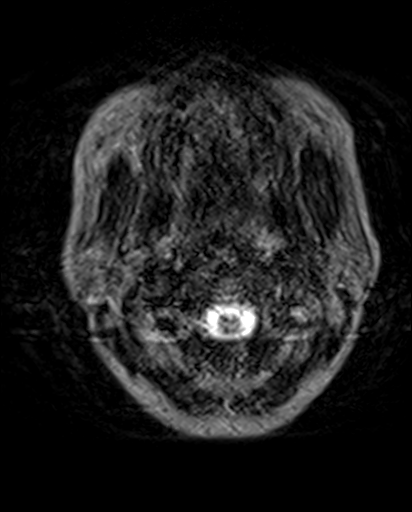
[im 25/25]
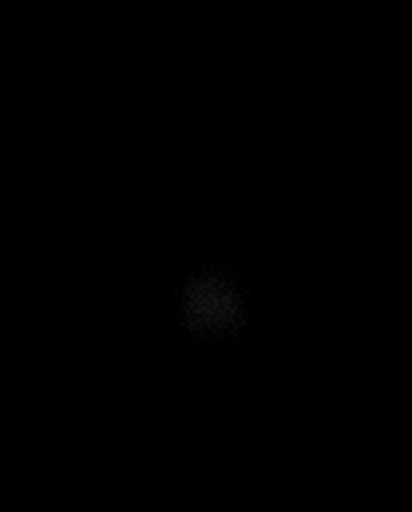

[Series 8: T2 · axial · 5.0mm · 0.47mm/px · z∈[-52,+115]mm · 2 of 25 slices shown (2 of 2)]
[im 1/25]
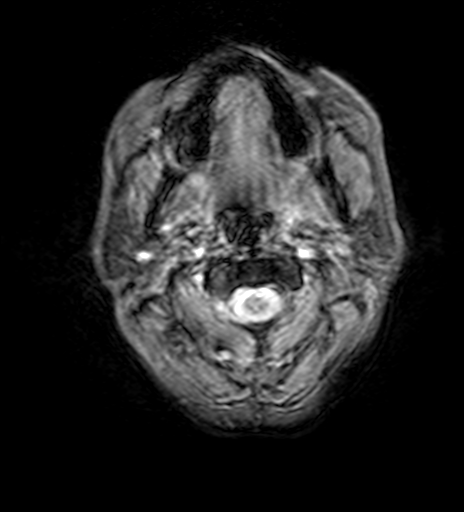
[im 25/25]
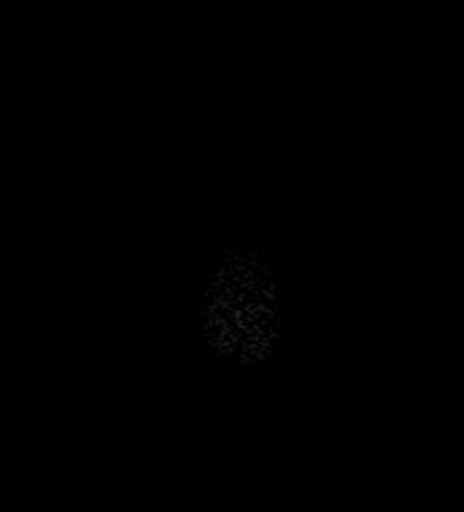

[Series 9: FLAIR · axial · 3.0mm · 0.45mm/px · z∈[-53,+112]mm · 4 of 42 slices shown]
[im 1/42]
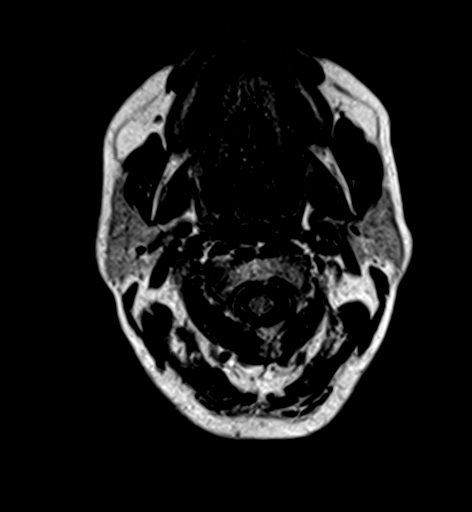
[im 14/42]
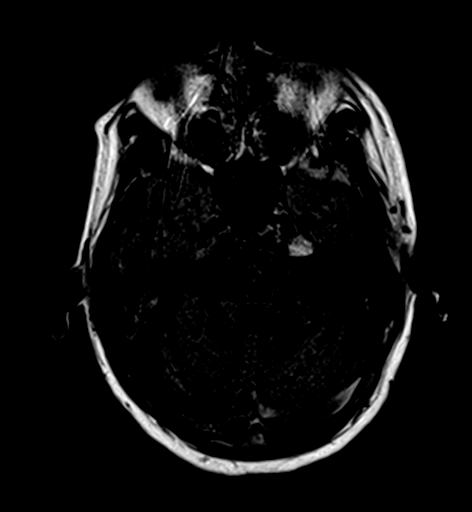
[im 28/42]
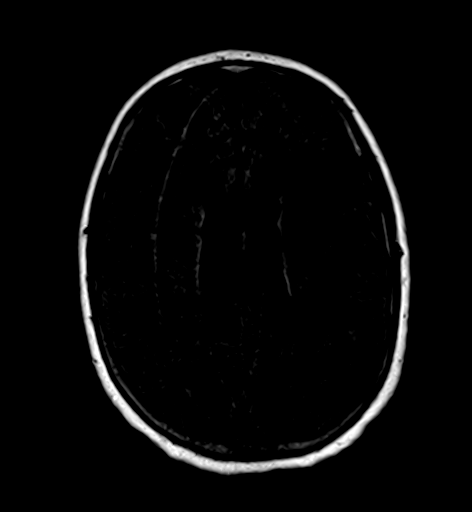
[im 42/42]
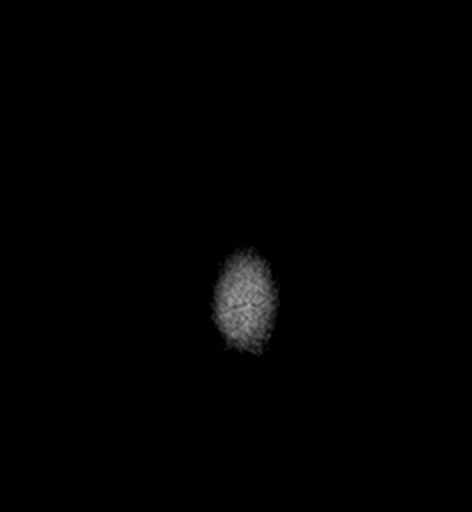

[Series 11: T2 post-contrast · coronal · 5.0mm · 0.45mm/px · 3 of 32 slices shown]
[im 1/32]
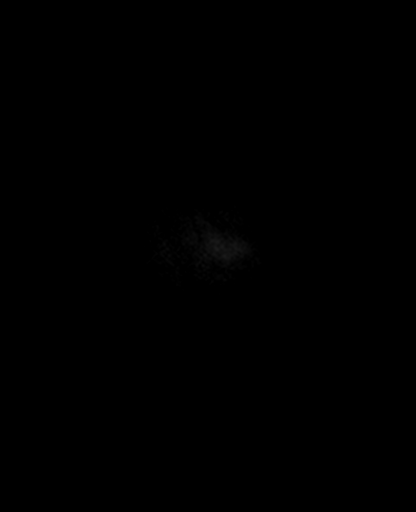
[im 16/32]
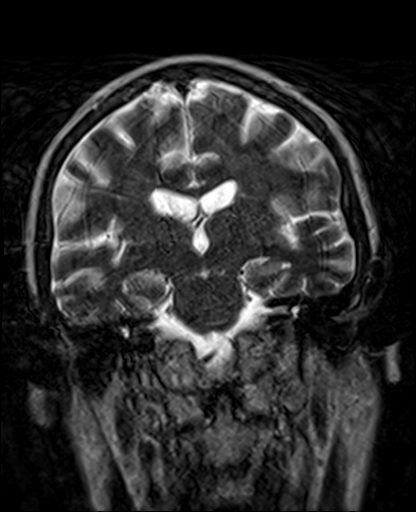
[im 32/32]
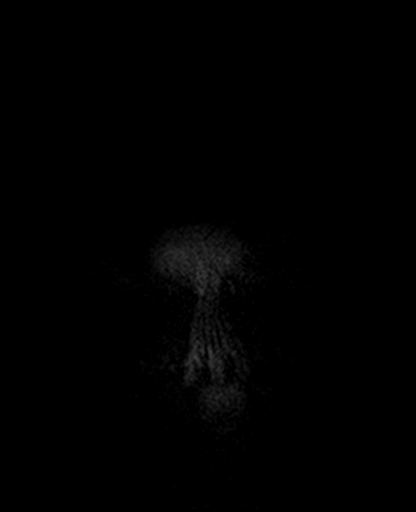

[Series 12: T1 · sagittal · 5.0mm · 0.47mm/px · 2 of 23 slices shown (2 of 2)]
[im 1/23]
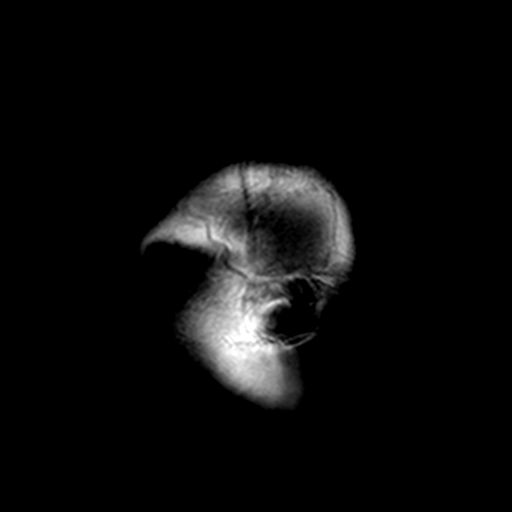
[im 23/23]
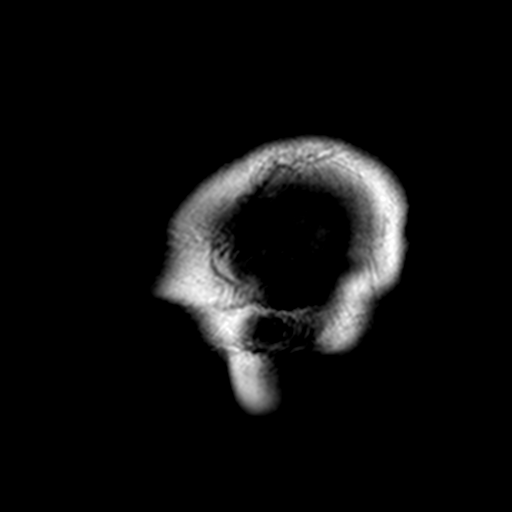

[43 of 48 positions shown; findings below may reference images not displayed]

FINDINGS: Brain: Study degraded by motion artifact.

Cerebral volume within normal limits. No obvious focal parenchymal
signal abnormality. No abnormal foci of restricted diffusion to
suggest acute or subacute ischemia. Gray-white matter
differentiation maintained. No encephalomalacia to suggest chronic
infarction. No evidence for acute or chronic intracranial
hemorrhage.

No mass lesion, midline shift or mass effect. No hydrocephalus. No
extra-axial fluid collection. Major dural sinuses grossly patent.

Pituitary gland suprasellar region normal. Midline structures intact
and normal.

Vascular: Major intracranial vascular flow voids grossly maintained.

Skull and upper cervical spine: Craniocervical junction normal.
Upper cervical spine within normal limits. Bone marrow signal
intensity normal. No scalp soft tissue abnormality.

Sinuses/Orbits: Globes and orbital soft tissues grossly within
normal limits. Paranasal sinuses are clear. No mastoid effusion.

Other: None.
IMPRESSION: Normal brain MRI.  No acute intracranial abnormality identified.

## 2019-06-16 ENCOUNTER — Encounter: Payer: Self-pay | Admitting: Family Medicine

## 2019-06-25 ENCOUNTER — Other Ambulatory Visit: Payer: Self-pay | Admitting: Family Medicine

## 2019-06-25 DIAGNOSIS — G4700 Insomnia, unspecified: Secondary | ICD-10-CM

## 2019-06-25 NOTE — Telephone Encounter (Signed)
Last OV 03/22/19 Last refill 12/18/18 #30/5 Next OV not scheduled

## 2019-07-18 ENCOUNTER — Other Ambulatory Visit: Payer: Self-pay | Admitting: Family Medicine

## 2019-07-18 DIAGNOSIS — G4700 Insomnia, unspecified: Secondary | ICD-10-CM

## 2019-10-25 ENCOUNTER — Other Ambulatory Visit: Payer: Self-pay | Admitting: Family Medicine

## 2019-10-25 DIAGNOSIS — F418 Other specified anxiety disorders: Secondary | ICD-10-CM

## 2020-04-21 ENCOUNTER — Other Ambulatory Visit: Payer: Self-pay | Admitting: Family Medicine

## 2020-04-21 DIAGNOSIS — G4700 Insomnia, unspecified: Secondary | ICD-10-CM

## 2020-08-08 ENCOUNTER — Other Ambulatory Visit: Payer: Self-pay

## 2020-08-08 ENCOUNTER — Encounter: Payer: Self-pay | Admitting: Family Medicine

## 2020-08-08 ENCOUNTER — Ambulatory Visit (INDEPENDENT_AMBULATORY_CARE_PROVIDER_SITE_OTHER): Payer: BC Managed Care – PPO | Admitting: Family Medicine

## 2020-08-08 VITALS — BP 100/70 | HR 76 | Temp 98.3°F | Resp 18 | Ht 69.0 in | Wt 150.0 lb

## 2020-08-08 DIAGNOSIS — Z125 Encounter for screening for malignant neoplasm of prostate: Secondary | ICD-10-CM | POA: Diagnosis not present

## 2020-08-08 DIAGNOSIS — Z0000 Encounter for general adult medical examination without abnormal findings: Secondary | ICD-10-CM

## 2020-08-08 NOTE — Assessment & Plan Note (Signed)
Pt will get flu shot over the weekend ghm utd Check labs  See avs Pt has had all 3 covid vaccines

## 2020-08-08 NOTE — Patient Instructions (Signed)

## 2020-08-08 NOTE — Progress Notes (Signed)
Patient ID: Tony Jennings, male    DOB: 1981/01/20  Age: 40 y.o. MRN: [id number]    Subjective:  Subjective  HPI Tony Jennings presents for cpe and his only complaints is constipation   It is not terrible - just started   Review of Systems  Constitutional: Negative.   HENT: Negative for congestion, ear pain, hearing loss, nosebleeds, postnasal drip, rhinorrhea, sinus pressure, sneezing and tinnitus.   Eyes: Negative for photophobia, discharge, itching and visual disturbance.  Respiratory: Negative.   Cardiovascular: Negative.   Gastrointestinal: Negative for abdominal distention, abdominal pain, anal bleeding, blood in stool and constipation.  Endocrine: Negative.   Genitourinary: Negative.   Musculoskeletal: Negative.   Skin: Negative.   Allergic/Immunologic: Negative.   Neurological: Negative for dizziness, weakness, light-headedness, numbness and headaches.  Psychiatric/Behavioral: Negative for agitation, confusion, decreased concentration, dysphoric mood, sleep disturbance and suicidal ideas. The patient is not nervous/anxious.     History Past Medical History:  Diagnosis Date  . Acute hypokalemia   . Acute hyponatremia   . Alcohol abuse   . Alcohol-induced chronic pancreatitis (HCC)   . ALD (alcoholic liver disease) (HCC)   . Anemia 04/05/2017  . Anxiety   . Depression   . Hepatic steatosis   . Seizures (HCC)   . Ulnar neuropathy at elbow 10/24/2014    He has a past surgical history that includes Wrist surgery.   His family history includes Hypertension in his father and mother.He reports that he quit smoking about 12 months ago. His smoking use included cigarettes. He has a 2.50 pack-year smoking history. He has never used smokeless tobacco. He reports that he does not drink alcohol and does not use drugs.  Current Outpatient Medications on File Prior to Visit  Medication Sig Dispense Refill  . folic acid (FOLVITE) 1 MG tablet Take 1 tablet (1 mg total) by mouth  daily. 30 tablet 1  . gabapentin (NEURONTIN) 100 MG capsule Take 1 capsule (100 mg total) by mouth 2 (two) times daily. (Patient taking differently: Take 100 mg by mouth daily.) 180 capsule 0  . Multiple Vitamin (MULTIVITAMIN WITH MINERALS) TABS tablet Take 1 tablet by mouth daily.    . traZODone (DESYREL) 50 MG tablet TAKE 1 TABLET BY MOUTH AT BEDTIME OR AS DIRECTED 90 tablet 2  . [DISCONTINUED] buPROPion (WELLBUTRIN SR) 100 MG 12 hr tablet Take 1 tablet (100 mg total) by mouth daily. (Patient not taking: Reported on 02/04/2017) 30 tablet 0  . [DISCONTINUED] propranolol (INDERAL) 10 MG tablet Take 0.5 tablets (5 mg total) by mouth 2 (two) times daily. 30 tablet 0  . [DISCONTINUED] sertraline (ZOLOFT) 25 MG tablet TAKE ONE TABLET BY MOUTH ONCE DAILY 83 tablet 0   No current facility-administered medications on file prior to visit.   Health Maintenance  Topic Date Due  . INFLUENZA VACCINE  10/05/2020 (Originally 02/06/2020)  . TETANUS/TDAP  02/27/2026  . COVID-19 Vaccine  Completed  . Hepatitis C Screening  Completed  . HIV Screening  Completed     Objective:  Objective  Physical Exam Vitals and nursing note reviewed.  Constitutional:      General: He is sleeping. He is not in acute distress.Vital signs are normal.     Appearance: He is well-developed and well-nourished. He is not diaphoretic.  HENT:     Head: Normocephalic and atraumatic.     Right Ear: External ear normal.     Left Ear: External ear normal.     Nose: Nose normal.  Mouth/Throat:     Mouth: Oropharynx is clear and moist.     Pharynx: No oropharyngeal exudate.  Eyes:     General:        Right eye: No discharge.        Left eye: No discharge.     Extraocular Movements: EOM normal.     Conjunctiva/sclera: Conjunctivae normal.     Pupils: Pupils are equal, round, and reactive to light.  Neck:     Thyroid: No thyromegaly.     Vascular: No JVD.  Cardiovascular:     Rate and Rhythm: Normal rate and regular  rhythm.     Pulses: Intact distal pulses.     Heart sounds: No murmur heard. No friction rub. No gallop.   Pulmonary:     Effort: Pulmonary effort is normal. No respiratory distress.     Breath sounds: Normal breath sounds. No wheezing or rales.  Chest:     Chest wall: No tenderness.  Abdominal:     General: Bowel sounds are normal. There is no distension.     Palpations: Abdomen is soft. There is no mass.     Tenderness: There is no abdominal tenderness. There is no guarding or rebound.  Genitourinary:    Comments: Pt wants to wait until next year Musculoskeletal:        General: No tenderness or edema. Normal range of motion.     Cervical back: Normal range of motion and neck supple.  Lymphadenopathy:     Cervical: No cervical adenopathy.  Skin:    General: Skin is warm and dry.     Coloration: Skin is not pale.     Findings: No erythema or rash.  Neurological:     Mental Status: He is oriented to person, place, and time.     Motor: No abnormal muscle tone.     Deep Tendon Reflexes: Reflexes are normal and symmetric. Reflexes normal.  Psychiatric:        Mood and Affect: Mood and affect normal.        Behavior: Behavior normal.        Thought Content: Thought content normal.        Judgment: Judgment normal.    BP 100/70 (BP Location: Right Arm, Patient Position: Sitting, Cuff Size: Normal)   Pulse 76   Temp 98.3 F (36.8 C) (Oral)   Resp 18   Ht 5\' 9"  (1.753 m)   Wt 150 lb (68 kg)   SpO2 98%   BMI 22.15 kg/m  Wt Readings from Last 3 Encounters:  08/08/20 150 lb (68 kg)  03/22/19 150 lb 3.2 oz (68.1 kg)  08/14/17 155 lb (70.3 kg)     Lab Results  Component Value Date   WBC 7.0 03/22/2019   HGB 16.1 03/22/2019   HCT 48.0 03/22/2019   PLT 134.0 (L) 03/22/2019   GLUCOSE 86 03/22/2019   CHOL 160 03/22/2019   TRIG 94.0 03/22/2019   HDL 37.90 (L) 03/22/2019   LDLCALC 103 (H) 03/22/2019   ALT 16 03/22/2019   AST 16 03/22/2019   NA 141 03/22/2019   K 4.4  03/22/2019   CL 103 03/22/2019   CREATININE 0.78 03/22/2019   BUN 12 03/22/2019   CO2 29 03/22/2019   TSH 1.05 03/22/2019   INR 1.00 04/07/2017    CT Head Wo Contrast  Result Date: 04/05/2017 CLINICAL DATA:  40 year old male with acute altered mental status, dizziness and ataxia. EXAM: CT HEAD WITHOUT CONTRAST TECHNIQUE: Contiguous  axial images were obtained from the base of the skull through the vertex without intravenous contrast. COMPARISON:  None. FINDINGS: Brain: No evidence of infarction, hemorrhage, hydrocephalus, extra-axial collection or mass lesion/mass effect. Vascular: No hyperdense vessel or unexpected calcification. Skull: Normal. Negative for fracture or focal lesion. Sinuses/Orbits: No acute finding. Other: None. IMPRESSION: Unremarkable noncontrast head CT. Electronically Signed   By: Harmon Pier M.D.   On: 04/05/2017 09:35   MR Brain Wo Contrast  Result Date: 04/05/2017 CLINICAL DATA:  Initial evaluation for acute ataxia, suspect stroke. EXAM: MRI HEAD WITHOUT CONTRAST TECHNIQUE: Multiplanar, multiecho pulse sequences of the brain and surrounding structures were obtained without intravenous contrast. COMPARISON:  Prior CT from earlier the same day. FINDINGS: Brain: Study degraded by motion artifact. Cerebral volume within normal limits. No obvious focal parenchymal signal abnormality. No abnormal foci of restricted diffusion to suggest acute or subacute ischemia. Gray-white matter differentiation maintained. No encephalomalacia to suggest chronic infarction. No evidence for acute or chronic intracranial hemorrhage. No mass lesion, midline shift or mass effect. No hydrocephalus. No extra-axial fluid collection. Major dural sinuses grossly patent. Pituitary gland suprasellar region normal. Midline structures intact and normal. Vascular: Major intracranial vascular flow voids grossly maintained. Skull and upper cervical spine: Craniocervical junction normal. Upper cervical spine within  normal limits. Bone marrow signal intensity normal. No scalp soft tissue abnormality. Sinuses/Orbits: Globes and orbital soft tissues grossly within normal limits. Paranasal sinuses are clear. No mastoid effusion. Other: None. IMPRESSION: Normal brain MRI.  No acute intracranial abnormality identified. Electronically Signed   By: Rise Mu M.D.   On: 04/05/2017 15:12     Assessment & Plan:  Plan  I am having Tony Jennings maintain his multivitamin with minerals, folic acid, gabapentin, and traZODone.  No orders of the defined types were placed in this encounter.   Problem List Items Addressed This Visit      Unprioritized   Preventative health care - Primary    Pt will get flu shot over the weekend ghm utd Check labs  See avs Pt has had all 3 covid vaccines        Relevant Orders   Lipid panel   CBC with Differential/Platelet   TSH   Comprehensive metabolic panel   PSA      Follow-up: Return in about 1 year (around 08/08/2021), or if symptoms worsen or fail to improve, for annual exam, fasting.  Donato Schultz, DO

## 2020-08-09 LAB — COMPREHENSIVE METABOLIC PANEL
ALT: 15 U/L (ref 0–53)
AST: 20 U/L (ref 0–37)
Albumin: 4.7 g/dL (ref 3.5–5.2)
Alkaline Phosphatase: 40 U/L (ref 39–117)
BUN: 12 mg/dL (ref 6–23)
CO2: 32 mEq/L (ref 19–32)
Calcium: 9.6 mg/dL (ref 8.4–10.5)
Chloride: 103 mEq/L (ref 96–112)
Creatinine, Ser: 0.81 mg/dL (ref 0.40–1.50)
GFR: 109.99 mL/min (ref >60.00)
Glucose, Bld: 64 mg/dL — ABNORMAL LOW (ref 70–99)
Potassium: 4.1 mEq/L (ref 3.5–5.1)
Sodium: 139 mEq/L (ref 135–145)
Total Bilirubin: 0.6 mg/dL (ref 0.2–1.2)
Total Protein: 6.9 g/dL (ref 6.0–8.3)

## 2020-08-09 LAB — CBC WITH DIFFERENTIAL/PLATELET
Basophils Absolute: 0.1 10*3/uL (ref 0.0–0.1)
Basophils Relative: 0.9 % (ref 0.0–3.0)
Eosinophils Absolute: 0.2 10*3/uL (ref 0.0–0.7)
Eosinophils Relative: 3.4 % (ref 0.0–5.0)
HCT: 45.9 % (ref 39.0–52.0)
Hemoglobin: 15.5 g/dL (ref 13.0–17.0)
Lymphocytes Relative: 34.1 % (ref 12.0–46.0)
Lymphs Abs: 1.8 10*3/uL (ref 0.7–4.0)
MCHC: 33.7 g/dL (ref 30.0–36.0)
MCV: 84.4 fl (ref 78.0–100.0)
Monocytes Absolute: 0.3 10*3/uL (ref 0.1–1.0)
Monocytes Relative: 5.3 % (ref 3.0–12.0)
Neutro Abs: 3 10*3/uL (ref 1.4–7.7)
Neutrophils Relative %: 56.3 % (ref 43.0–77.0)
Platelets: 148 10*3/uL — ABNORMAL LOW (ref 150.0–400.0)
RBC: 5.44 Mil/uL (ref 4.22–5.81)
RDW: 13.3 % (ref 11.5–15.5)
WBC: 5.4 10*3/uL (ref 4.0–10.5)

## 2020-08-09 LAB — PSA: PSA: 0.23 ng/mL (ref 0.10–4.00)

## 2020-08-09 LAB — LIPID PANEL
Cholesterol: 144 mg/dL (ref 0–200)
HDL: 38.9 mg/dL — ABNORMAL LOW (ref >39.00)
LDL Cholesterol: 85 mg/dL (ref 0–99)
NonHDL: 104.8
Total CHOL/HDL Ratio: 4
Triglycerides: 98 mg/dL (ref 0.0–149.0)
VLDL: 19.6 mg/dL (ref 0.0–40.0)

## 2020-08-09 LAB — TSH: TSH: 1.19 u[IU]/mL (ref 0.35–4.50)

## 2021-01-07 ENCOUNTER — Emergency Department (INDEPENDENT_AMBULATORY_CARE_PROVIDER_SITE_OTHER): Payer: BC Managed Care – PPO

## 2021-01-07 ENCOUNTER — Emergency Department
Admission: EM | Admit: 2021-01-07 | Discharge: 2021-01-07 | Disposition: A | Discharge status: Home / Self Care | Payer: BC Managed Care – PPO | Source: Home / Self Care

## 2021-01-07 ENCOUNTER — Other Ambulatory Visit: Payer: Self-pay

## 2021-01-07 DIAGNOSIS — J029 Acute pharyngitis, unspecified: Secondary | ICD-10-CM | POA: Diagnosis not present

## 2021-01-07 DIAGNOSIS — R0789 Other chest pain: Secondary | ICD-10-CM | POA: Diagnosis not present

## 2021-01-07 NOTE — ED Triage Notes (Addendum)
Pt presents to Urgent Care with c/o sore throat/throat "tightness" and sob and chest "tightness." Also reports mild dry cough. Pt states he feels anxious and has a hx of panic attacks. Has not done home COVID test; pt is vaccinated.

## 2021-01-07 NOTE — ED Notes (Addendum)
Pt c/o chest "tightness" and being "a little sob."Also c/o sore throat. Denies nausea and radiating pain. VSS. Returned to waiting area.

## 2021-01-07 NOTE — Discharge Instructions (Addendum)
Advised patient may use 600 mg ibuprofen daily, as needed.  Advised if chest discomfort persist to follow-up with PCP for further evaluation.  Chest x-ray ordered per his request which is clear.
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
## 2021-02-04 ENCOUNTER — Other Ambulatory Visit: Payer: Self-pay | Admitting: Family Medicine

## 2021-02-04 DIAGNOSIS — G4700 Insomnia, unspecified: Secondary | ICD-10-CM

## 2021-02-05 ENCOUNTER — Other Ambulatory Visit: Payer: Self-pay | Admitting: Family Medicine

## 2021-02-05 DIAGNOSIS — F418 Other specified anxiety disorders: Secondary | ICD-10-CM

## 2021-07-17 ENCOUNTER — Ambulatory Visit: Payer: BC Managed Care – PPO | Admitting: Family Medicine

## 2021-07-17 ENCOUNTER — Encounter: Payer: Self-pay | Admitting: Family Medicine

## 2021-07-17 VITALS — BP 98/80 | HR 72 | Temp 98.1°F | Resp 18 | Ht 69.0 in | Wt 151.2 lb

## 2021-07-17 DIAGNOSIS — R1011 Right upper quadrant pain: Secondary | ICD-10-CM | POA: Diagnosis not present

## 2021-07-17 DIAGNOSIS — K219 Gastro-esophageal reflux disease without esophagitis: Secondary | ICD-10-CM

## 2021-07-17 DIAGNOSIS — R198 Other specified symptoms and signs involving the digestive system and abdomen: Secondary | ICD-10-CM | POA: Insufficient documentation

## 2021-07-17 DIAGNOSIS — R1013 Epigastric pain: Secondary | ICD-10-CM

## 2021-07-17 HISTORY — DX: Gastro-esophageal reflux disease without esophagitis: K21.9

## 2021-07-17 MED ORDER — PANTOPRAZOLE SODIUM 40 MG PO TBEC: 40.0000 mg | DELAYED_RELEASE_TABLET | Freq: Every day | ORAL | 3 refills | Status: DC

## 2021-07-17 NOTE — Progress Notes (Signed)
Subjective:   By signing my name below, I, [name], attest that this documentation has been prepared under the direction and in the presence of Dr. Seabron Spates, DO. 07/17/2020      Patient ID: Tony Jennings, male    DOB: April 07, 1981, 41 y.o.   MRN: [id number]  Chief Complaint  Patient presents with   Abdominal Pain    Pt states burping constantly and bloated and states more cramping than pain. No nausea or vomiting. Pt states both constipation and diarrhea and has not noticed blood in stools. Pt states no changes in appetite.     Abdominal Pain Associated symptoms include constipation and diarrhea. Pertinent negatives include no dysuria, fever, frequency, headaches or nausea.  Patient is in today for a office visit.  He reports having Covid-19 in September, 2022. After recovering from Covid-19 he started belching frequently and found it did not improve. He later developed dull discomfort in his upper right abdomen. He continues having discomfort in his upper right abdomen and frequent belching. He tried taking Pepcid to manage his symptoms and found no change in his symptoms. He also tried taking Tums and found mild relief. He continues taking it PRN when his symptoms worsen. He does not find anything in his diet worsening or improving his symptoms.  He is also complaining of episodes of constipation and diarrhea every other day. He is also episodes of small pellet stools.    Past Medical History:  Diagnosis Date   Acute hypokalemia    Acute hyponatremia    Alcohol abuse    Alcohol-induced chronic pancreatitis (HCC)    ALD (alcoholic liver disease) (HCC)    Anemia 04/05/2017   Anxiety    Depression    Hepatic steatosis    Seizures (HCC)    Ulnar neuropathy at elbow 10/24/2014    Past Surgical History:  Procedure Laterality Date   WRIST SURGERY      Family History  Problem Relation Age of Onset   Hypertension Mother    Hypertension Father     Social  History   Socioeconomic History   Marital status: Significant Other    Spouse name: Not on file   Number of children: Not on file   Years of education: Not on file   Highest education level: Not on file  Occupational History   Occupation: Risk analyst  Tobacco Use   Smoking status: Former    Packs/day: 0.25    Years: 10.00    Pack years: 2.50    Types: Cigarettes    Quit date: 07/18/2019    Years since quitting: 2.0   Smokeless tobacco: Never  Vaping Use   Vaping Use: Never used  Substance and Sexual Activity   Alcohol use: No    Comment: prior alcohol abuse   Drug use: No    Comment: prior use marijuana, snorted ritalin, rare cocain. (But this was when 41 yo)   Sexual activity: Yes    Partners: Female  Other Topics Concern   Not on file  Social History Narrative   Not on file   Social Determinants of Health   Financial Resource Strain: Not on file  Food Insecurity: Not on file  Transportation Needs: Not on file  Physical Activity: Not on file  Stress: Not on file  Social Connections: Not on file  Intimate Partner Violence: Not on file    Outpatient Medications Prior to Visit  Medication Sig Dispense Refill   folic acid (FOLVITE) 1 MG  tablet Take 1 tablet (1 mg total) by mouth daily. 30 tablet 1   gabapentin (NEURONTIN) 100 MG capsule TAKE 1 CAPSULE BY MOUTH TWICE A DAY 180 capsule 0   Multiple Vitamin (MULTIVITAMIN WITH MINERALS) TABS tablet Take 1 tablet by mouth daily.     traZODone (DESYREL) 50 MG tablet TAKE 1 TABLET BY MOUTH AT BEDTIME OR AS DIRECTED 90 tablet 2   No facility-administered medications prior to visit.    Allergies  Allergen Reactions   Citalopram Other (See Comments)    Tremors    Review of Systems  Constitutional:  Negative for fever and malaise/fatigue.  HENT:  Negative for congestion.   Eyes:  Negative for blurred vision.  Respiratory:  Negative for cough and shortness of breath.   Cardiovascular:  Negative for chest pain,  palpitations and leg swelling.  Gastrointestinal:  Positive for abdominal pain (upper right abdomen), constipation and diarrhea. Negative for blood in stool and nausea.       (+)frequent belching  Genitourinary:  Negative for dysuria and frequency.  Musculoskeletal:  Negative for falls.  Skin:  Negative for rash.  Neurological:  Negative for dizziness, loss of consciousness and headaches.  Endo/Heme/Allergies:  Negative for environmental allergies.  Psychiatric/Behavioral:  Negative for depression. The patient is not nervous/anxious.       Objective:    Physical Exam Vitals and nursing note reviewed.  Constitutional:      General: He is not in acute distress.    Appearance: Normal appearance. He is not ill-appearing.  HENT:     Head: Normocephalic and atraumatic.     Right Ear: External ear normal.     Left Ear: External ear normal.  Eyes:     Extraocular Movements: Extraocular movements intact.     Pupils: Pupils are equal, round, and reactive to light.  Cardiovascular:     Rate and Rhythm: Normal rate and regular rhythm.     Heart sounds: Normal heart sounds. No murmur heard.   No gallop.  Pulmonary:     Effort: Pulmonary effort is normal. No respiratory distress.     Breath sounds: Normal breath sounds. No wheezing or rales.  Abdominal:     General: Abdomen is flat. Bowel sounds are normal.     Palpations: Abdomen is soft.     Tenderness: There is no abdominal tenderness. There is no guarding or rebound. Negative signs include Murphy's sign and McBurney's sign.  Skin:    General: Skin is warm and dry.  Neurological:     Mental Status: He is alert and oriented to person, place, and time.  Psychiatric:        Behavior: Behavior normal.    BP 98/80 (BP Location: Right Arm, Patient Position: Sitting, Cuff Size: Normal)    Pulse 72    Temp 98.1 F (36.7 C) (Oral)    Resp 18    Ht 5\' 9"  (1.753 m)    Wt 151 lb 3.2 oz (68.6 kg)    SpO2 96%    BMI 22.33 kg/m  Wt Readings from  Last 3 Encounters:  07/17/21 151 lb 3.2 oz (68.6 kg)  08/08/20 150 lb (68 kg)  03/22/19 150 lb 3.2 oz (68.1 kg)    Diabetic Foot Exam - Simple   No data filed    Lab Results  Component Value Date   WBC 5.4 08/08/2020   HGB 15.5 08/08/2020   HCT 45.9 08/08/2020   PLT 148.0 (L) 08/08/2020   GLUCOSE 64 (L) 08/08/2020  CHOL 144 08/08/2020   TRIG 98.0 08/08/2020   HDL 38.90 (L) 08/08/2020   LDLCALC 85 08/08/2020   ALT 15 08/08/2020   AST 20 08/08/2020   NA 139 08/08/2020   K 4.1 08/08/2020   CL 103 08/08/2020   CREATININE 0.81 08/08/2020   BUN 12 08/08/2020   CO2 32 08/08/2020   TSH 1.19 08/08/2020   PSA 0.23 08/08/2020   INR 1.00 04/07/2017    Lab Results  Component Value Date   TSH 1.19 08/08/2020   Lab Results  Component Value Date   WBC 5.4 08/08/2020   HGB 15.5 08/08/2020   HCT 45.9 08/08/2020   MCV 84.4 08/08/2020   PLT 148.0 (L) 08/08/2020   Lab Results  Component Value Date   NA 139 08/08/2020   K 4.1 08/08/2020   CO2 32 08/08/2020   GLUCOSE 64 (L) 08/08/2020   BUN 12 08/08/2020   CREATININE 0.81 08/08/2020   BILITOT 0.6 08/08/2020   ALKPHOS 40 08/08/2020   AST 20 08/08/2020   ALT 15 08/08/2020   PROT 6.9 08/08/2020   ALBUMIN 4.7 08/08/2020   CALCIUM 9.6 08/08/2020   ANIONGAP 6 04/09/2017   GFR 109.99 08/08/2020   Lab Results  Component Value Date   CHOL 144 08/08/2020   Lab Results  Component Value Date   HDL 38.90 (L) 08/08/2020   Lab Results  Component Value Date   LDLCALC 85 08/08/2020   Lab Results  Component Value Date   TRIG 98.0 08/08/2020   Lab Results  Component Value Date   CHOLHDL 4 08/08/2020   No results found for: HGBA1C     Assessment & Plan:   Problem List Items Addressed This Visit       Unprioritized   Alternating constipation and diarrhea    Since this has been going on since September  Refer to GI      Dyspepsia - Primary    Pantoprazole daily  Check labs  Refer to GI      Relevant  Medications   pantoprazole (PROTONIX) 40 MG tablet   Other Relevant Orders   CBC with Differential/Platelet   Comprehensive metabolic panel   H. pylori antibody, IgG   Amylase   Lipase   Ambulatory referral to Gastroenterology   Right upper quadrant abdominal pain   Relevant Orders   CBC with Differential/Platelet   Comprehensive metabolic panel   H. pylori antibody, IgG   Amylase   Lipase   Ambulatory referral to Gastroenterology     Meds ordered this encounter  Medications   pantoprazole (PROTONIX) 40 MG tablet    Sig: Take 1 tablet (40 mg total) by mouth daily.    Dispense:  30 tablet    Refill:  3    I, Dr. Seabron Spates, DO, personally preformed the services described in this documentation.  All medical record entries made by the scribe were at my direction and in my presence.  I have reviewed the chart and discharge instructions (if applicable) and agree that the record reflects my personal performance and is accurate and complete. 07/17/2021   I,[name],acting as a scribe for Donato Schultz, DO.,have documented all relevant documentation on the behalf of Donato Schultz, DO,as directed by  Donato Schultz, DO while in the presence of Donato Schultz, DO.   Donato Schultz, DO

## 2021-07-17 NOTE — Patient Instructions (Signed)
Food Choices for Gastroesophageal Reflux Disease, Adult °When you have gastroesophageal reflux disease (GERD), the foods you eat and your eating habits are very important. Choosing the right foods can help ease the discomfort of GERD. Consider working with a dietitian to help you make healthy food choices. °What are tips for following this plan? °Reading food labels °Look for foods that are low in saturated fat. Foods that have less than 5% of daily value (DV) of fat and 0 g of trans fats may help with your symptoms. °Cooking °Cook foods using methods other than frying. This may include baking, steaming, grilling, or broiling. These are all methods that do not need a lot of fat for cooking. °To add flavor, try to use herbs that are low in spice and acidity. °Meal planning ° °Choose healthy foods that are low in fat, such as fruits, vegetables, whole grains, low-fat dairy products, lean meats, fish, and poultry. °Eat frequent, small meals instead of three large meals each day. Eat your meals slowly, in a relaxed setting. Avoid bending over or lying down until 2-3 hours after eating. °Limit high-fat foods such as fatty meats or fried foods. °Limit your intake of fatty foods, such as oils, butter, and shortening. °Avoid the following as told by your health care provider: °Foods that cause symptoms. These may be different for different people. Keep a food diary to keep track of foods that cause symptoms. °Alcohol. °Drinking large amounts of liquid with meals. °Eating meals during the 2-3 hours before bed. °Lifestyle °Maintain a healthy weight. Ask your health care provider what weight is healthy for you. If you need to lose weight, work with your health care provider to do so safely. °Exercise for at least 30 minutes on 5 or more days each week, or as told by your health care provider. °Avoid wearing clothes that fit tightly around your waist and chest. °Do not use any products that contain nicotine or tobacco. These  products include cigarettes, chewing tobacco, and vaping devices, such as e-cigarettes. If you need help quitting, ask your health care provider. °Sleep with the head of your bed raised. Use a wedge under the mattress or blocks under the bed frame to raise the head of the bed. °Chew sugar-free gum after mealtimes. °What foods should I eat? °Eat a healthy, well-balanced diet of fruits, vegetables, whole grains, low-fat dairy products, lean meats, fish, and poultry. Each person is different. Foods that may trigger symptoms in one person may not trigger any symptoms in another person. Work with your health care provider to identify foods that are safe for you. °The items listed above may not be a complete list of recommended foods and beverages. Contact a dietitian for more information. °What foods should I avoid? °Limiting some of these foods may help manage the symptoms of GERD. Everyone is different. Consult a dietitian or your health care provider to help you identify the exact foods to avoid, if any. °Fruits °Any fruits prepared with added fat. Any fruits that cause symptoms. For some people this may include citrus fruits, such as oranges, grapefruit, pineapple, and lemons. °Vegetables °Deep-fried vegetables. French fries. Any vegetables prepared with added fat. Any vegetables that cause symptoms. For some people, this may include tomatoes and tomato products, chili peppers, onions and garlic, and horseradish. °Grains °Pastries or quick breads with added fat. °Meats and other proteins °High-fat meats, such as fatty beef or pork, hot dogs, ribs, ham, sausage, salami, and bacon. Fried meat or protein, including fried   fish and fried chicken. Nuts and nut butters, in large amounts. °Dairy °Whole milk and chocolate milk. Sour cream. Cream. Ice cream. Cream cheese. Milkshakes. °Fats and oils °Butter. Margarine. Shortening. Ghee. °Beverages °Coffee and tea, with or without caffeine. Carbonated beverages. Sodas. Energy  drinks. Fruit juice made with acidic fruits, such as orange or grapefruit. Tomato juice. Alcoholic drinks. °Sweets and desserts °Chocolate and cocoa. Donuts. °Seasonings and condiments °Pepper. Peppermint and spearmint. Added salt. Any condiments, herbs, or seasonings that cause symptoms. For some people, this may include curry, hot sauce, or vinegar-based salad dressings. °The items listed above may not be a complete list of foods and beverages to avoid. Contact a dietitian for more information. °Questions to ask your health care provider °Diet and lifestyle changes are usually the first steps that are taken to manage symptoms of GERD. If diet and lifestyle changes do not improve your symptoms, talk with your health care provider about taking medicines. °Where to find more information °[employer]: aboutgerd.org °Summary °When you have gastroesophageal reflux disease (GERD), food and lifestyle choices may be very helpful in easing the discomfort of GERD. °Eat frequent, small meals instead of three large meals each day. Eat your meals slowly, in a relaxed setting. Avoid bending over or lying down until 2-3 hours after eating. °Limit high-fat foods such as fatty meats or fried foods. °This information is not intended to replace advice given to you by your health care provider. Make sure you discuss any questions you have with your health care provider. °Document Revised: 01/03/2020 Document Reviewed: 01/03/2020 °Elsevier Patient Education © 2022 [employer] ° °

## 2021-07-17 NOTE — Assessment & Plan Note (Signed)
Pantoprazole daily Check labs Refer to GI  

## 2021-07-17 NOTE — Assessment & Plan Note (Signed)
Since this has been going on since September  Refer to GI

## 2021-07-18 LAB — COMPREHENSIVE METABOLIC PANEL
ALT: 18 U/L (ref 0–53)
AST: 19 U/L (ref 0–37)
Albumin: 4.9 g/dL (ref 3.5–5.2)
Alkaline Phosphatase: 50 U/L (ref 39–117)
BUN: 11 mg/dL (ref 6–23)
CO2: 29 mEq/L (ref 19–32)
Calcium: 9.6 mg/dL (ref 8.4–10.5)
Chloride: 102 mEq/L (ref 96–112)
Creatinine, Ser: 0.93 mg/dL (ref 0.40–1.50)
GFR: 101.76 mL/min (ref >60.00)
Glucose, Bld: 75 mg/dL (ref 70–99)
Potassium: 4.2 mEq/L (ref 3.5–5.1)
Sodium: 137 mEq/L (ref 135–145)
Total Bilirubin: 0.8 mg/dL (ref 0.2–1.2)
Total Protein: 7.2 g/dL (ref 6.0–8.3)

## 2021-07-18 LAB — CBC WITH DIFFERENTIAL/PLATELET
Basophils Absolute: 0.1 10*3/uL (ref 0.0–0.1)
Basophils Relative: 0.8 % (ref 0.0–3.0)
Eosinophils Absolute: 0.1 10*3/uL (ref 0.0–0.7)
Eosinophils Relative: 2.3 % (ref 0.0–5.0)
HCT: 49.2 % (ref 39.0–52.0)
Hemoglobin: 16 g/dL (ref 13.0–17.0)
Lymphocytes Relative: 29.8 % (ref 12.0–46.0)
Lymphs Abs: 1.9 10*3/uL (ref 0.7–4.0)
MCHC: 32.5 g/dL (ref 30.0–36.0)
MCV: 85.2 fl (ref 78.0–100.0)
Monocytes Absolute: 0.3 10*3/uL (ref 0.1–1.0)
Monocytes Relative: 4.8 % (ref 3.0–12.0)
Neutro Abs: 4 10*3/uL (ref 1.4–7.7)
Neutrophils Relative %: 62.3 % (ref 43.0–77.0)
Platelets: 162 10*3/uL (ref 150.0–400.0)
RBC: 5.78 Mil/uL (ref 4.22–5.81)
RDW: 13.1 % (ref 11.5–15.5)
WBC: 6.4 10*3/uL (ref 4.0–10.5)

## 2021-07-18 LAB — AMYLASE: Amylase: 65 U/L (ref 27–131)

## 2021-07-18 LAB — LIPASE: Lipase: 23 U/L (ref 11.0–59.0)

## 2021-07-18 LAB — H. PYLORI ANTIBODY, IGG: H Pylori IgG: NEGATIVE

## 2021-08-20 ENCOUNTER — Encounter: Payer: BC Managed Care – PPO | Admitting: Family Medicine

## 2021-09-06 ENCOUNTER — Other Ambulatory Visit: Payer: Self-pay | Admitting: Family Medicine

## 2021-09-06 DIAGNOSIS — G4700 Insomnia, unspecified: Secondary | ICD-10-CM

## 2021-09-06 DIAGNOSIS — F418 Other specified anxiety disorders: Secondary | ICD-10-CM

## 2021-10-01 ENCOUNTER — Encounter: Payer: BC Managed Care – PPO | Admitting: Family Medicine

## 2021-12-04 ENCOUNTER — Encounter: Payer: Self-pay | Admitting: Family Medicine

## 2021-12-04 ENCOUNTER — Ambulatory Visit (INDEPENDENT_AMBULATORY_CARE_PROVIDER_SITE_OTHER): Payer: BC Managed Care – PPO | Admitting: Family Medicine

## 2021-12-04 VITALS — BP 98/60 | HR 70 | Temp 98.2°F | Resp 18 | Ht 69.0 in | Wt 148.8 lb

## 2021-12-04 DIAGNOSIS — Z0000 Encounter for general adult medical examination without abnormal findings: Secondary | ICD-10-CM

## 2021-12-04 DIAGNOSIS — R198 Other specified symptoms and signs involving the digestive system and abdomen: Secondary | ICD-10-CM | POA: Diagnosis not present

## 2021-12-04 DIAGNOSIS — Z808 Family history of malignant neoplasm of other organs or systems: Secondary | ICD-10-CM | POA: Diagnosis not present

## 2021-12-04 DIAGNOSIS — Z125 Encounter for screening for malignant neoplasm of prostate: Secondary | ICD-10-CM | POA: Diagnosis not present

## 2021-12-04 DIAGNOSIS — R1013 Epigastric pain: Secondary | ICD-10-CM

## 2021-12-04 LAB — COMPREHENSIVE METABOLIC PANEL
ALT: 22 U/L (ref 0–53)
AST: 22 U/L (ref 0–37)
Albumin: 5 g/dL (ref 3.5–5.2)
Alkaline Phosphatase: 48 U/L (ref 39–117)
BUN: 12 mg/dL (ref 6–23)
CO2: 30 mEq/L (ref 19–32)
Calcium: 9.9 mg/dL (ref 8.4–10.5)
Chloride: 100 mEq/L (ref 96–112)
Creatinine, Ser: 0.96 mg/dL (ref 0.40–1.50)
GFR: 97.7 mL/min (ref >60.00)
Glucose, Bld: 85 mg/dL (ref 70–99)
Potassium: 4.2 mEq/L (ref 3.5–5.1)
Sodium: 139 mEq/L (ref 135–145)
Total Bilirubin: 0.8 mg/dL (ref 0.2–1.2)
Total Protein: 7.4 g/dL (ref 6.0–8.3)

## 2021-12-04 LAB — CBC WITH DIFFERENTIAL/PLATELET
Basophils Absolute: 0.1 10*3/uL (ref 0.0–0.1)
Basophils Relative: 0.9 % (ref 0.0–3.0)
Eosinophils Absolute: 0.1 10*3/uL (ref 0.0–0.7)
Eosinophils Relative: 2.1 % (ref 0.0–5.0)
HCT: 48.8 % (ref 39.0–52.0)
Hemoglobin: 16.2 g/dL (ref 13.0–17.0)
Lymphocytes Relative: 26.5 % (ref 12.0–46.0)
Lymphs Abs: 1.4 10*3/uL (ref 0.7–4.0)
MCHC: 33.2 g/dL (ref 30.0–36.0)
MCV: 85 fl (ref 78.0–100.0)
Monocytes Absolute: 0.3 10*3/uL (ref 0.1–1.0)
Monocytes Relative: 4.8 % (ref 3.0–12.0)
Neutro Abs: 3.6 10*3/uL (ref 1.4–7.7)
Neutrophils Relative %: 65.7 % (ref 43.0–77.0)
Platelets: 143 10*3/uL — ABNORMAL LOW (ref 150.0–400.0)
RBC: 5.75 Mil/uL (ref 4.22–5.81)
RDW: 13.4 % (ref 11.5–15.5)
WBC: 5.5 10*3/uL (ref 4.0–10.5)

## 2021-12-04 LAB — LIPID PANEL
Cholesterol: 153 mg/dL (ref 0–200)
HDL: 43 mg/dL (ref >39.00)
LDL Cholesterol: 90 mg/dL (ref 0–99)
NonHDL: 109.5
Total CHOL/HDL Ratio: 4
Triglycerides: 97 mg/dL (ref 0.0–149.0)
VLDL: 19.4 mg/dL (ref 0.0–40.0)

## 2021-12-04 LAB — POC URINALSYSI DIPSTICK (AUTOMATED)
Bilirubin, UA: NEGATIVE
Blood, UA: NEGATIVE
Glucose, UA: NEGATIVE
Ketones, UA: NEGATIVE
Leukocytes, UA: NEGATIVE
Nitrite, UA: NEGATIVE
Protein, UA: NEGATIVE
Spec Grav, UA: 1.01 (ref 1.010–1.025)
Urobilinogen, UA: 0.2 E.U./dL
pH, UA: 5 (ref 5.0–8.0)

## 2021-12-04 LAB — TSH: TSH: 1.85 u[IU]/mL (ref 0.35–5.50)

## 2021-12-04 LAB — PSA: PSA: 0.25 ng/mL (ref 0.10–4.00)

## 2021-12-04 MED ORDER — PANTOPRAZOLE SODIUM 40 MG PO TBEC: 40.0000 mg | DELAYED_RELEASE_TABLET | Freq: Every day | ORAL | 3 refills | Status: DC

## 2021-12-04 NOTE — Assessment & Plan Note (Signed)
resolved 

## 2021-12-04 NOTE — Patient Instructions (Signed)

## 2021-12-04 NOTE — Progress Notes (Signed)
Subjective:   By signing my name below, I, [name], attest that this documentation has been prepared under the direction and in the presence of Donato Schultz, DO. 12/04/2021    Patient ID: Tony Jennings, male    DOB: 24-Aug-1980, 41 y.o.   MRN: [id number]  Chief Complaint  Patient presents with   Annual Exam    Pt states fasting     HPI Patient is in today for a comprehensive physical exam.   He is interested in receiving skin cancer screening. His mother has a history of having skin cancer removed.  He was supposed to make an appointment with a GI specialist following his last visit but did receive a call and forgot to follow up. He reports following his last appointment he continued having frequent belching and found that his symptoms resolved after he stopped exercising regularly. His symptoms resumed after he started exercising regularly again. He was not notified of his medication prescribed so he did not start it.  He reports having occasional difficulty urinating.  He continues taking 50 mg trazodone, 100 mg gabapentin and reports no new issues while taking it.  He denies having any fever, new muscle pain, new joint pain, new moles, congestion, sinus pain, sore throat, chest pain, palpations, cough, SOB, wheezing, n/v/d, constipation, blood in stool, dysuria, frequency, hematuria, or headaches at this time. He has 3 Covid-19 vaccines at this time. He does not have the bivalent Covid-19 booster vaccine. He is planning to wait until a new vaccine is available before he takes another Covid-19 vaccine.    Past Medical History:  Diagnosis Date   Acute hypokalemia    Acute hyponatremia    Alcohol abuse    Alcohol-induced chronic pancreatitis (HCC)    ALD (alcoholic liver disease) (HCC)    Anemia 04/05/2017   Anxiety    Depression    Hepatic steatosis    Seizures (HCC)    Ulnar neuropathy at elbow 10/24/2014    Past Surgical History:  Procedure Laterality Date    WRIST SURGERY      Family History  Problem Relation Age of Onset   Hypertension Mother    Skin cancer Mother    Hypertension Father     Social History   Socioeconomic History   Marital status: Significant Other    Spouse name: Not on file   Number of children: Not on file   Years of education: Not on file   Highest education level: Not on file  Occupational History   Occupation: Risk analyst  Tobacco Use   Smoking status: Former    Packs/day: 0.25    Years: 10.00    Pack years: 2.50    Types: Cigarettes    Quit date: 07/18/2019    Years since quitting: 2.3   Smokeless tobacco: Never  Vaping Use   Vaping Use: Never used  Substance and Sexual Activity   Alcohol use: No    Comment: prior alcohol abuse   Drug use: No    Comment: prior use marijuana, snorted ritalin, rare cocain. (But this was when 41 yo)   Sexual activity: Yes    Partners: Female  Other Topics Concern   Not on file  Social History Narrative   Not on file   Social Determinants of Health   Financial Resource Strain: Not on file  Food Insecurity: Not on file  Transportation Needs: Not on file  Physical Activity: Not on file  Stress: Not on file  Social Connections: Not on file  Intimate Partner Violence: Not on file    Outpatient Medications Prior to Visit  Medication Sig Dispense Refill   gabapentin (NEURONTIN) 100 MG capsule TAKE 1 CAPSULE BY MOUTH TWICE A DAY 180 capsule 1   Multiple Vitamin (MULTIVITAMIN WITH MINERALS) TABS tablet Take 1 tablet by mouth daily.     traZODone (DESYREL) 50 MG tablet TAKE 1 TABLET BY MOUTH AT BEDTIME OR AS DIRECTED 90 tablet 1   folic acid (FOLVITE) 1 MG tablet Take 1 tablet (1 mg total) by mouth daily. 30 tablet 1   pantoprazole (PROTONIX) 40 MG tablet Take 1 tablet (40 mg total) by mouth daily. 30 tablet 3   No facility-administered medications prior to visit.    Allergies  Allergen Reactions   Citalopram Other (See Comments)    Tremors     Review of Systems  Constitutional:  Negative for fever.  HENT:  Negative for congestion, sinus pain and sore throat.   Respiratory:  Negative for cough, shortness of breath and wheezing.   Cardiovascular:  Negative for chest pain and palpitations.  Gastrointestinal:  Negative for blood in stool, constipation, diarrhea, nausea and vomiting.  Genitourinary:  Negative for dysuria, frequency and hematuria.  Musculoskeletal:  Negative for joint pain and myalgias.  Skin:        (-)New moles  Neurological:  Negative for headaches.      Objective:    Physical Exam Constitutional:      General: He is not in acute distress.    Appearance: Normal appearance. He is not ill-appearing.  HENT:     Head: Normocephalic and atraumatic.     Right Ear: Tympanic membrane, ear canal and external ear normal.     Left Ear: Tympanic membrane, ear canal and external ear normal.  Eyes:     Extraocular Movements: Extraocular movements intact.     Pupils: Pupils are equal, round, and reactive to light.  Cardiovascular:     Rate and Rhythm: Normal rate and regular rhythm.     Heart sounds: Normal heart sounds. No murmur heard.   No gallop.  Pulmonary:     Effort: Pulmonary effort is normal. No respiratory distress.     Breath sounds: Normal breath sounds. No wheezing or rales.  Abdominal:     General: Bowel sounds are normal. There is no distension.     Palpations: Abdomen is soft.     Tenderness: There is no abdominal tenderness. There is no guarding.  Skin:    General: Skin is warm and dry.  Neurological:     Mental Status: He is alert and oriented to person, place, and time.  Psychiatric:        Judgment: Judgment normal.    BP 98/60 (BP Location: Left Arm, Patient Position: Sitting, Cuff Size: Normal)   Pulse 70   Temp 98.2 F (36.8 C) (Oral)   Resp 18   Ht 5\' 9"  (1.753 m)   Wt 148 lb 12.8 oz (67.5 kg)   SpO2 99%   BMI 21.97 kg/m  Wt Readings from Last 3 Encounters:  12/04/21 148  lb 12.8 oz (67.5 kg)  07/17/21 151 lb 3.2 oz (68.6 kg)  08/08/20 150 lb (68 kg)    Diabetic Foot Exam - Simple   No data filed    Lab Results  Component Value Date   WBC 6.4 07/17/2021   HGB 16.0 07/17/2021   HCT 49.2 07/17/2021   PLT 162.0 07/17/2021   GLUCOSE  75 07/17/2021   CHOL 144 08/08/2020   TRIG 98.0 08/08/2020   HDL 38.90 (L) 08/08/2020   LDLCALC 85 08/08/2020   ALT 18 07/17/2021   AST 19 07/17/2021   NA 137 07/17/2021   K 4.2 07/17/2021   CL 102 07/17/2021   CREATININE 0.93 07/17/2021   BUN 11 07/17/2021   CO2 29 07/17/2021   TSH 1.19 08/08/2020   PSA 0.23 08/08/2020   INR 1.00 04/07/2017    Lab Results  Component Value Date   TSH 1.19 08/08/2020   Lab Results  Component Value Date   WBC 6.4 07/17/2021   HGB 16.0 07/17/2021   HCT 49.2 07/17/2021   MCV 85.2 07/17/2021   PLT 162.0 07/17/2021   Lab Results  Component Value Date   NA 137 07/17/2021   K 4.2 07/17/2021   CO2 29 07/17/2021   GLUCOSE 75 07/17/2021   BUN 11 07/17/2021   CREATININE 0.93 07/17/2021   BILITOT 0.8 07/17/2021   ALKPHOS 50 07/17/2021   AST 19 07/17/2021   ALT 18 07/17/2021   PROT 7.2 07/17/2021   ALBUMIN 4.9 07/17/2021   CALCIUM 9.6 07/17/2021   ANIONGAP 6 04/09/2017   GFR 101.76 07/17/2021   Lab Results  Component Value Date   CHOL 144 08/08/2020   Lab Results  Component Value Date   HDL 38.90 (L) 08/08/2020   Lab Results  Component Value Date   LDLCALC 85 08/08/2020   Lab Results  Component Value Date   TRIG 98.0 08/08/2020   Lab Results  Component Value Date   CHOLHDL 4 08/08/2020   No results found for: HGBA1C  PSA- Last completed 08/08/2020. Results showed 0.23.     Assessment & Plan:   Problem List Items Addressed This Visit       Unprioritized   Preventative health care - Primary    ghm utd Check labs  See avs       Relevant Orders   CBC with Differential/Platelet   Comprehensive metabolic panel   Lipid panel   TSH   PSA    POCT Urinalysis Dipstick (Automated) (Completed)   Dyspepsia    protonix qd  Gi pending       Relevant Medications   pantoprazole (PROTONIX) 40 MG tablet   Alternating constipation and diarrhea    resolved       Other Visit Diagnoses     Family history of skin cancer       Relevant Orders   Ambulatory referral to Dermatology        Meds ordered this encounter  Medications   pantoprazole (PROTONIX) 40 MG tablet    Sig: Take 1 tablet (40 mg total) by mouth daily.    Dispense:  30 tablet    Refill:  3    I, [name], DO, personally preformed the services described in this documentation.  All medical record entries made by the scribe were at my direction and in my presence.  I have reviewed the chart and discharge instructions (if applicable) and agree that the record reflects my personal performance and is accurate and complete. 12/04/2021   I,[name],acting as a scribe for [name], DO.,have documented all relevant documentation on the behalf of [name], DO,as directed by  [name], DO while in the presence of [name], DO.   [name], DO

## 2021-12-04 NOTE — Assessment & Plan Note (Signed)
protonix qd  Gi pending

## 2021-12-04 NOTE — Assessment & Plan Note (Signed)
ghm utd Check labs  See avs  

## 2021-12-05 ENCOUNTER — Encounter: Payer: Self-pay | Admitting: Family Medicine

## 2021-12-06 ENCOUNTER — Other Ambulatory Visit: Payer: Self-pay | Admitting: Family Medicine

## 2021-12-06 ENCOUNTER — Telehealth: Payer: Self-pay

## 2021-12-06 DIAGNOSIS — D691 Qualitative platelet defects: Secondary | ICD-10-CM

## 2021-12-06 NOTE — Telephone Encounter (Signed)
Key: [id number]  Sent to plan

## 2021-12-12 NOTE — Telephone Encounter (Signed)
Deniedon June 3 Your request has been denied

## 2021-12-28 DIAGNOSIS — L821 Other seborrheic keratosis: Secondary | ICD-10-CM | POA: Diagnosis not present

## 2021-12-28 DIAGNOSIS — D225 Melanocytic nevi of trunk: Secondary | ICD-10-CM | POA: Diagnosis not present

## 2021-12-28 DIAGNOSIS — L578 Other skin changes due to chronic exposure to nonionizing radiation: Secondary | ICD-10-CM | POA: Diagnosis not present

## 2022-03-29 ENCOUNTER — Other Ambulatory Visit: Payer: Self-pay | Admitting: Family Medicine

## 2022-03-29 DIAGNOSIS — G4700 Insomnia, unspecified: Secondary | ICD-10-CM

## 2022-03-29 DIAGNOSIS — F418 Other specified anxiety disorders: Secondary | ICD-10-CM

## 2022-04-08 ENCOUNTER — Encounter: Payer: Self-pay | Admitting: Family Medicine

## 2022-07-18 ENCOUNTER — Encounter: Payer: Self-pay | Admitting: Family Medicine

## 2022-07-18 ENCOUNTER — Ambulatory Visit: Payer: BC Managed Care – PPO | Admitting: Family Medicine

## 2022-07-18 VITALS — BP 119/82 | HR 72 | Temp 98.2°F | Resp 16 | Ht 68.0 in | Wt 148.8 lb

## 2022-07-18 DIAGNOSIS — R519 Headache, unspecified: Secondary | ICD-10-CM | POA: Diagnosis not present

## 2022-07-18 LAB — POCT INFLUENZA A/B
Influenza A, POC: NEGATIVE
Influenza B, POC: NEGATIVE

## 2022-07-18 LAB — POC COVID19 BINAXNOW: SARS Coronavirus 2 Ag: NEGATIVE

## 2022-07-18 MED ORDER — CYCLOBENZAPRINE HCL 5 MG PO TABS: 5.0000 mg | ORAL_TABLET | Freq: Three times a day (TID) | ORAL | 0 refills | Status: DC | PRN

## 2022-07-18 MED ORDER — AMOXICILLIN-POT CLAVULANATE 875-125 MG PO TABS: 1.0000 | ORAL_TABLET | Freq: Two times a day (BID) | ORAL | 0 refills | Status: AC

## 2022-07-18 MED ORDER — FLUTICASONE PROPIONATE 50 MCG/ACT NA SUSP: 2.0000 | Freq: Every day | NASAL | 6 refills | Status: DC

## 2022-07-18 NOTE — Patient Instructions (Addendum)
Will send a "watch and wait" antibiotic in case symptoms worsen over the next 2-3 days. Otherwise, continue supportive measures including rest, hydration, humidifier use, steam showers, warm compresses to sinuses, warm liquids with lemon and honey, and over-the-counter cough, cold, and analgesics as needed.   Adding a few days of muscle relaxer for upper trap tension. Try heating pad, massage, stretching.  Deferred prednisone at this time, recommend trying regular Flonase nasal spray.  Please contact office for follow-up if symptoms do not improve or worsen. Seek emergency care if symptoms become severe.

## 2022-07-18 NOTE — Progress Notes (Signed)
Acute Office Visit  Subjective:     Patient ID: Tony Jennings, male    DOB: 09-Dec-1980, 42 y.o.   MRN: [id number]  Chief Complaint  Patient presents with   ears clogged   Nasal Congestion    HPI   Upper Respiratory Infection: Patient complains of symptoms of a URI, possible sinusitis. Symptoms include plugged sensation in both ears and facial/sinus pressure, headache (tension type), raspy voice, postnasal drainage, possible body aches/neck tension . He tried getting a massage but that didn't seem to help much. Onset of symptoms was about 7 days ago, stable since that time. Marland Kitchen  He is drinking plenty of fluids. Evaluation to date: none. Treatment to date: none.Symptoms tend to be worse at the end of the day. He denies rhinorrhea, fevers, chills, GI/GU symptoms, chest pain, dyspnea. No known sick contacts.    ROS All review of systems negative except what is listed in the HPI      Objective:    BP 119/82   Pulse 72   Temp 98.2 F (36.8 C)   Resp 16   Ht 5\' 8"  (1.727 m)   Wt 148 lb 12.8 oz (67.5 kg)   SpO2 98%   BMI 22.62 kg/m    Physical Exam Constitutional:      General: He is not in acute distress.    Appearance: Normal appearance. He is not ill-appearing.  HENT:     Head: Normocephalic and atraumatic.     Right Ear: There is impacted cerumen.     Left Ear: Tympanic membrane normal.     Mouth/Throat:     Mouth: Mucous membranes are moist.     Pharynx: Oropharynx is clear.  Eyes:     Conjunctiva/sclera: Conjunctivae normal.  Neck:     Comments: Mild upper trap tension Cardiovascular:     Rate and Rhythm: Normal rate and regular rhythm.  Pulmonary:     Effort: Pulmonary effort is normal.     Breath sounds: Normal breath sounds.  Musculoskeletal:        General: No swelling or tenderness. Normal range of motion.  Lymphadenopathy:     Cervical: Cervical adenopathy present.  Skin:    General: Skin is warm.  Neurological:     General: No focal deficit  present.     Mental Status: He is alert and oriented to person, place, and time. Mental status is at baseline.  Psychiatric:        Mood and Affect: Mood normal.        Behavior: Behavior normal.        Thought Content: Thought content normal.        Judgment: Judgment normal.     No results found for any visits on 07/18/22.      Assessment & Plan:   Problem List Items Addressed This Visit   None Visit Diagnoses     Sinus headache    -  Primary   Relevant Medications   cyclobenzaprine (FLEXERIL) 5 MG tablet   fluticasone (FLONASE) 50 MCG/ACT nasal spray   amoxicillin-clavulanate (AUGMENTIN) 875-125 MG tablet   Other Relevant Orders   POC COVID-19   POCT Influenza A/B     Flu/COVID negative Will send a "watch and wait" antibiotic in case symptoms worsen over the next 2-3 days. Otherwise, continue supportive measures including rest, hydration, humidifier use, steam showers, warm compresses to sinuses, warm liquids with lemon and honey, and over-the-counter cough, cold, and analgesics as needed.   Adding  a few days of muscle relaxer for upper trap tension. Try heating pad, massage, stretching. Deferred prednisone at this time, recommend trying regular Flonase nasal spray.  Patient aware of signs/symptoms requiring further/urgent evaluation.     Meds ordered this encounter  Medications   cyclobenzaprine (FLEXERIL) 5 MG tablet    Sig: Take 1 tablet (5 mg total) by mouth 3 (three) times daily as needed for muscle spasms.    Dispense:  15 tablet    Refill:  0    Order Specific Question:   Supervising Provider    Answer:   Penni Homans A [[id number]]   fluticasone (FLONASE) 50 MCG/ACT nasal spray    Sig: Place 2 sprays into both nostrils daily.    Dispense:  16 g    Refill:  6    Order Specific Question:   Supervising Provider    Answer:   Penni Homans A [[id number]]   amoxicillin-clavulanate (AUGMENTIN) 875-125 MG tablet    Sig: Take 1 tablet by mouth 2 (two) times daily for 7  days.    Dispense:  14 tablet    Refill:  0    Order Specific Question:   Supervising Provider    Answer:   Penni Homans A [[id number]]    Return if symptoms worsen or fail to improve.  Terrilyn Saver, NP

## 2022-09-05 ENCOUNTER — Ambulatory Visit: Payer: BC Managed Care – PPO | Admitting: Family Medicine

## 2022-09-05 ENCOUNTER — Encounter: Payer: Self-pay | Admitting: Family Medicine

## 2022-09-05 VITALS — BP 100/80 | HR 70 | Temp 98.2°F | Resp 18 | Ht 68.0 in | Wt 146.2 lb

## 2022-09-05 DIAGNOSIS — R1013 Epigastric pain: Secondary | ICD-10-CM

## 2022-09-05 DIAGNOSIS — F411 Generalized anxiety disorder: Secondary | ICD-10-CM | POA: Diagnosis not present

## 2022-09-05 DIAGNOSIS — D696 Thrombocytopenia, unspecified: Secondary | ICD-10-CM

## 2022-09-05 MED ORDER — FAMOTIDINE 40 MG PO TABS: 40.0000 mg | ORAL_TABLET | Freq: Every day | ORAL | 3 refills | Status: DC

## 2022-09-05 NOTE — Patient Instructions (Signed)
Food Choices for Gastroesophageal Reflux Disease, Adult When you have gastroesophageal reflux disease (GERD), the foods you eat and your eating habits are very important. Choosing the right foods can help ease the discomfort of GERD. Consider working with a dietitian to help you make healthy food choices. What are tips for following this plan? Reading food labels Look for foods that are low in saturated fat. Foods that have less than 5% of daily value (DV) of fat and 0 g of trans fats may help with your symptoms. Cooking Cook foods using methods other than frying. This may include baking, steaming, grilling, or broiling. These are all methods that do not need a lot of fat for cooking. To add flavor, try to use herbs that are low in spice and acidity. Meal planning  Choose healthy foods that are low in fat, such as fruits, vegetables, whole grains, low-fat dairy products, lean meats, fish, and poultry. Eat frequent, small meals instead of three large meals each day. Eat your meals slowly, in a relaxed setting. Avoid bending over or lying down until 2-3 hours after eating. Limit high-fat foods such as fatty meats or fried foods. Limit your intake of fatty foods, such as oils, butter, and shortening. Avoid the following as told by your health care provider: Foods that cause symptoms. These may be different for different people. Keep a food diary to keep track of foods that cause symptoms. Alcohol. Drinking large amounts of liquid with meals. Eating meals during the 2-3 hours before bed. Lifestyle Maintain a healthy weight. Ask your health care provider what weight is healthy for you. If you need to lose weight, work with your health care provider to do so safely. Exercise for at least 30 minutes on 5 or more days each week, or as told by your health care provider. Avoid wearing clothes that fit tightly around your waist and chest. Do not use any products that contain nicotine or tobacco. These  products include cigarettes, chewing tobacco, and vaping devices, such as e-cigarettes. If you need help quitting, ask your health care provider. Sleep with the head of your bed raised. Use a wedge under the mattress or blocks under the bed frame to raise the head of the bed. Chew sugar-free gum after mealtimes. What foods should I eat?  Eat a healthy, well-balanced diet of fruits, vegetables, whole grains, low-fat dairy products, lean meats, fish, and poultry. Each person is different. Foods that may trigger symptoms in one person may not trigger any symptoms in another person. Work with your health care provider to identify foods that are safe for you. The items listed above may not be a complete list of recommended foods and beverages. Contact a dietitian for more information. What foods should I avoid? Limiting some of these foods may help manage the symptoms of GERD. Everyone is different. Consult a dietitian or your health care provider to help you identify the exact foods to avoid, if any. Fruits Any fruits prepared with added fat. Any fruits that cause symptoms. For some people this may include citrus fruits, such as oranges, grapefruit, pineapple, and lemons. Vegetables Deep-fried vegetables. French fries. Any vegetables prepared with added fat. Any vegetables that cause symptoms. For some people, this may include tomatoes and tomato products, chili peppers, onions and garlic, and horseradish. Grains Pastries or quick breads with added fat. Meats and other proteins High-fat meats, such as fatty beef or pork, hot dogs, ribs, ham, sausage, salami, and bacon. Fried meat or protein, including   fried fish and fried chicken. Nuts and nut butters, in large amounts. Dairy Whole milk and chocolate milk. Sour cream. Cream. Ice cream. Cream cheese. Milkshakes. Fats and oils Butter. Margarine. Shortening. Ghee. Beverages Coffee and tea, with or without caffeine. Carbonated beverages. Sodas. Energy  drinks. Fruit juice made with acidic fruits, such as orange or grapefruit. Tomato juice. Alcoholic drinks. Sweets and desserts Chocolate and cocoa. Donuts. Seasonings and condiments Pepper. Peppermint and spearmint. Added salt. Any condiments, herbs, or seasonings that cause symptoms. For some people, this may include curry, hot sauce, or vinegar-based salad dressings. The items listed above may not be a complete list of foods and beverages to avoid. Contact a dietitian for more information. Questions to ask your health care provider Diet and lifestyle changes are usually the first steps that are taken to manage symptoms of GERD. If diet and lifestyle changes do not improve your symptoms, talk with your health care provider about taking medicines. Where to find more information [employer]: aboutgerd.org Summary When you have gastroesophageal reflux disease (GERD), food and lifestyle choices may be very helpful in easing the discomfort of GERD. Eat frequent, small meals instead of three large meals each day. Eat your meals slowly, in a relaxed setting. Avoid bending over or lying down until 2-3 hours after eating. Limit high-fat foods such as fatty meats or fried foods. This information is not intended to replace advice given to you by your health care provider. Make sure you discuss any questions you have with your health care provider. Document Revised: 01/03/2020 Document Reviewed: 01/03/2020 Elsevier Patient Education  2023 [employer]  

## 2022-09-05 NOTE — Progress Notes (Signed)
Subjective:   By signing my name below, I, Tony Jennings, attest that this documentation has been prepared under the direction and in the presence of [name], DO. 09/05/2022   Patient ID: Tony Jennings, male    DOB: Mar 15, 1981, 42 y.o.   MRN: [id number]  Chief Complaint  Patient presents with   GI Problem    Pt states having upset stomach, abdominal discomfort, some burning.     GI Problem The primary symptoms include abdominal pain. Primary symptoms do not include fever, nausea, dysuria or rash.   Patient is in today for a office visit.   He complains of his belching returning. He also complains of abdominal pain. He has increased stress recently and thinks it may be contributing to his symptoms. He denies having any chest pain. He has taken Protonix to manage his symptoms and found relief but stopped.    Past Medical History:  Diagnosis Date   Acute hypokalemia    Acute hyponatremia    Alcohol abuse    Alcohol-induced chronic pancreatitis (HCC)    ALD (alcoholic liver disease) ([place])    Anemia 04/05/2017   Anxiety    Depression    Hepatic steatosis    Preventative health care 03/22/2019   Seizures ([place])    Ulnar neuropathy at elbow 10/24/2014    Past Surgical History:  Procedure Laterality Date   WRIST SURGERY      Family History  Problem Relation Age of Onset   Hypertension Mother    Skin cancer Mother    Hypertension Father     Social History   Socioeconomic History   Marital status: Significant Other    Spouse name: Not on file   Number of children: Not on file   Years of education: Not on file   Highest education level: Not on file  Occupational History   Occupation: Corporate treasurer  Tobacco Use   Smoking status: Former    Packs/day: 0.25    Years: 10.00    Total pack years: 2.50    Types: Cigarettes    Quit date: 07/18/2019    Years since quitting: 3.1   Smokeless tobacco: Never  Vaping Use   Vaping Use: Never used   Substance and Sexual Activity   Alcohol use: No    Comment: prior alcohol abuse   Drug use: No    Comment: prior use marijuana, snorted ritalin, rare cocain. (But this was when 42 yo)   Sexual activity: Yes    Partners: Female  Other Topics Concern   Not on file  Social History Narrative   Not on file   Social Determinants of Health   Financial Resource Strain: Not on file  Food Insecurity: Not on file  Transportation Needs: Not on file  Physical Activity: Not on file  Stress: Not on file  Social Connections: Not on file  Intimate Partner Violence: Not on file    Outpatient Medications Prior to Visit  Medication Sig Dispense Refill   gabapentin (NEURONTIN) 100 MG capsule TAKE 1 CAPSULE BY MOUTH TWICE A DAY 180 capsule 1   Multiple Vitamin (MULTIVITAMIN WITH MINERALS) TABS tablet Take 1 tablet by mouth daily.     traZODone (DESYREL) 50 MG tablet TAKE 1 TABLET BY MOUTH AT BEDTIME OR AS DIRECTED 90 tablet 1   cyclobenzaprine (FLEXERIL) 5 MG tablet Take 1 tablet (5 mg total) by mouth 3 (three) times daily as needed for muscle spasms. 15 tablet 0   fluticasone (FLONASE)  50 MCG/ACT nasal spray Place 2 sprays into both nostrils daily. 16 g 6   No facility-administered medications prior to visit.    Allergies  Allergen Reactions   Citalopram Other (See Comments)    Tremors    Review of Systems  Constitutional:  Negative for fever and malaise/fatigue.  HENT:  Negative for congestion.   Eyes:  Negative for blurred vision.  Respiratory:  Negative for shortness of breath.   Cardiovascular:  Negative for chest pain, palpitations and leg swelling.  Gastrointestinal:  Positive for abdominal pain. Negative for blood in stool and nausea.       (+)belching  Genitourinary:  Negative for dysuria and frequency.  Musculoskeletal:  Negative for falls.  Skin:  Negative for rash.  Neurological:  Negative for dizziness, loss of consciousness and headaches.  Endo/Heme/Allergies:  Negative  for environmental allergies.  Psychiatric/Behavioral:  Negative for depression. The patient is not nervous/anxious.        Objective:    Physical Exam Vitals and nursing note reviewed.  Constitutional:      General: He is not in acute distress.    Appearance: Normal appearance. He is not ill-appearing.  HENT:     Head: Normocephalic and atraumatic.     Right Ear: External ear normal.     Left Ear: External ear normal.  Eyes:     Extraocular Movements: Extraocular movements intact.     Pupils: Pupils are equal, round, and reactive to light.  Cardiovascular:     Rate and Rhythm: Normal rate and regular rhythm.     Heart sounds: Normal heart sounds. No murmur heard.    No gallop.  Pulmonary:     Effort: Pulmonary effort is normal. No respiratory distress.     Breath sounds: Normal breath sounds. No wheezing or rales.  Abdominal:     General: There is no distension.     Palpations: Abdomen is soft.     Tenderness: There is no abdominal tenderness. There is no guarding.  Skin:    General: Skin is warm and dry.  Neurological:     Mental Status: He is alert and oriented to person, place, and time.  Psychiatric:        Judgment: Judgment normal.     BP 100/80 (BP Location: Left Arm, Patient Position: Sitting, Cuff Size: Normal)   Pulse 70   Temp 98.2 F (36.8 C) (Oral)   Resp 18   Ht '5\' 8"'$  (1.727 m)   Wt 146 lb 3.2 oz (66.3 kg)   SpO2 93%   BMI 22.23 kg/m  Wt Readings from Last 3 Encounters:  09/05/22 146 lb 3.2 oz (66.3 kg)  07/18/22 148 lb 12.8 oz (67.5 kg)  12/04/21 148 lb 12.8 oz (67.5 kg)       Assessment & Plan:  Dyspepsia -     CBC+Platelet+Hem Review -     Comprehensive metabolic panel -     H. pylori breath test -     Amylase -     Lipase -     Famotidine; Take 1 tablet (40 mg total) by mouth daily.  Dispense: 90 tablet; Refill: 3 -     Ambulatory referral to Gastroenterology  Low platelet count [facility]) -     CBC+Platelet+Hem Review    I, [name], DO, personally preformed the services described in this documentation.  All medical record entries made by the scribe were at my direction and in my presence.  I have reviewed  the chart and discharge instructions (if applicable) and agree that the record reflects my personal performance and is accurate and complete. 09/05/2022   I,Tony Jennings,acting as a scribe for [name], DO.,have documented all relevant documentation on the behalf of [name], DO,as directed by  [name], DO while in the presence of [name], DO.   [name], DO

## 2022-09-06 ENCOUNTER — Encounter: Payer: Self-pay | Admitting: Family Medicine

## 2022-09-06 LAB — COMPREHENSIVE METABOLIC PANEL
ALT: 20 U/L (ref 0–53)
AST: 20 U/L (ref 0–37)
Albumin: 4.4 g/dL (ref 3.5–5.2)
Alkaline Phosphatase: 46 U/L (ref 39–117)
BUN: 14 mg/dL (ref 6–23)
CO2: 29 mEq/L (ref 19–32)
Calcium: 9.7 mg/dL (ref 8.4–10.5)
Chloride: 102 mEq/L (ref 96–112)
Creatinine, Ser: 0.79 mg/dL (ref 0.40–1.50)
GFR: 109.22 mL/min (ref >60.00)
Glucose, Bld: 76 mg/dL (ref 70–99)
Potassium: 4.4 mEq/L (ref 3.5–5.1)
Sodium: 138 mEq/L (ref 135–145)
Total Bilirubin: 0.4 mg/dL (ref 0.2–1.2)
Total Protein: 7.2 g/dL (ref 6.0–8.3)

## 2022-09-06 LAB — LIPASE: Lipase: 31 U/L (ref 11.0–59.0)

## 2022-09-06 LAB — AMYLASE: Amylase: 75 U/L (ref 27–131)

## 2022-09-09 LAB — H. PYLORI BREATH TEST: H. pylori Breath Test: NOT DETECTED

## 2022-09-11 LAB — CBC+PLATELET+HEM REVIEW
BASO(ABSOLUTE): 0.1 10*3/uL (ref 0.0–0.2)
Basophils Manual: 1 %
EOS (ABSOLUTE VALUE): 0.2 10*3/uL (ref 0.0–0.4)
Eosinophils Manual: 3 %
Hematocrit: 50 % (ref 37.5–51.0)
Hemoglobin: 17.2 g/dL (ref 13.0–17.7)
Lymphocytes Absolute: 1.9 10*3/uL (ref 0.7–3.1)
Lymphocytes Manual: 33 %
MCH: 29.4 pg (ref 26.6–33.0)
MCHC: 34.4 g/dL (ref 31.5–35.7)
MCV: 85 fL (ref 79–97)
Monocytes Manual: 7 %
Monocytes(Absolute): 0.4 10*3/uL (ref 0.1–0.9)
Neutrophils Absolute: 3.2 10*3/uL (ref 1.4–7.0)
Neutrophils Manual: 56 %
PLATELET COMMENT: ADEQUATE
Platelets: 160 10*3/uL (ref 150–450)
RBC COMMENT: NORMAL
RBC: 5.86 x10E6/uL — ABNORMAL HIGH (ref 4.14–5.80)
RDW: 12.1 % (ref 11.6–15.4)
WBC: 5.8 10*3/uL (ref 3.4–10.8)

## 2022-10-31 ENCOUNTER — Other Ambulatory Visit: Payer: Self-pay | Admitting: Family Medicine

## 2022-10-31 DIAGNOSIS — G4700 Insomnia, unspecified: Secondary | ICD-10-CM

## 2022-11-13 DIAGNOSIS — F411 Generalized anxiety disorder: Secondary | ICD-10-CM | POA: Diagnosis not present

## 2022-11-27 DIAGNOSIS — F411 Generalized anxiety disorder: Secondary | ICD-10-CM | POA: Diagnosis not present

## 2022-12-09 ENCOUNTER — Encounter: Payer: Self-pay | Admitting: Family Medicine

## 2022-12-09 ENCOUNTER — Ambulatory Visit (INDEPENDENT_AMBULATORY_CARE_PROVIDER_SITE_OTHER): Payer: BC Managed Care – PPO | Admitting: Family Medicine

## 2022-12-09 VITALS — BP 116/70 | HR 64 | Ht 68.0 in | Wt 148.4 lb

## 2022-12-09 DIAGNOSIS — G8929 Other chronic pain: Secondary | ICD-10-CM

## 2022-12-09 DIAGNOSIS — M65312 Trigger thumb, left thumb: Secondary | ICD-10-CM | POA: Diagnosis not present

## 2022-12-09 DIAGNOSIS — M25562 Pain in left knee: Secondary | ICD-10-CM

## 2022-12-09 DIAGNOSIS — Z0000 Encounter for general adult medical examination without abnormal findings: Secondary | ICD-10-CM

## 2022-12-09 DIAGNOSIS — Z125 Encounter for screening for malignant neoplasm of prostate: Secondary | ICD-10-CM

## 2022-12-09 LAB — LIPID PANEL
Cholesterol: 126 mg/dL (ref 0–200)
HDL: 41.4 mg/dL (ref >39.00)
LDL Cholesterol: 70 mg/dL (ref 0–99)
NonHDL: 84.75
Total CHOL/HDL Ratio: 3
Triglycerides: 72 mg/dL (ref 0.0–149.0)
VLDL: 14.4 mg/dL (ref 0.0–40.0)

## 2022-12-09 LAB — CBC WITH DIFFERENTIAL/PLATELET
Basophils Absolute: 0 10*3/uL (ref 0.0–0.1)
Basophils Relative: 0.5 % (ref 0.0–3.0)
Eosinophils Absolute: 0.2 10*3/uL (ref 0.0–0.7)
Eosinophils Relative: 1.9 % (ref 0.0–5.0)
HCT: 47.4 % (ref 39.0–52.0)
Hemoglobin: 15.8 g/dL (ref 13.0–17.0)
Lymphocytes Relative: 14.9 % (ref 12.0–46.0)
Lymphs Abs: 1.2 10*3/uL (ref 0.7–4.0)
MCHC: 33.3 g/dL (ref 30.0–36.0)
MCV: 85.5 fl (ref 78.0–100.0)
Monocytes Absolute: 0.4 10*3/uL (ref 0.1–1.0)
Monocytes Relative: 4.2 % (ref 3.0–12.0)
Neutro Abs: 6.6 10*3/uL (ref 1.4–7.7)
Neutrophils Relative %: 78.5 % — ABNORMAL HIGH (ref 43.0–77.0)
Platelets: 140 10*3/uL — ABNORMAL LOW (ref 150.0–400.0)
RBC: 5.54 Mil/uL (ref 4.22–5.81)
RDW: 13.8 % (ref 11.5–15.5)
WBC: 8.4 10*3/uL (ref 4.0–10.5)

## 2022-12-09 LAB — COMPREHENSIVE METABOLIC PANEL
ALT: 19 U/L (ref 0–53)
AST: 19 U/L (ref 0–37)
Albumin: 4.5 g/dL (ref 3.5–5.2)
Alkaline Phosphatase: 48 U/L (ref 39–117)
BUN: 10 mg/dL (ref 6–23)
CO2: 30 mEq/L (ref 19–32)
Calcium: 9.2 mg/dL (ref 8.4–10.5)
Chloride: 99 mEq/L (ref 96–112)
Creatinine, Ser: 0.82 mg/dL (ref 0.40–1.50)
GFR: 107.8 mL/min (ref >60.00)
Glucose, Bld: 96 mg/dL (ref 70–99)
Potassium: 4.5 mEq/L (ref 3.5–5.1)
Sodium: 135 mEq/L (ref 135–145)
Total Bilirubin: 0.5 mg/dL (ref 0.2–1.2)
Total Protein: 6.7 g/dL (ref 6.0–8.3)

## 2022-12-09 LAB — PSA: PSA: 0.38 ng/mL (ref 0.10–4.00)

## 2022-12-09 LAB — TSH: TSH: 1.26 u[IU]/mL (ref 0.35–5.50)

## 2022-12-09 NOTE — Assessment & Plan Note (Signed)
Off and on Knee sleeve  Tylenol arthritis prn
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
## 2022-12-09 NOTE — Assessment & Plan Note (Signed)
Ghm utd Check labs  See AVS Health Maintenance  Topic Date Due   INFLUENZA VACCINE  02/06/2023   DTaP/Tdap/Td (2 - Td or Tdap) 02/22/2026   Hepatitis C Screening  Completed   HIV Screening  Completed   HPV VACCINES  Aged Out   COVID-19 Vaccine  Discontinued

## 2022-12-09 NOTE — Assessment & Plan Note (Signed)
Only occurs on occasion  If it starts to worsen he will call for referral to hand specialist

## 2022-12-21 ENCOUNTER — Other Ambulatory Visit: Payer: Self-pay | Admitting: Family Medicine

## 2022-12-21 DIAGNOSIS — F418 Other specified anxiety disorders: Secondary | ICD-10-CM

## 2022-12-24 ENCOUNTER — Encounter: Payer: Self-pay | Admitting: Family Medicine

## 2022-12-25 MED ORDER — SERTRALINE HCL 25 MG PO TABS: ORAL_TABLET | ORAL | 1 refills | Status: DC

## 2023-03-21 ENCOUNTER — Other Ambulatory Visit: Payer: Self-pay | Admitting: Family Medicine

## 2023-03-21 DIAGNOSIS — G4700 Insomnia, unspecified: Secondary | ICD-10-CM

## 2023-07-29 ENCOUNTER — Encounter: Payer: Self-pay | Admitting: Family Medicine

## 2023-07-29 ENCOUNTER — Other Ambulatory Visit: Payer: Self-pay | Admitting: Family Medicine

## 2023-07-29 DIAGNOSIS — R41840 Attention and concentration deficit: Secondary | ICD-10-CM

## 2023-08-07 ENCOUNTER — Other Ambulatory Visit: Payer: Self-pay | Admitting: Family Medicine

## 2023-08-07 DIAGNOSIS — G4700 Insomnia, unspecified: Secondary | ICD-10-CM

## 2023-08-07 DIAGNOSIS — F418 Other specified anxiety disorders: Secondary | ICD-10-CM

## 2023-10-14 ENCOUNTER — Encounter: Payer: Self-pay | Admitting: Family Medicine

## 2023-10-15 ENCOUNTER — Other Ambulatory Visit: Payer: Self-pay | Admitting: Family Medicine

## 2023-10-15 ENCOUNTER — Ambulatory Visit: Payer: BC Managed Care – PPO | Admitting: Psychology

## 2023-10-15 DIAGNOSIS — T753XXA Motion sickness, initial encounter: Secondary | ICD-10-CM

## 2023-10-15 DIAGNOSIS — F89 Unspecified disorder of psychological development: Secondary | ICD-10-CM | POA: Diagnosis not present

## 2023-10-15 MED ORDER — SCOPOLAMINE 1 MG/3DAYS TD PT72: 1.0000 | MEDICATED_PATCH | TRANSDERMAL | 12 refills | Status: DC

## 2023-10-15 NOTE — Progress Notes (Addendum)
 Date: 10/15/2023 Appointment Start Time: 1:02pm Duration: 120 minutes Provider: Maryetta Sneddon, PsyD Type of Session: Initial Appointment for Evaluation  Location of Patient: Home Location of Provider: Provider's Home (private office) Type of Contact: Caregility video visit with audio  Session Content:  Prior to proceeding with today's appointment, two pieces of identifying information were obtained from [place] to verify identity. In addition, Georg's physical location at the time of this appointment was obtained. In the event of technical difficulties, Oz shared a phone number he could be reached at. Sevag and this provider participated in today's telepsychological service. Demetria denied anyone else being present in the room or on the virtual appointment.  The provider's role was explained to [place]. The provider reviewed and discussed issues of confidentiality, privacy, and limits therein (e.g., reporting obligations). In addition to verbal informed consent, written informed consent for psychological services was obtained from Tony Jennings prior to the initial appointment. Written consent included information concerning the practice, financial arrangements, and confidentiality and patients' rights. Since the clinic is not a 24/7 crisis center, mental health emergency resources were shared, and the provider explained e-mail, voicemail, and/or other messaging systems should be utilized only for non-emergency reasons. This provider also explained that information obtained during appointments will be placed in their electronic medical record in a confidential manner. Tony Jennings verbally acknowledged understanding of the aforementioned and agreed to use mental health emergency resources discussed if needed. Moreover, Tony Jennings agreed information may be shared with other [facility] or their referring provider(s) as needed for coordination of care. By signing the new patient documents, Tony Jennings provided written  consent for coordination of care. Tony Jennings verbally acknowledged understanding he is ultimately responsible for understanding his insurance benefits as it relates to reimbursement of telepsychological and in-person services. This provider also reviewed confidentiality, as it relates to telepsychological services, as well as the rationale for telepsychological services. This provider further explained that video should not be captured, photos should not be taken, nor should testing stimuli be copied or recorded as it would be a copyright violation. Tony Jennings expressed understanding of the aforementioned, and verbally consented to proceed. Tony Jennings is aware of the limitations of teleheath visits and verbally consented to proceed.  Tony Jennings completed the neurobehavioral examination, which included obtaining a, family, social, and psychiatric history; integration of prior history and other sources of clinical data to assist with clinical decision making; behavioral observations; assessment of thinking, reasoning, and judgment; and establishment of a provisional diagnosis. The evaluation was completed in 120 minutes. Codes [id number] and [id number] were billed.   Mental Status Examination:  Appearance:  neat Behavior: appropriate to circumstances Mood: anxious Affect: mood congruent Speech: pressured and fast Eye Contact: appropriate Psychomotor Activity: restless Thought Process: denies suicidal, homicidal, and self-harm ideation, plan and intent Content/Perceptual Disturbances: none Orientation: AAOx4 Cognition/Sensorium: intact Insight: good Judgment: good  Provisional DSM-5 diagnosis(es):  F89 Unspecified Disorder of Psychological Development   Plan: Testing is expected to answer the question, does the individual meet criteria for ADHD when age, other mental health concerns (e.g, anxiety, depression, and autism spectrum disorder), and cognitive functioning are taken into consideration? Further testing is  warranted because a diagnosis cannot be given solely based on current interview data (further data is required). Testing results are expected to answer the remaining diagnostic questions in order to provide an accurate diagnosis and assist in treatment planning with an expectation of improved clinical outcome. Sean is currently scheduled for an appointment on 10/23/2023 at 10am via Caregility video visit with audio.  CONFIDENTIAL PSYCHOLOGICAL EVALUATION ______________________________________________________________________________ Name: Tony Jennings Date of Birth: 08/05/1980    Age: 43  SOURCE AND REASON FOR REFERRAL: Tony Jennings was referred by Dr. Seabron Spates for an evaluation to ascertain if he meets the criteria for Attention Deficit/Hyperactivity Disorder (ADHD).    BACKGROUND INFORMATION AND PRESENTING PROBLEM: Tony Jennings is a 43 year old male who resides in [place].  Tony Jennings reported with the "increased awareness" about "neurodivergency" he and his wife became concerned he may have ADHD. He endorsed the following ADHD symptoms:  Symptoms Frequency  Often Occasionally Infrequent/No Significant Issues Inattention Criterion (DSM-5-TR)    Making careless mistakes and missing small details.  He described how this commonly occurs with tasks he does not find stimulating and that forgetfulness contributes to it.    Being easily distracted by various stimuli and the mind often being elsewhere even when no apparent distractions exist.  He shared that various stimuli distract him (e.g., irrelevant tasks and thoughts and sounds that he "does not have control over"), and that he is often distracted even when there are no obvious distractions.    Trouble sustaining attention during conversations.  He stated how what others are saying commonly "triggers a thought" that he fuses to, which leads to him missing parts of the conversation despite efforts to sustain  his attention.    Does not follow through on instructions and fails to finish tasks.  He described regularly experiencing task maintenance issues and meeting deadlines, noting how trouble planning and forgetfulness leads to him not making plans in an effort to avoid "letting someone down."   Difficulty organizing tasks and activities.  He shared regularly having trouble planning, meeting deadlines, and completing tasks in a haphazard manner.     Avoids, dislikes, or is reluctant to engage in tasks that require sustained mental effort. He stated he seemingly "intentionally" puts off tasks until near deadlines as urgency is a primary motivator for task initiation and to complete tasks, especially if they are tasks he is not interested in or require sustained effort.    Loses things necessary for tasks or activities.  He shared that he misplaces items or forgets needed items for tasks "all the time."    Forgetful in daily activities.  He noted how he regularly forgets plans.    Hyperactivity and Impulsivity Criterion (DSM-5-TR)    Fidgets with hands or feet or squirms in seat. He described that he habitually, and largely unconsciously, fidgets (e.g., plays with items near him, taps his fingers, and bounces his legs), adding that it takes significant effort to "sit still."    Leaves seat in situations in which remaining seated is expected. He stated he often "feel[s] trapped" when he has to stay seated, and that fidgeting helps with sustaining his attention while required to stay seated.   Feelings of restlessness. He described himself as commonly feeling agitated and on eduge.    Difficulty playing or engaging in leisure activities quietly.   He described commonly having urges to talk but that he does not have significant issues with this symptom.  "On the go" or often acts as if "driven by a motor." He reported he "always" has to be "doing something," which contributes to a hard time staying seated. He  further reported it is "getting worse as [he] get[s] older" and that he tends to do other things (e.g., play on his phone or shuffle cards) while watching TV.    Talks excessively.  He stated he  is prone to providing excessive details while talking, adding that he "can't stop [himself] and that others have commented on it. He described how trouble prioritizing information and details appears to contribute to the symptom.    Interrupts others.  He shared he often interrupts others and he "do[es not want to wait for them to finish" because he "want[s] to talk about what they said right then," and he has concerns the topic will change or he will forget what he wants to say.    Impatience.  He discussed how he often experiences impatience, noting examples of becoming "irrationally angry" if others are not to drive shortly after a traffic light "turns green" or her perceives someone as doing something incorrectly which seemingly contributes to a wait. He also discussed how he tends to "rush things."    ADHD-Related Symptoms that Assist in Identifying ADHD but are not in the DSM-5-TR Tony Jennings, 2021, p. 6-12 and [phone])    Emotional dysregulation and overstimulation. He described being prone to impatience, irritability, and anger. He further described how it can be "infuritating" when someone breaks his attention from a task and it is "difficult to get back to where [he] was" and becoming "super super anxious" if "sounds outside [his] control" are occurring or if multiple people are trying to talk to him at one time.    Making decisions impulsively. He shared how he is prone to "saying" or "doing" "whatever is top of mind," which leads to regrets.    Tending to drive much faster than others.   He noted having a history of driving much faster than others that resulted in multiple speeding tickets, but after getting "like five tickets in a six month period" he has reduced his speed.  Trouble following through on  promises or commitments made to others.  He described how regularly having trouble following through on promises or commitments has led to him becoming "noncommittal" to "avoid issues." He further described how he puts significant effort into following through on his employment tasks but that it generally takes "all of [his] energy."    Trouble doing things in proper order or sequence. He stated this is especially likely to occur if it is a "new process" or "doesn't make sense in [his] brain."     Tony Jennings discussed a history of autism spectrum disorder-related symptomatology (e.g., describing small talk as "seem[ing] pointless" and "[not] see[ing] the value in it," trouble maintaining relationships, difficulty initiating conversations, pain insensitivity, sensitivity to sounds that "can be a bit debilitating," and being "very rigid" with routines and becoming upset if they are "deviated from," adding a belief that social anxiety and inattention contribute to his dislike of small talk and "not find[ing] it stimulating;" depressive episodes throughout life, stating there is a "steady kind of hum of hopelessness, anxiety, and depression" and that these episodes include fatigue, exacerbated agitation, irritability, and reduced sleep; generalized anxiety and "health anxiety" that can be "pretty intense," "crippling," and hard to manage, which negatively impacts his sleep and daily functioning; social anxiety-related symptoms (e.g., discomfort in social settings which contributed to use of alcohol to reduce this discomfort or efforts to avoid social situations); obsessions and compulsions (e.g., experiencing significant distress if tasks are not done in a certain way and feeling compelled to fix the perceived issue, having special numbers, and "need[ing] to make sure" he does certain tasks with a certain "beat" or it "feels incomplete" or "off"); sleep onset and maintenance issues as well as generally  not feeling  rested upon waking that he attributed to "really bad anxiety about going to sleep" because he "feel[s] like [his] brain couldn't be turned off" and he "hate[s] being awake in the silence," which previously contributed to alcohol use to assist with sleep onset and current use of a prescribed sleep aid; fluctuating between "forget[ting] to eat all day" and then "overeat[ing] because [he became aware he is] hungry," adding he is prone to eating until uncomfortably full and has occasional periods of emotional eating-related behaviors (e.g., eating when "bored" but not physically hungry); an instance of a visual hallucination that he indicated may have occurred secondary to "heavy substance use;" a "couple of [traumatic] events" with "some" being "somewhere between [the ages of 22 to 69" and another "event in 2020 that he noted have contributed "quiet hums of awareness" and unspecified "perception changes;" and suicidal ideation with plan or intent as a "child" and during periods of substance use. He described his ADHD symptoms as occurring since childhood, as independent of mood, and consistent across situations but that some symptoms have "gotten worse or [he has become] more aware of them" in recent years. He further described how efforts to not act on various urges and feelings of overwhelm can exacerbate his ADHD symptoms. He stated his coping and compensatory strategies include listening to music to assist with sustaining his attention on tasks, automating various tasks, utilizing reminders and meditation, and efforts to limit distractors.  Tony Jennings denied awareness of having ever experienced any developmental milestone delays, grade retention, learning disability diagnosis, or having an individualized education plan. He discussed in primary and secondary schooling, his grades were "always good," but that he struggled in mathematics classes that required him to "show all of [his] work" as "most of the work was  done in [his] head," adding that he would "get the right answer" but being unable to show his work led to his teacher being concerned he was "cheating." He also discussed being prone to completing school assignments last minute; doodling during class to assist with sustaining his attention;" and "always [getting in] trouble for not paying attention," "fidgeting," "acting out," and "pushing back" against teachers. He expressed a belief had he not had natural academic abilities, the aforementioned would have led to him getting "Cs and Ds." He shared that he "struggled" in college as he "did not know how to study," had trouble sustaining his attention in classes and adjusting to the reduced structure of college and that he was prone to "tank[ing]" classes he was not interested in and/or that required studying, noting how this contributed to him "not going to class" and "dropping out" of college after two years. 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 He described his first marriage as "not go[ing] so well," which he attributed to him and his ex-wife not understanding "each other's idiosyncrasies."   Tony Jennings reported his medical history is significant for a "handful of concussions" starting at the age of 73. He denied awareness of experiencing lingering effects from the head injuries or seizures. He shared that he has been "in and out of therapy" since being 20 or 43 years old for an unspecified familial stressor, "super bad anxiety and panic attacks," which "also led to pretty significant depression" and "substance use."  He further shared past use of psychotropic medication (e.g., Lexapro and Paxil) but that he had a "ton of bad side effects" which "turned [him]  off" on continuing to utilize psychotropic medication. He reported prior problematic alcohol use that included drinking until "the point of passing out" but that he ceased use of alcohol seven years ago and "do[es not] long for it or miss it anymore;" ceased use of tobacco five or six years ago; use of 5-8mg  of THC to assist with sleep onset; a remote history of a "variety" of unspecified substances" with "some of it being problematic, and use of approximately 36oz of coffee daily. He denied awareness of any lingering effects from past substance use or issues occurring from his current substance use. He endorsed his legal history as significant for an arrest for "possession of a controlled substance and possession of a controlled substance with intent to distribute" that occurred when he was a "minor." He denied ever experiencing psychiatric hospitalization or ever meeting the full criteria for hypomanic or manic episode; or homicidal ideation, plan, or intent. He stated his familial mental health history is significant for possible ADHD as their behaviors are "a lot alike" (mother).   Chart Review: On 07/29/2023, Tony Jennings sent a message to his medical care team stating, "Hi Tony Jennings, Based on her own evaluation and diagnosis experience, my wife believes I might have ADHD or a similar neurodivergence. The more we've talked about it, the more I think she might be correct. Is it be possible for you to give me referral or recommendation for an evaluation? Any direction you can offer is greatly appreciated." Kaylyn Canter CMA responded stating, "Good afternoon, Tony Jennings has sent a referral for you. They should be in contact in the next several days to schedule an appointment."               Veronica Gordon, PsyD

## 2023-10-23 ENCOUNTER — Ambulatory Visit: Admitting: Psychology

## 2023-10-23 DIAGNOSIS — F89 Unspecified disorder of psychological development: Secondary | ICD-10-CM

## 2023-10-23 NOTE — Progress Notes (Signed)
 Date: 10/23/2023 Appointment Start Time: 10am Duration: 40 minutes Provider: Maryetta Sneddon, PsyD Type of Session: Testing Appointment for Evaluation  Location of Patient: Home Location of Provider: Provider's Home (private office) Type of Contact: Caregility video visit with audio  Session Content: Today's appointment was a telepsychological visit. Tony Jennings is aware it is his responsibility to secure confidentiality on his end of the session. Prior to proceeding with today's appointment, [name]'s physical location at the time of this appointment was obtained as well a phone number he could be reached at in the event of technical difficulties. Severn denied anyone else being present in the room or on the virtual appointment. This provider reviewed that video should not be captured, photos should not be taken, nor should testing stimuli be copied or recorded as it would be a copyright violation. [name] expressed understanding of the aforementioned, and verbally consented to proceed. The WAIS-5 was administered, scored, and interpreted by this evaluator. [name] is aware of the limitations of teleheath visits and verbally consented to proceed.  Billing codes will be input on the feedback appointment. There are no billing codes for the testing appointment.   Provisional DSM-5 diagnosis(es):  F89 Unspecified Disorder of Psychological Development   Plan: [name] was scheduled for a feedback appointment on 11/05/2023 at 12pm via Caregility video visit with audio.              Veronica Gordon, PsyD

## 2023-11-05 ENCOUNTER — Ambulatory Visit: Admitting: Psychology

## 2023-11-05 DIAGNOSIS — F902 Attention-deficit hyperactivity disorder, combined type: Secondary | ICD-10-CM | POA: Diagnosis not present

## 2023-11-05 NOTE — Progress Notes (Signed)
 Testing and Report Writing Information: The following measures  were administered, scored, and interpreted by this provider:  Generalized Anxiety Disorder-7 (GAD-7; 5 minutes), Patient Health Questionnaire-9 (PHQ-9; 5 minutes), Wechsler Adult Intelligence Scale-Fourth Edition (WAIS-5; 70 minutes), CNS Vital Signs (45 minutes), Adult Attention Deficit/Hyperactivity Disorder Self-Report Scale Checklist (ASRSv1.1; 15 minutes), Behavior Rating Inventory for Executive Function - 2A - Self Report (BRIEF 2A; 10 minutes) and Behavior Rating Inventory for Executive Function - 2A - Informant (BRIEF-2A; 10 minutes), Adult OCD Inventory (OCD-A) SF-20 (15 minutes) and Personality Assessment Inventory (PAI; 50 minutes). A total of 225 minutes was spent on the administration and scoring of the aforementioned measures. Codes 32202 and [phone] (6 units) were billed.  Please see the assessment for additional details. This provider completed the written report which includes integration of patient data, interpretation of standardized test results, interpretation of clinical data, review of information provided by Susana Enter and any collateral information/documentation, and clinical decision making (335 minutes in total).  Feedback Appointment: Date: 11/05/2023 Appointment Start Time: 1pm Duration: Provider: Maryetta Sneddon, PsyD Type of Session: Feedback Appointment for Evaluation  Location of Patient: Home Location of Provider: Provider's Home (private offi ce) Type of Contact: Caregility video visit with audio  Session Content: Today's appointment was a telepsychological visit due to COVID-19. Masen is aware it is his responsibility to secure confidentiality on his end of the session. He provided verbal consent to proceed with today's appointment. Prior to proceeding with today's appointment, Remy's physical location at the time of this appointment was obtained as well a phone number he could be reached at in  the event of technical difficulties. Syrus denied anyone else being present in the room or on the virtual appointment. Prateek is aware of the limitations of teleheath visits and verbally consented to proceed.  This provider and Susana Enter completed the interactive feedback session which includes reviewing the aforementioned measures, treatment recommendations, and diagnostic conclusions.   The interactive feedback session was completed today and a total of 57 minutes was spent on feedback. Code 62376 was billed for feedback session.   DSM-5 Diagnosis(es):  F90.2 Attention-Deficit/Hyperactivity Disorder, Combined Presentation, Moderate  Time Requirements: Assessment scoring and interpreting: 225 minutes (billing code 28315 and [phone] [6 units]) Feedback: 57 minutes (billing code 07371) Report writing: 340 total minutes. 10/16/2023: 8:10-8:25pm. (inputting chart review information into the evaluation). 10/19/2023: 6:55-7:45pm. 10/20/2023: 1:30-2:25pm. 10/21/2023: 4:20-4:45pm. 10/22/2023: 11:25-12am. 10/27/2023: 5:15-5:50pm. 10/31/2023: 5-5:55pm. 11/02/2023: 11:50-11:55, 12-12:40pm, and 1:05-1:25pm. 11/04/2023: 4:15-4:20pm. (billing code [id number] [6 units])  Plan: Susana Enter provided verbal consent for his evaluation to be sent via e-mail. No further follow-up planned by this provider.      CONFIDENTIAL PSYCHOLOGICAL EVALUATION ______________________________________________________________________________ Name: Tony Jennings Date of Birth: 1981-05-21    Age: 43 Dates of Evaluation: 10/15/2023, 10/18/2023, 10/19/2023, 10/20/2023, and 10/23/2023   SOURCE AND REASON FOR REFERRAL: Tony Jennings was referred by Dr. Roel Clarity for an evaluation to ascertain if he meets the criteria for Attention Deficit/Hyperactivity Disorder (ADHD).   EVALUATIVE PROCEDURES: Clinical Interview with [name] (10/15/2023) Wechsler Adult Intelligence Scale-Fourth Edition (WAIS-5; 10/23/2023) CNS Vital Signs  (10/19/2023) Adult Attention Deficit/Hyperactivity Disorder Self-Report Scale Checklist (10/19/2023)  Behavior Rating Inventory for Executive Function - A - Self Report Behavior Rating Inventory for Executive Function - 2A - Self Report (BRIEF-2A; 10/18/2023) and Informant (10/20/2023) Personality Assessment Inventory (PAI; 10/18/2023) Patient Health Questionnaire-9 (PHQ-9) Generalized Anxiety Disorder-7 (GAD-7) Adult OCD Inventory (OCD-A) SF-20 (10/19/2023)   BACKGROUND INFORMATION AND PRESENTING PROBLEM: Tony Jennings is a 43 year old  male who resides in [place] .  Mr. Hirabayashi reported with the "increased awareness" about "neurodivergency," he and his wife became concerned that he may have ADHD. He endorsed the following ADHD symptoms:  Symptoms Frequency   Often Occasionally Infrequent/No Significant Issues  Inattention Criterion (DSM-5-TR)     Making careless mistakes and missing small details.  He described this commonly occurs with tasks he does not find stimulating and that forgetfulness contributes to it.     Being easily distracted by various stimuli and the mind often being elsewhere even when no apparent distractions exist.  He shared that various stimuli distract him (e.g., irrelevant tasks and thoughts and sounds that he "do[es] not have control over"), and that he is often distracted even when there are no obvious distractions.     Trouble sustaining attention during conversations.  He stated what others are saying commonly "triggers a thought" that he fuses to, which leads to him missing parts of the conversation despite efforts to sustain his attention.     Does not follow through on instructions and fails to finish tasks.  He described regularly experiencing task maintenance issues and meeting deadlines, noting trouble planning and forgetfulness leads to him not making plans in an effort to avoid "letting someone down."    Difficulty organizing tasks and activities.  He  shared regularly having trouble planning, meeting deadlines, and completing tasks in a haphazard manner.      Avoids, dislikes, or is reluctant to engage in tasks that require sustained mental effort. He stated he seemingly "intentionally" puts off tasks until near deadlines as urgency is a primary motivator for task initiation and to complete tasks, especially if they are tasks he is not interested in or require sustained effort.     Loses things necessary for tasks or activities.  He shared that he misplaces items or forgets needed items for tasks "all the time."     Forgetful in daily activities.  He noted he regularly forgets plans.     Hyperactivity and Impulsivity Criterion (DSM-5-TR)     Fidgets with hands or feet or squirms in seat. He described that he habitually, and largely unconsciously, fidgets (e.g., plays with items near him, taps his fingers, and bounces his legs), adding that it takes significant effort to "sit still."     Leaves seat in situations in which remaining seated is expected. He stated he often "feel[s] trapped" when he has to stay seated, and that fidgeting helps with sustaining his attention while required to stay seated.    Feelings of restlessness. He described himself as commonly feeling agitated and on edge.     Difficulty playing or engaging in leisure activities quietly.   He described commonly having urges to talk but that he does not have significant issues with this symptom.   "On the go" or often acts as if "driven by a motor." He reported he "always" has to be "doing something," which contributes to a hard time staying seated. He further reported it is "getting worse as [he] get[s] older" and that he tends to do other things (e.g., play on his phone or shuffle cards) while watching TV.     Talks excessively.  He stated he is prone to providing excessive details while talking, adding that he "can't stop [himself]" and that others have commented on it. He described  trouble prioritizing information and details appears to contribute to the symptom.     Interrupts others.  He shared he often interrupts  others and he "do[es] not want to wait for them to finish" because he "want[s] to talk about what they said right then," and he has concerns the topic will change or he will forget what he wants to say.     Impatience.  He discussed he often experiences impatience, noting examples of becoming "irrationally angry" if others are not to drive shortly after a traffic light "turns green" or her perceives someone as doing something incorrectly which seemingly contributes to a wait. He also discussed he tends to "rush things."     ADHD-Related Symptoms that Assist in Identifying ADHD but are not in the DSM-5-TR Arvil Birks, 2021, p. 6-12 and [phone])     Emotional dysregulation and overstimulation. He described being prone to impatience, irritability, and anger. He further described it can be "infuriating" when someone breaks his attention from a task and it is "difficult to get back to where [he] was" and becoming "super, super anxious" if "sounds outside [his] control" are occurring or if multiple people are trying to talk to him at one time.     Making decisions impulsively. He shared he is prone to "saying" or "doing" "whatever is top of mind," which leads to regrets.     Tending to drive much faster than others.   He noted having a history of driving much faster than others that resulted in multiple speeding tickets, but after receiving "like five tickets in a six month period" he has reduced his speed.   Trouble following through on promises or commitments made to others.  He described regularly having trouble following through on promises or commitments has led to him becoming "noncommittal" to "avoid issues." He further described he puts significant effort into following through on his employment tasks but that it generally takes "all of [his] energy."     Trouble doing  things in proper order or sequence. He stated this is especially likely to occur if it is a "new process" or "doesn't make sense in [his] brain."      Mr. Zidek discussed a history of autism spectrum disorder-related symptomatology (e.g., describing small talk as "seem[ing] pointless" and "[not] see[ing] the value in it," trouble maintaining relationships, difficulty initiating conversations, pain insensitivity, sensitivity to sounds that "can be a bit debilitating," and being "very rigid" with routines and becoming upset if they are "deviated from," adding a belief that social anxiety and inattention contribute to his dislike of small talk and "not find[ing] it stimulating"); depressive episodes throughout life, stating there is a "steady kind of hum of hopelessness, anxiety, and depression" and that these episodes include fatigue, exacerbated agitation, irritability, and reduced sleep; generalized anxiety and "health anxiety" that can be "pretty intense," "crippling," and hard to manage, which negatively impacts his sleep and daily functioning; social anxiety-related symptoms (e.g., discomfort in social settings which contributed to use of alcohol  to reduce this discomfort or efforts to avoid social situations); obsessions and compulsions (e.g., experiencing significant distress if tasks are not done in a certain way and feeling compelled to fix the perceived issue, having special numbers, and "need[ing] to make sure" he does certain tasks with a certain "beat" or it "feels incomplete" or "off"); sleep onset and maintenance issues as well as generally not feeling rested upon waking that he attributed to "really bad anxiety about going to sleep" because he "feel[s] like [his] brain couldn't be turned off" and he "hate[s] being awake in the silence," which previously contributed to alcohol  use to assist with sleep  onset and current use of a prescribed sleep aid; fluctuating between "forget[ting] to eat all day"  and then "overeat[ing] because [he became aware he is] hungry," adding he is prone to eating until uncomfortably full and has occasional periods of emotional eating-related behaviors (e.g., eating when "bored" but not physically hungry); an instance of a visual hallucination that he indicated may have occurred secondary to "heavy substance use;" a "couple of [traumatic] events" with "some" being "somewhere between the ages of 54 to 54" and another "event in 2020" that he noted have contributed "quiet hums of awareness" and unspecified "perception changes;" and suicidal ideation with plan or intent as a "child" and during periods of substance use. He described his ADHD symptoms as occurring since childhood, as independent of mood, and consistent across situations but that some symptoms have "gotten worse or [he has become] more aware of them" in recent years. Mr. Kurowski further described efforts to not act on various urges and feelings of overwhelm can exacerbate his ADHD symptoms. He stated his coping and compensatory strategies include listening to music to assist with sustaining his attention on tasks, automating various tasks, utilizing reminders and meditation, and efforts to limit distractors.  Mr. Turin denied awareness of having ever experienced any developmental milestone delays, grade retention, learning disability diagnosis, or having an individualized education plan. He discussed in primary and secondary schooling, his grades were "always good," but that he struggled in mathematics classes that required him to "show all of [his] work" as "most of the work was done in [his] head," adding that he would "get the right answer" but being unable to show his work led to his teacher being concerned he was "cheating." He also discussed being prone to completing school assignments last minute; doodling during class to assist with sustaining his attention;" and "always [getting in] trouble for not paying  attention," "fidgeting," "acting out," and "pushing back" against teachers. He expressed a belief had he not had natural academic abilities, the aforementioned would have led to him getting "Cs and Ds." He shared that he "struggled" in college as he "did not know how to study," had trouble sustaining his attention in classes and adjusting to the reduced structure of college and that he was prone to "tank[ing]" classes he was not interested in and/or that required studying, noting this contributed to him "not going to class" and "dropping out" of college after two years. 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 He described his first marriage as "not go[ing] so well," which he attributed to him and his ex-wife not understanding "each others' idiosyncrasies."   Mr. Douyon reported his medical history is significant for a "handful of concussions" starting at the age of 41. He denied awareness of experiencing lingering effects from the head injuries or seizures. He shared that he has been "in and out of therapy" since being 37 or 44 years old for an unspecified familial stressor, "super bad anxiety and panic attacks," which "also led to pretty significant depression" and "substance use." He further shared past use of psychotropic medication (e.g., Lexapro  and Paxil), but that he had a "ton of bad side effects" which "turned [him] off" on continuing to utilize psychotropic medication. He reported prior problematic alcohol  use that included drinking until "the point of  passing out" but that he ceased use of alcohol  seven years ago and "do[es not] long for it or miss it anymore;" ceased use of tobacco five or six years ago; use of 5-8mg  of THC to assist with sleep onset; a  remote history of a "variety" of unspecified substances" with "some of it being problematic," and use of approximately 36oz of coffee daily. He denied awareness of any lingering effects from past substance use or issues occurring from his current substance use. He endorsed his legal history as significant for an arrest for "possession of a controlled substance and possession of a controlled substance with intent to distribute" that occurred when he was a "minor." He denied ever experiencing psychiatric hospitalization or ever meeting the full criteria for hypomanic or manic episode; or homicidal ideation, plan, or intent. He stated his familial mental health history is significant for possible ADHD, as their behaviors are "a lot alike" (mother).   Chart Review: On 07/29/2023, Mr. Falkner sent a message to his medical care team stating, "Hi Dr. Jalene Mayor, Based on her own evaluation and diagnosis experience, my wife believes I might have ADHD or a similar neurodivergence. The more we've talked about it, the more I think she might be correct. Is it be possible for you to give me referral or recommendation for an evaluation? Any direction you can offer is greatly appreciated." Kaylyn Canter CMA responded stating, "Good afternoon, Dr. Jalene Mayor has sent a referral for you. They should be in contact in the next several days to schedule an appointment."   BEHAVIORAL OBSERVATIONS: Mr. Decaprio presented on time for the evaluation. He was well-groomed. He was oriented to the time, place, person, and purpose of the appointment. During the interview, Mr. Kowal regularly fidgeted (e.g., shuffled cards) and had fast and pressured speech. During the evaluation, Mr. Mara often fidgeted (e.g., shuffling a deck of cards) and he verbalized and/or demonstrated occasional self-doubt (e.g., saying "I don't know" or "I guess" before and/or after having provided a correct response) and self-expression difficulties (e.g., stating "How do I  even articulate that?"). Throughout the evaluation, he maintained appropriate eye contact. His thought processes and content were logical, coherent, and goal-directed. There were no overt signs of a thought disorder or perceptual disturbances, nor did he report such symptomatology. There was no evidence of paraphasias (i.e., errors in speech, gross mispronunciations, and word substitutions), repetition deficits, or disturbances in volume or prosody (i.e., rhythm and intonation). Overall, based on Mr. Garraway's approach to testing, the current results are believed to be a good estimate of his abilities.  PROCEDURAL CONSIDERATIONS: Psychological testing measures were conducted through a virtual visit with video and audio capabilities, but otherwise in a standard manner.   The Wechsler Adult Intelligence Scale, Fifth Edition (WAIS-5) was administered via remote telepractice using digital stimulus materials on [employer]'s Q-global system. The remote testing environment appeared free of distractions, adequate rapport was established with the examinee via video/audio capabilities, and Mr. Dastrup appeared appropriately engaged in the task throughout the session. No significant technological problems or distractions were noted during administration. Modifications to the standardization procedure included: none. The WAIS-5 subtests, or similar tasks, have received initial validation in several samples for remote telepractice and digital format administration, and the results are considered a valid description of Mr. Muska' skills and abilities.  CLINICAL FINDINGS:  COGNITIVE FUNCTIONING  Wechsler Adult Intelligence Scale, Fourth Edition (WAIS-5): Mr. Plunkett completed subtests of the WAIS-5, a full-scale measure of cognitive ability. The WAIS-5 comprises four indices  that measure cognitive processes that are components of intellectual ability; however, only subtests from the Verbal Comprehension and Working Memory  indices were administered. As a result, Full-Scale-IQ (FSIQ) and General Ability Index (GAI) could not be determined.   WAIS-5 Scale/Subtest Composite Score/Scaled Score 95% Confidence Interval Percentile Rank Qualitative Description  Verbal Comprehension (VCI) 127 118-132 96 Very High  Similarities 16     Vocabulary 14     Working Memory (WMI) 106 98-113 66 Average  Digit Sequencing  7     Running Digits 15     Digits Forward 12       The Verbal Comprehension Index (VCI) measures one's ability to receive, comprehend, and express language. It also measures the ability to retrieve previously learned information and to understand relationships between words and concepts presented orally. Mr. Blase obtained a VCI score of 127 (96th percentile), placing him in the very high range compared to same-aged peers. His performance on the subtests comprising this index was comparable, which suggests his verbal reasoning abilities are similarly developed.   The Working Memory Index (WMI) measures one's ability to sustain attention, concentrate, and exert mental control. Mr. Groening obtained a WMI score of 106 (66th percentile), placing him in the average range compared to same-aged peers. His best performance was on the Running Digits subtest, and his lowest performance was on Digit Sequencing. This significant discrepancy may indicate he has stronger maintenance, refreshing, and/or updating skills relative to mental manipulation of the material.   ATTENTION AND PROCESSING  CNS Vital Signs: The CNS Vital Signs assessment evaluates the neurocognitive status of an individual and covers a range of mental processes. The results of the CNS Vital Signs testing indicated average neurocognitive processing ability. Attentional abilities varied, with simple attention in the very low range, complex attention in the low average range, and sustained attention in the average range. Executive function and cognitive  flexibility were average. Working memory was average. Psychomotor speed and motor speed were above. Processing speed was average. Reaction time was very low. Visual memory (images) was above, and verbal memory (words) was low average, which indicates visual memory is a strength. The results suggest Mr. Apgar experiences impairment in reaction time and simple attention; weakness in verbal memory and complex attention; and strengths in visual memory, psychomotor speed, and motor speed. Upon follow-up, he reported he may have "botched an entire portion" of the CNS Vital Signs as he "thought it was still an example." He indicated that it may have been the first part of the CPT, and it appears that his results for that portion of the CNS Vital Signs were not reported.   Domain  Standard Score Percentile Validity Indicator Guideline  Neurocognitive Index 91 27 Yes Average  Composite Memory 108 70 Yes Average  Verbal Memory 89 23 Yes Low Average  Visual Memory 122 93 Yes Above  Psychomotor Speed 112 79 Yes Above  Reaction Time 57 1 Yes Very Low  Complex Attention 85 16 Yes Low Average  Cognitive Flexibility 91 27 Yes Average  Processing Speed  101 53 Yes Average  Executive Function 91 27 Yes Average  Working Memory 103 58 Yes Average  Sustained Attention 102 55 Yes Average  Simple Attention 45 1 Yes Very Low  Motor Speed 115 84 Yes Above   EXECUTIVE FUNCTION Behavior Rating Inventory of Executive Function, Second Edition Event organiser) Self-Report:  Ms. Hashimoto completed the Self-Report Form of the Behavior Rating Inventory of Executive Function-Adult Version, Second Edition (BRIEF-2A), which has  three domains that evaluate cognitive, behavioral, and emotional regulation, and a Global Executive Composite score provides an overall snapshot of executive functioning. There are no missing item responses in the protocol.  The Negativity, Infrequency, and Inconsistency scales are not elevated, suggesting he did  not respond to the protocol in an overly negative, haphazard, extreme, or inconsistent manner. In the context of these validity considerations, the ratings of Ms. Kirkeby' everyday executive function suggest some areas of concern. The overall index score, the GEC, was highly elevated (GEC T = 84, %ile = >99). The Behavior Regulation Index (BRI), Emotion Regulation Index (ERI), and Cognitive Regulation Index (CRI) scores were all elevated (BRI T = 77, %ile = >99; ERI T = 86, %ile = >99, CRI T = 82, %ile = >99), suggesting self-regulatory problems in all measured domains. Mr. Tata indicated difficulty with his ability to resist impulses, be aware of his functioning in social settings, adjust well to changes, react to events appropriately, get going on tasks and activities and independently generate ideas, sustain working memory, plan and organize his approach to problem solving appropriately, be appropriately cautious in their approach to tasks and check for mistakes, and keep materials and belongings reasonably well-organized. The elevated scores on the Shift and Emotional Control scales suggest he experiences significant problem-solving rigidity and emotional dysregulation, which may leave him prone to losing emotional control when her routine or perspective is challenged and/or flexibility is required. Moreover, the elevated scores on scales reflecting problems with fundamental behavioral and/or emotional regulation (Inhibit, Emotional Control, and Shift) suggest that more global problems with self-regulation negatively affect active cognitive problem solving (elevated CRI).  Scale/Index  Raw Score T Score Percentile Qualitative Description  Inhibit 18 77 >99 Highly Elevated  Self-Monitor 12 70 98 Moderately Elevated  Behavior Regulation Index (BRI) 30 77 >99 Highly Elevated  Shift 15 80 >99 Highly Elevated  Emotional Control 21 83 99 Highly Elevated  Emotion Regulation Index (ERI) 36 86 >99 Highly  Elevated  Initiate 16 67 95 Mildly Elevated  Working Memory 20 82 >99 Highly Elevated  Plan/Organize 21 83 >99 Highly Elevated  Task Monitor 12 68 99 Mildly Elevated  Organization of Materials 23 85 98 Highly Elevated  Cognitive Regulation Index (CRI) 92 82 >99 Highly Elevated  Global Executive Composite (GEC) 158 84 >99 Highly Elevated   Validity Scale Raw Score Cumulative Percentile Protocol Classification  Inconsistency 4 98 Acceptable  Negativity 1 98 Acceptable  Infrequency 0 98 Acceptable   Behavior Rating Inventory of Executive Function, Second Edition [employer]) Informant:  Mr. Sparta' spouse, Ms. Amanda Malugin, completed the Informant Form of the Behavior Rating Inventory of Executive Function-Adult Version, Second Edition [employer]), which is equivalent to the Self-Report version and has three domains that evaluate cognitive, behavioral, and emotional regulation, and a Global Executive Composite score provides an overall snapshot of executive functioning. There are no missing item responses in the protocol. The Negativity and Infrequency scales are not elevated, suggesting she did not respond to the protocol in an overly negative, haphazard, or extreme manner. However, the Inconsistency scale score is elevated, suggesting that ratings on the scales may not be internally consistent and that validity may be compromised. In the context of these validity considerations, Ms. Malugin's ratings of Mr. Nellum' everyday executive function suggest some areas of concern. The overall index score, the GEC, was highly elevated (GEC T = 81, %ile = >99). The Behavior Regulation Index (BRI) score was within normal limits (BRI T = 63, %ile =  89), but the Emotion Regulation Index (ERI) score was highly elevated (ERI T = 75, %ile = 98) and the Cognitive Regulation Index (CRI) score was highly elevated (CRI T = 84, %ile = >99). Ms. Malugin indicated Mr. Parr experiences difficulty adjusting well to changes,  react to events appropriately, get going on tasks and activities and independently generate ideas, sustain working memory, plan and organize their approach to problem solving appropriately, and keep materials and belongings reasonably well-organized. She did not describe his ability to resist impulses, be aware of their functioning in social settings, and be appropriately cautious in their approach to tasks and check for mistakes as problematic. However, the Inhibit and Task Monitor scales approached an abnormal elevation. The elevated Shift and Emotional Control scales' scores suggest significant problem-solving rigidity combined with emotional dysregulation. Individuals with this profile tend to lose emotional control when their routines or perspectives are challenged or when flexibility is required. Moreover, the elevated scores on the Plan/Organize, Working Memory, Psychologist, sport and exercise, and Initiate scales suggest generalized difficulty with organized problem-solving, which results in becoming overwhelmed with complex task demands. This may result in him being prone to shutting down and demonstrating poor task initiation. Upon follow-up, Mr. Kaup described how Ms. Malugin noted it was difficult to "separate [him] from [his] professional life" versus "what she personally knows about [him]," and that the questions did not "separate that out."    Scale/Index  Raw Score T Score Percentile Qualitative Description  Inhibit 14 63 92 Approaching an Elevation  Self-Monitor 10 58 80 Within Normal Limits  Behavior Regulation Index (BRI) 24 63 89 Approaching an Elevation  Shift 14 69 99 Mildly Elevated  Emotional Control 19 71 95 Moderately Elevated  Emotion Regulation Index (ERI) 33 75 98 Highly Elevated  Initiate 19 77 >99 Highly Elevated  Working Memory 20 82 >99 Highly Elevated  Plan/Organize 21 83 >99 Highly Elevated  Task-Monitor 12 64 99 Approaching an Elevation  Organization of Materials 21 75 98  Highly Elevated  Cognitive Regulation Index (CRI) 93 84 >99 Highly Elevated  Global Executive Composite (GEC) 150 81 >99 Highly Elevated   Validity Scale Raw Score Cumulative Percentile Protocol Classification  Inconsistency 8 >99 Inconsistent  Negativity 0 98 Acceptable  Infrequency 0 98 Acceptable   BEHAVIORAL FUNCTIONING   Patient Health Questionnaire-9 (PHQ-9): Mr. Briner completed the PHQ-9, a self-report measure that assesses symptoms of depression. He scored 13/27, which indicates moderate depression.   Generalized Anxiety Disorder-7 (GAD-7): Mr. Bault completed the GAD-7, a self-report measure that assesses symptoms of anxiety. He scored 18/21, which indicates severe anxiety.   Adult ADHD Self-Report Scale Symptom Checklist (ASRS): Mr. Braaten reported the following symptoms as sometimes: feeling overly active and compelled to do things, leaving his seat when expected to stay seated, and talking too much in social situations. He endorsed the following symptoms as occurring often: difficulty wrapping up the final details of a project following the completion of challenging aspects, difficulty getting things in order when a task requires organization, difficulty waiting for his turn in turn-taking situations, and interrupting others when they are busy. He endorsed the following symptoms as very often: problems remembering appointments or obligations, avoiding or delaying getting started on tasks requiring a lot of thought, fidgeting or squirming, making careless mistakes when working on boring or complex projects, struggling to sustain attention when doing boring or repetitive work, struggling to concentrate on what people say even when they are speaking directly to him, misplacing or has difficulty  finding things, being distracted by the noise around him, feeling restless or fidgety, difficulty relaxing, and interrupting others or finishing their sentences. The endorsement of at least four  items in Part A is highly consistent with ADHD in adults. The frequency scores of Part B provide additional cues. Mr. Mcquistion scored 5/6 on Part A and 11/12 on Part B, which is considered a positive screening for ADHD.   Adult OCD Inventory (OCD-A) SF-20: The OCD-A SF-20 was administered. Mr. Clotfelter scored 120/300, which is indicative of mild problems with OCD-related concerns. He endorsed the following as "Yes, this stops me a little or wastes a little of my time": having a special number, worrying about being clean, arranging things in certain ways or symmetrically, having to put things away just right, getting angry if others mess up his desk or work area, having to check things several times, hating dirt or dirty things, and having a special number that he counts up to or doing things just that number of times. He endorsed the following as "Yes, this stops me from doing other things or wastes some of my time": spending more time than needed to check his work, having thoughts or words repeat themselves over and over in his mind, having to do things over a certain number of times before it is just right, feeling guilty over minor infractions, and finding himself counting things or having numbers frequently go through his mind. He endorsed the following as "Yes, this stops me from doing a lot of things and wastes a lot of my time": having trouble making up his mind, and eating the same foods.  Personality Assessment Inventory (PAI): The PAI is an objective inventory of adult personality. The validity indicators suggested Mr. Conboy' profile is interpretable (ICN T = 49, INF T = 51, NIM T = 55, and PIM T = 25). He endorsed some concerns about health functioning (SOM T = 61 and SOM-H T = 59) that include impairments in perception or motor problems (SOM-C T = 60) and frequent occurrence of routine physical complaints (e.g., headaches, back problems, and/or gastrointestinal ailments; SOM-S T = 59) He also endorsed  significant anxiety and tension (ANX T = 74) and likely meeting full criteria for major depressive disorder (DEP T = 84 and BOR-A T = 78) that includes ruminative worry that results in impaired concentration (ANX-C T = 80 and DEP-C T = 72), difficulty relaxing and fatigue (ANX-A T = 73), anhedonia (DEP-A T = 91), overt physical signs of tension (e.g., sweaty palms, trembling hands, complaints of irregular heartbeats, and/or shortness of breath; ANX-P T = 61), vegetative signs of depression (e.g., appetite issues, sleep problems, and/or lack of drive; DEP-P T = 72) that may be at least partially attributable to a past traumatic event(s) that continue to be a source of significant distress (ARD-T T = 84). He noted experiencing a somewhat higher activity level than usual (MAN-A T = 60). He indicated experiencing impatience and being easily frustrated (MAN-I T = 76, AGG-A T = 64) that likely leaves him prone to verbal aggression (AGG-V T = 68) and contributes to relational strains (MAN-I T = 76) as well as being somewhat distant in relationships with others (PAR-H T = 60, SCZ-S T = 66, and WRM T = 30) and having difficulties with authority and social convention (ANT-A T = 73). He noted uncertainty about major life issues and challenges in maintaining a sense of purpose (BOR-I T = 71). He acknowledges significant difficulties  in functioning and perceives that help is needed in dealing with them (RXR T = 25).    SUMMARY AND CLINICAL IMPRESSIONS: Mr. Dhairya Bodart is a 43 year old male who Dr. Roel Clarity referred for an evaluation to determine if he currently meets criteria for a diagnosis of Attention-Deficit/Hyperactivity Disorder (ADHD).   Mr. Norena reported he and his wife have become concerned that he has ADHD as they have learned more about "neurodivergency." He expressed a belief that his ADHD-related symptoms have been occurring since childhood, are independent of mood, and consistent across  situations, but that the symptoms have "gotten worse or [he has become] more aware of them" in recent years.   Mr. Picazo was administered assessments during the evaluation to measure his current cognitive abilities. His verbal comprehension abilities were in the very high range compared to same-aged peers. His performance on the subtests comprising this index was comparable, which suggests his verbal reasoning abilities are similarly developed. His ability to sustain attention, concentrate, and exert mental control was in the average range, which indicates a relative weakness in attention, concentration, mental control, and shorter-term auditory memory versus his verbal reasoning abilities. Moreover, his performance on the subtests was diverse, with his best performance being on the Running Digits subtest, and his lowest performance was on Digit Sequencing. This significant discrepancy may indicate he has stronger maintenance, refreshing, and/or updating skills relative to mental manipulation of the material. Results of the CNS Vital Signs indicated an average neurocognitive processing ability. He demonstrated impairment in reaction time and simple attention; weakness in verbal memory and complex attention; and strengths in visual memory, psychomotor speed, and motor speed.  During the clinical interview and on self-report measures, Mr. Gorzynski endorsed executive functioning concerns that include attentional dysregulation, hyperactivity- and impulsivity-related symptoms, and meeting the full criteria for ADHD. Moreover, his spouse, Ms. Amanda Malugin, also indicated he is experiencing multiple executive functioning issues, although at an overall lower extent than Mr. Mcglade perceives himself as experiencing. However, the Inconsistency validity indicator was elevated for her results. While invalid test results make interpretation difficult, when considering self-reported symptoms as well as endorsed and/or  demonstrated impairment or weakness on measures of attention and executive functioning, a diagnosis of F90.2 Attention-Deficit/Hyperactivity Disorder, Combined Presentation, Moderate appears warranted. The specifier of "Moderate" as he endorsed symptoms over what is needed to make the diagnosis and indicated they cause impairment in his academic (e.g., a tendency to complete class assignments "last minute," trouble sustaining his attention during class, and struggling to adjust to the reduced structure of college), social (e.g., frequently engaging in excessive talking and interrupting of others), and daily (e.g., regularly experiencing being easily distracted, task initiation and completion issues, disorganization, and forgetfulness) functioning.   Mr. Passon also endorsed a history of autism spectrum disorder-related symptomatology; depressive episodes; generalized anxiety and "health anxiety;" social anxiety-related symptoms; obsessions and compulsions; sleep onset and maintenance issues as well as generally not feeling rested upon waking; fluctuating between "forget[ting] to eat all day" and then "overeat[ing] because [he became aware he is] hungry," adding he is prone to eating until uncomfortably full and has occasional periods of emotional eating-related behaviors; an instance of a visual hallucination that he indicated may have occurred secondary to "heavy substance use;" a "couple of [traumatic] events" that occurred in childhood and 2020; and suicidal ideation as a "child" and during periods of substance use. As such, the PHQ-9, GAD-7, OCD-A, and PAI were administered. His results indicated moderate depression-related symptomatology, severe anxiety-related symptomatology, and  mild problems with OCD-related concerns. However, given the limited scope of this evaluation, it was not possible to determine if the full criteria for these disorders are met or if his diagnosis of ADHD better explains the  symptoms. As such, he would likely benefit from further evaluation of these symptoms to definitively rule in or out autism spectrum disorder, depressive disorder, anxiety disorder(s), OCD, sleep-wake disorder, and eating disorder. Should any of the aforementioned be ruled in, they would likely be in addition to his diagnosis of ADHD, as he described his ADHD-related concerns as occurring before some of the concerns, as consistent across situations, and independent of mood. Moreover, he shared a belief that ADHD symptoms seemed to be occurring prior to trauma (e.g., he remembered being prone to drawing during church services), and he endorsed symptoms less commonly associated with trauma (e.g., habitual fidgeting as well as regularly engaging in excessive talking and interrupting of others).  DSM-5 Diagnostic Impressions: F90.2 Attention-Deficit/Hyperactivity Disorder, Combined Presentation, Moderate  RECOMMENDATIONS: Mr. Gonzalezlopez would likely benefit from making use of strategies for ADHD symptoms:  Setting a timer to complete tasks. Break tasks into manageable chunks and spread them out over more extended periods with breaks.  Utilizing lists and day calendars to keep track of tasks.  Answering emails daily.  Improve listening skills by asking the speaker to give information in smaller chunks and asking for explanations and clarification as needed. Leaving more than the anticipated time to complete tasks. It may help to keep tasks brief, well within your attention span, and a mix of both high and low-interest tasks. Tasks may be gradually increased in length. Practice proactive planning by setting aside time every evening to plan for the next day (e.g., prepare needed materials or pack the car the night before).  Learn how to make a practical and reasonable "to-do" list of important tasks and priorities and always keep it easily accessible. Make additional copies in case it is lost or  misplaced. Utilize visual reminders by posting appointments, "to-do lists," or schedules in strategic areas at home and work.  Practice using an appointment book, smartphone, or other tech device, or a daily planning calendar, and learn to write down appointments and commitments immediately. Keep notepads or use a portable audio recorder to capture important ideas that would be beneficial to recall later. Learn and practice time management skills. Purchase a programmable alarm watch or set an alarm on a smartphone to avoid losing track of time.  Use a color-coded file system, desk and closet organizers, storage boxes, or other organization devices to reduce clutter and improve efficiency and structure.  Implement ways to become more aware of your actions and to inhibit or adjust them as warranted (e.g., reviewing videos of your actions, considering consequences of obeying or not obeying the rules of various upcoming situations, having a trusted other to discuss plans with, and/or provide cues to stop certain behaviors, and make visual cues for rules you would like to follow). The 4Rs: Read just one paragraph, recite out loud in a soft voice or whisper what was important in that material, write that material down in a notebook, then review what you just wrote. Stay flexible and be prepared to change your plans, as symptom breakthroughs and crises will likely occur periodically. Mr. Hopf may benefit from mindfulness training to address symptoms of inattention.  Mr. Wilks would likely benefit from a consultation regarding medication for ADHD symptoms.   Individual therapeutic services may assist in processing a diagnosis  of ADHD and discussing coping and compensatory strategies. Mental alertness/energy can be raised by increasing exercise; improving sleep; eating a healthy diet; and managing stress. Consulting with a physician regarding any changes to the physical regimen is recommended. "Failing at  Normal: An ADHD Success Story" by Beulah Brunt is a great overview of ADHD. Dr. Magdalena Scholz also has a YouTube channel called, "Magdalena Scholz, PhD - Dedicated to ADHD Science+" with helpful videos on ADHD-related topics: [email or web address]  Applications:  RescueTime. Tracks your activities on your phone and/or computer to determine how productive you have been and what distracted you. Free two-week trial.  [email or web address] . It uses engineered audio that may reduce distractions and assist with focus. Free 15-day trial. Freedom. Allows you to highlight days and times you want to block yourself from certain sites or apps. Free trial. [employer].  It allows you to input your bank accounts and creates a visual layout of information about your financial goals, budget management, alerts, etc. May offer a free trial. Boomerang. It allows you to schedule times an email is sent and to see if others have received or opened your email. Ten messages free per month and a free trial of the premium version. IFTTT. Uses "channels" to create various actions (e.g., if you are mentioned in an email, highlight it in your inbox, and if you miss a call, add it to a to-do list). Free and premium versions. Unroll.me. Cleans up your email by unsubscribing from what you do not want to receive while still getting everything you do. Free. Finish. It allows you to divide two-list tasks into short-term, mid-term, and long-term tasks and determine how much time is left for a task. Focus mode hides non-priority tasks.  Autosilent. Turns your phone ringer on and off based on specified calendars, geo-fences, timers, etc. $3.99. Freakyalarm. It makes you solve math problems to disable an alarm. $1.99. Wake N Shake. It makes you vigorously shake your phone to stop the alarm. $.99. Todoist. It allows you to add sub-tasks to tasks and includes email and web plugins to make it work across the system. Premium has  location-based reminders, calendar sync, productive tracking, etc.  Sleep Cycle. Utilize your phone's motion sensors to catch movement while you are asleep. The alarm will wake you as early as 30 minutes before your alarm based on your lightest sleep phase and show you how daily activities affect your sleep quality.  Books: "Taking Charge of Adult ADHD Second Edition" by Dr. Magdalena Scholz "The ADHD Effect on Marriage" by Easter Golden "The Couples Guide to Thriving with ADHD" by Easter Golden Organizations that are a reliable source of information on ADHD:  Children and Adults with Attention-Deficit/Hyperactivity Disorder (CHADD): chadd.org  Attention Deficit Disorder Association (ADDA): HotterNames.de ADD Resources: addresources.org ADD WareHouse: addwarehouse.com World Federation of ADHD: adhd-federation.org ADDConsults: FightListings.se. Compilation of ADHD resources: https://www.harrell.com/ Future evaluation, if deemed necessary, and/or to determine the effectiveness of recommended interventions.   Maryetta Sneddon, Psy.D. Licensed Psychologist - [facility]-P [phone]   References  Arvil Birks, R. A. (2021). Taking charge of adult ADHD: proven strategies to succeed at work, at   home, and in relationships (pp. 6-10 and 272-276). [employer].        Veronica Gordon, PsyD

## 2023-11-26 ENCOUNTER — Ambulatory Visit: Admitting: Family Medicine

## 2023-11-26 ENCOUNTER — Encounter: Payer: Self-pay | Admitting: Family Medicine

## 2023-11-26 ENCOUNTER — Ambulatory Visit: Payer: Self-pay | Admitting: Family Medicine

## 2023-11-26 VITALS — BP 114/68 | HR 88 | Temp 97.3°F | Ht 68.0 in | Wt 150.4 lb

## 2023-11-26 DIAGNOSIS — R739 Hyperglycemia, unspecified: Secondary | ICD-10-CM | POA: Diagnosis not present

## 2023-11-26 DIAGNOSIS — K760 Fatty (change of) liver, not elsewhere classified: Secondary | ICD-10-CM | POA: Diagnosis not present

## 2023-11-26 DIAGNOSIS — D696 Thrombocytopenia, unspecified: Secondary | ICD-10-CM | POA: Diagnosis not present

## 2023-11-26 DIAGNOSIS — F909 Attention-deficit hyperactivity disorder, unspecified type: Secondary | ICD-10-CM | POA: Insufficient documentation

## 2023-11-26 LAB — CBC
HCT: 49.3 % (ref 39.0–52.0)
Hemoglobin: 16.3 g/dL (ref 13.0–17.0)
MCHC: 33.1 g/dL (ref 30.0–36.0)
MCV: 84.2 fl (ref 78.0–100.0)
Platelets: 145 10*3/uL — ABNORMAL LOW (ref 150.0–400.0)
RBC: 5.85 Mil/uL — ABNORMAL HIGH (ref 4.22–5.81)
RDW: 13.2 % (ref 11.5–15.5)
WBC: 5.2 10*3/uL (ref 4.0–10.5)

## 2023-11-26 LAB — COMPREHENSIVE METABOLIC PANEL WITH GFR
ALT: 21 U/L (ref 0–53)
AST: 21 U/L (ref 0–37)
Albumin: 4.6 g/dL (ref 3.5–5.2)
Alkaline Phosphatase: 49 U/L (ref 39–117)
BUN: 12 mg/dL (ref 6–23)
CO2: 31 meq/L (ref 19–32)
Calcium: 9.4 mg/dL (ref 8.4–10.5)
Chloride: 102 meq/L (ref 96–112)
Creatinine, Ser: 0.88 mg/dL (ref 0.40–1.50)
GFR: 104.82 mL/min (ref >60.00)
Glucose, Bld: 97 mg/dL (ref 70–99)
Potassium: 4.4 meq/L (ref 3.5–5.1)
Sodium: 138 meq/L (ref 135–145)
Total Bilirubin: 0.5 mg/dL (ref 0.2–1.2)
Total Protein: 7 g/dL (ref 6.0–8.3)

## 2023-11-26 NOTE — Assessment & Plan Note (Signed)
 I will check a CBC for monitoring of this.

## 2023-11-26 NOTE — Assessment & Plan Note (Signed)
I will reassess his blood sugar today.
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
## 2023-11-26 NOTE — Assessment & Plan Note (Signed)
 I will monitor liver enzymes

## 2023-12-12 ENCOUNTER — Encounter: Admitting: Family Medicine

## 2023-12-23 ENCOUNTER — Other Ambulatory Visit: Payer: Self-pay | Admitting: Family Medicine

## 2023-12-23 DIAGNOSIS — G4700 Insomnia, unspecified: Secondary | ICD-10-CM

## 2023-12-24 DIAGNOSIS — F902 Attention-deficit hyperactivity disorder, combined type: Secondary | ICD-10-CM | POA: Diagnosis not present

## 2023-12-24 DIAGNOSIS — F411 Generalized anxiety disorder: Secondary | ICD-10-CM | POA: Diagnosis not present

## 2023-12-24 DIAGNOSIS — F331 Major depressive disorder, recurrent, moderate: Secondary | ICD-10-CM | POA: Diagnosis not present

## 2024-01-07 ENCOUNTER — Encounter: Payer: Self-pay | Admitting: Family Medicine

## 2024-01-07 ENCOUNTER — Ambulatory Visit (INDEPENDENT_AMBULATORY_CARE_PROVIDER_SITE_OTHER): Payer: Self-pay | Admitting: Family Medicine

## 2024-01-07 VITALS — BP 122/74 | HR 90 | Temp 97.1°F | Ht 68.0 in | Wt 145.8 lb

## 2024-01-07 DIAGNOSIS — Z0000 Encounter for general adult medical examination without abnormal findings: Secondary | ICD-10-CM

## 2024-01-07 DIAGNOSIS — K219 Gastro-esophageal reflux disease without esophagitis: Secondary | ICD-10-CM | POA: Diagnosis not present

## 2024-01-07 DIAGNOSIS — F909 Attention-deficit hyperactivity disorder, unspecified type: Secondary | ICD-10-CM

## 2024-01-07 DIAGNOSIS — Z3009 Encounter for other general counseling and advice on contraception: Secondary | ICD-10-CM | POA: Diagnosis not present

## 2024-01-07 NOTE — Assessment & Plan Note (Signed)
 Following with behavioral health. Continue atomoxetine.

## 2024-01-07 NOTE — Assessment & Plan Note (Signed)
 We discussed potential approaches to treating this. I recommend he consider using an H2 blocker (Pepcid ) for symptom management.
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
## 2024-01-07 NOTE — Assessment & Plan Note (Signed)
 Overall health is excellent. Recommend continued regular exercise. Discussed recommended screenings and immunizations.

## 2024-01-27 ENCOUNTER — Encounter: Payer: Self-pay | Admitting: Family Medicine

## 2024-01-27 DIAGNOSIS — F418 Other specified anxiety disorders: Secondary | ICD-10-CM

## 2024-01-27 DIAGNOSIS — F909 Attention-deficit hyperactivity disorder, unspecified type: Secondary | ICD-10-CM

## 2024-01-27 DIAGNOSIS — G4700 Insomnia, unspecified: Secondary | ICD-10-CM

## 2024-01-28 MED ORDER — ATOMOXETINE HCL 80 MG PO CAPS: 80.0000 mg | ORAL_CAPSULE | Freq: Every day | ORAL | 5 refills | Status: DC

## 2024-01-28 MED ORDER — TRAZODONE HCL 50 MG PO TABS: ORAL_TABLET | ORAL | 3 refills | Status: AC

## 2024-01-28 MED ORDER — GABAPENTIN 100 MG PO CAPS: 100.0000 mg | ORAL_CAPSULE | Freq: Two times a day (BID) | ORAL | 3 refills | Status: AC

## 2024-02-05 ENCOUNTER — Ambulatory Visit: Admitting: Urology

## 2024-02-21 ENCOUNTER — Other Ambulatory Visit: Payer: Self-pay | Admitting: Family Medicine

## 2024-02-21 DIAGNOSIS — F909 Attention-deficit hyperactivity disorder, unspecified type: Secondary | ICD-10-CM

## 2024-03-09 ENCOUNTER — Encounter: Payer: Self-pay | Admitting: Internal Medicine

## 2024-03-09 ENCOUNTER — Ambulatory Visit: Admitting: Internal Medicine

## 2024-03-09 ENCOUNTER — Ambulatory Visit: Payer: Self-pay

## 2024-03-09 VITALS — BP 112/72 | HR 56 | Ht 68.0 in | Wt 139.0 lb

## 2024-03-09 DIAGNOSIS — K5909 Other constipation: Secondary | ICD-10-CM

## 2024-03-09 NOTE — Patient Instructions (Addendum)
 CONSTIPATION: Dietary changes (more leafy greens, vegetables and fruits; less processed foods) Decrease intake of foods that may cause constipation such as dairy (milk, cheeses), processed meats, and white bread.  Drink at least 6-8 cups of water per day Ensure regular exercise, this includes walking 30 minutes a day      Stool Softener : Colace (Docusate Sodium) 100mg  . Take 1 tablet 2 times a day  Begin Miralax 1  capful two times daily (for example: 8am and 4pm) for two days. Decrease to 1 capful daily after this.  Stools should reach consistency of toothpaste. Once this has been achieved, use the Colace as needed for hardened stool.     A Maintenance regimen for chronic constipation is using Miralax 1 capful daily and Colace 100mg  twice daily.     If no improvement in constipation despite taking Miralax and Colace , can try over the counter Magnesium  Citrate Liquid.

## 2024-03-09 NOTE — Telephone Encounter (Signed)
 Noted PT has appointment scheduled for 03/09/24 @ 3:20
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
## 2024-03-09 NOTE — Telephone Encounter (Signed)
 FYI Only or Action Required?: FYI only for provider.  Patient was last seen in primary care on 01/07/2024 by Thedora Garnette HERO, MD.  Called Nurse Triage reporting Constipation.  Symptoms began several weeks ago.  Interventions attempted: OTC medications: laxatives.  Symptoms are: unchanged.  Triage Disposition: See Physician Within 24 Hours  Patient/caregiver understands and will follow disposition?: Yes        Copied from CRM #[id number]. Topic: Clinical - Red Word Triage >> Mar 09, 2024 11:26 AM Rosina BIRCH wrote: Reason for CRM: constipation for a couple weeks and has took depositories and oral medicaton and it is helped but it has not solved the issue Reason for Disposition  [1] MILD abdominal pain (e.g., does not interfere with normal activities) AND [2] pain comes and goes (cramps) AND [3] fever  Answer Assessment - Initial Assessment Questions 1. STOOL PATTERN OR FREQUENCY: How often do you have a bowel movement (BM)?  (Normal range: 3 times a day to every 3 days)  When was your last BM?       Prior to this daily 2. STRAINING: Do you have to strain to have a BM?      Yes, just a bit 3. ONSET: When did the constipation begin?     Two weeks ago 4. RECTAL PAIN: Does your rectum hurt when the stool comes out? If Yes, ask: Do you have hemorrhoids? How bad is the pain?  (Scale 1-10; or mild, moderate, severe)     Hx IBS, discomfort pain in abd 5. BM COMPOSITION: Are the stools hard?      denies 6. BLOOD ON STOOLS: Has there been any blood on the toilet tissue or on the surface of the BM? If Yes, ask: When was the last time?     Yes, in the last couple of days, bright red 7. CHRONIC CONSTIPATION: Is this a new problem for you?  If No, ask: How long have you had this problem? (days, weeks, months)      Hx IBS, yes, has had this issue before 8. CHANGES IN DIET OR HYDRATION: Have there been any recent changes in your diet? How much fluids are you drinking on a  daily basis?  How much have you had to drink today?     denies 9. MEDICINES: Have you been taking any new medicines? Are you taking any narcotic pain medicines? (e.g., Dilaudid, morphine, Percocet, Vicodin)     na 10. LAXATIVES: Have you been using any stool softeners, laxatives, or enemas?  If Yes, ask What are you using, how often, and when was the last time?       yes 11. ACTIVITY:  How much walking do you do every day?  Has your activity level decreased in the past week?        Has gone down 12. CAUSE: What do you think is causing the constipation?        ibs 13. MEDICAL HISTORY: Do you have a history of hemorrhoids, rectal fissures, rectal surgery, or rectal abscess?         denies 14. OTHER SYMPTOMS: Do you have any other symptoms? (e.g., abdomen pain, bloating, fever, vomiting)       Abd discomfort 15. PREGNANCY: Is there any chance you are pregnant? When was your last menstrual period?       na  Protocols used: Constipation-A-AH

## 2024-03-17 ENCOUNTER — Ambulatory Visit: Admitting: Urology

## 2024-03-17 ENCOUNTER — Telehealth: Payer: Self-pay | Admitting: Urology

## 2024-03-17 ENCOUNTER — Encounter: Payer: Self-pay | Admitting: Urology

## 2024-03-17 VITALS — BP 108/76 | HR 97 | Ht 68.0 in | Wt 138.0 lb

## 2024-03-17 DIAGNOSIS — Z3009 Encounter for other general counseling and advice on contraception: Secondary | ICD-10-CM

## 2024-03-17 MED ORDER — ALPRAZOLAM 1 MG PO TABS: ORAL_TABLET | ORAL | 0 refills | Status: AC

## 2024-03-17 NOTE — Telephone Encounter (Signed)
 Pt is going to speak to his wife and then call to schedule [name] 03/17/24

## 2024-03-17 NOTE — Progress Notes (Signed)
 Assessment: 1. Encounter for vasectomy assessment      Plan: Schedule for vasectomy per patient request Rx for alprazolam  1 mg pre-procedure provided.   Chief Complaint:  Chief Complaint  Patient presents with   VAS Consult    History of Present Illness:  Tony Jennings is a 43 y.o. male who is seen for vasectomy evaluation. He is married with no children.  No history of scrotal trauma or infection.  Past Medical History:  Past Medical History:  Diagnosis Date   Acute hypokalemia    Acute hyponatremia    ADHD    Alcohol  abuse    Alcohol -induced chronic pancreatitis (HCC)    ALD (alcoholic liver disease) (HCC)    Anemia 04/05/2017   Anxiety    Depression    GERD (gastroesophageal reflux disease) 07/17/2021   Hepatic steatosis    Preventative health care 03/22/2019   Seizures (HCC)    Substance abuse (HCC)    Ulnar neuropathy at elbow 10/24/2014    Past Surgical History:  Past Surgical History:  Procedure Laterality Date   WRIST SURGERY      Allergies:  Allergies  Allergen Reactions   Citalopram Other (See Comments)    Tremors    Family History:  Family History  Problem Relation Age of Onset   Cancer Mother        Skin   Hypertension Mother    Hyperlipidemia Father    Hypertension Father    Cancer Maternal Grandmother        Colon   Stroke Maternal Grandmother    Stroke Maternal Grandfather    Heart disease Maternal Grandfather    Heart disease Paternal Grandmother     Social History:  Social History   Tobacco Use   Smoking status: Former    Current packs/day: 0.00    Average packs/day: 0.3 packs/day for 10.0 years (2.5 ttl pk-yrs)    Types: Cigarettes    Start date: 07/17/2009    Quit date: 07/18/2019    Years since quitting: 4.6   Smokeless tobacco: Never  Vaping Use   Vaping status: Never Used  Substance Use Topics   Alcohol  use: No    Comment: prior alcohol  abuse   Drug use: No    Comment: prior use marijuana, snorted  ritalin, rare cocain. (But this was when 43 yo)    Review of symptoms:  Constitutional:  Negative for unexplained weight loss, night sweats, fever, chills ENT:  Negative for nose bleeds, sinus pain, painful swallowing CV:  Negative for chest pain, shortness of breath, exercise intolerance, palpitations, loss of consciousness Resp:  Negative for cough, wheezing, shortness of breath GI:  Negative for nausea, vomiting, diarrhea, bloody stools GU:  Positives noted in HPI; otherwise negative for gross hematuria, dysuria, urinary incontinence Neuro:  Negative for seizures, poor balance, limb weakness, slurred speech Psych:  Negative for lack of energy, depression, anxiety Endocrine:  Negative for polydipsia, polyuria, symptoms of hypoglycemia (dizziness, hunger, sweating) Hematologic:  Negative for anemia, purpura, petechia, prolonged or excessive bleeding, use of anticoagulants  Allergic:  Negative for difficulty breathing or choking as a result of exposure to anything; no shellfish allergy; no allergic response (rash/itch) to materials, foods  Physical exam: BP 108/76   Pulse 97   Ht 5' 8 (1.727 m)   Wt 138 lb (62.6 kg)   BMI 20.98 kg/m  GENERAL APPEARANCE:  Well appearing, well developed, well nourished, NAD HEENT:  Atraumatic, normocephalic, oropharynx clear NECK:  Supple without lymphadenopathy or  thyromegaly ABDOMEN:  Soft, non-tender, no masses EXTREMITIES:  Moves all extremities well, without clubbing, cyanosis, or edema NEUROLOGIC:  Alert and oriented x 3, normal gait, CN II-XII grossly intact MENTAL STATUS:  appropriate BACK:  Non-tender to palpation, No CVAT SKIN:  Warm, dry, and intact GU: Penis:  circumcised Meatus: Normal Scrotum: Vas palpated bilaterally Testis: normal without masses bilateral  Results: None  VASECTOMY CONSULTATION  Tony Jennings presents for vasectomy consultation today.  He is a 43 y.o. male, Married with no children.  He and his wife have  discussed the issues regarding long-term fertility and are comfortable with this decision.  He presents for consideration for vasectomy.  I discussed the issues in detail with him today and he expressed no reservations.  As to the procedure, no scalpel technique vasectomy is explained and reviewed in detail.  Generalized risks including but not limited to bleeding, infection, orchalgia, testicular atrophy, epididymitis, scrotal hematoma, and chronic pain are discussed.   Additionally, he understands that the possibility of vas recanalization following vasectomy is possible although rare.  Most importantly, the patient understands that he is not sterile initially and will need a semen analysis check to confirm sterility such that no sperm are seen.  He is advised to avoid ejaculation for 10 days following the procedure.  The initial semen analysis will be checked in approximately 12 weeks and in some patients, several months may be required for clearance of all sperm.  He reports a clear understanding of the need for continued birth control until sterility is confirmed.  Otherwise, general issues regarding local anesthesia, prep, alprazolam  are discussed and he reports a clear understanding.

## 2024-03-17 NOTE — Patient Instructions (Signed)
 Vasectomy is a safe, simple, and effective office based procedure that provides men with permanent sterility.  The no scalpel technique has been a refinement but often results in less swelling and pain than the traditional vasectomy method.  Prior to a vasectomy, it is important to make a decision that you are interested in permanent sterility and that you and your partner must be completely sure that you do not want children in the future.  To prepare for the procedure, stop taking any aspirin or blood thinners for 1 week prior to the procedure.  The day of your procedure take a shower and thoroughly clean your scrotum.  It is not necessary to shave prior to the procedure as any shaving that is needed will be performed in the office.  Bring a pair of tight underwear such as briefs, boxer briefs, or athletic shorts with you for the day of the procedure.  You may eat a light meal prior to the procedure.  During the procedure, the scrotal skin will be sterilized completely.  Anesthesia will be provided by injection of a local anesthetic into the scrotum which will provide anesthesia for 2-3 hours after the procedure.  Once the local anesthetic takes effect, a tiny puncture is made in the middle of the front of the scrotum through which both vasa deferens tubes can be partially removed and the ends of the tubes cauterized.  A small absorbable suture is placed in the skin incision.  Antibiotic ointment and sterile gauze dressings are applied and are held in place with the undergarment.  Prescriptions for an antibiotic and pain medication will be sent to your pharmacy.  The antibiotic should be taken to completion.  The pain medication can be taken as needed every 4-6 hours.  If a narcotic pain medication is too strong, over-the-counter analgesics such as Tylenol or ibuprofen may be taken instead.  After the procedure, it is important to take it easy for a couple of days and apply the ice pack to the scrotal area.   The ice pack goes on top of the undergarment and should be used for 10-15 minutes at a time while you are awake.  After the first 2 days, you can gradually increase your activity.  During the first week, it is important to avoid strenuous activity or activity that puts pressure in the scrotal area.  It is also advisable to restrain from heavy lifting or long distance running during this time period.  Often, supportive underwear is helpful to reduce pain during the first week.  You should also avoid sexual activity for 10 days.  You can resume normal activities after 1 week and sexual relations in 10 days.  One of the most important considerations after vasectomy is that you are still fertile after the procedure.  It generally takes at least 20 ejaculations and 3 months time before you are considered sterile.  Even 3 months after the procedure, some men will have a few persistent sperm present.  To be considered sterile, you will need to produce a semen sample that shows no sperm.  You will receive instructions to bring in your first semen specimen to the office 3 months after the procedure.  It is very important that you continue to use your current method of birth control during this time as it is possible to achieve a pregnancy until you become sterile.  Potential complications of vasectomy include bleeding (less than 1% risk of serious bleeding), infection (less than 1%), reconnection of  the vas deferens (07/998 chance), pregnancy (07/1998 chance), shrinkage of the testicle (07/4998 chance), and chronic pain (2-4%).  Other potential consequences of vasectomy include sperm granuloma (a small round area of scar tissue in the area of the procedure is performed), and congestion of the epididymis (a fullness of the tubes where sperm are stored).  Sperm will continue to be produced by the testicles, but the sperm will eventually die and be absorbed by the body.  The amount of semen that is produced will not change.   The main difference is that the semen will not contain sperm after a man has become sterile.  The procedure does not affect your urination, sex drive, or erections.  In summary, vasectomy provides permanent birth control for men.  It is an office-based procedure which is safe, effective, and economical.  After the procedure, it takes time to become sterile so proper precautions must be taken until sterility is achieved.    Taking care of yourself after a VASECTOMY                                              Patient Information Sheet        The following information will reinforce some of the instructions that your doctor has given you.  Day of Procedure: 1) Wear the scrotal supporter and gauze pad 2) Use an ice pack on the scrotum for 15 minutes every hour for 48 hours to help reduce discomfort, swelling and bruising (do NOT place ice directly on your skin, but place on top of the supporter) 3) Expect some clear to pinkish drainage at the surgical site for the first 24-48 hours 4) If needed, use pain medications provided or ibuprofen 800 mg every 8 hours for discomfort 5) Avoid strenuous activities like mowing, lifting, jogging and exercising for 1 week.  Take it easy! 6) If you develop a fever over 101 F or sudden onset of significant swelling within the first 12 hours, please call to report this to your doctor as soon as possible.     Day Two and Three: 1) You may take a shower, but avoid tubs, pools or hot tubs. 2) Continue to wear the scrotal supporter as needed for comfort and change or remove the gauze pad if desired 3) Keep taking it easy!  Avoid strenuous activities like mowing, lifting, jogging and exercising.   4) Continue to watch for signs or symptoms of fever or significant swelling 5) Apply a small amount of antibiotic ointment to incision 1-2 times/day  The rest of the week: 1) Gradually return to normal physical activities after one week.  A return of soreness might  mean you are        "doing too much too soon". 2) Avoid sexual activity for 10 days after the procedure 3) Continue to take a shower, but avoid tubs, pools or hot tubs 4) Wearing the scrotal supporter is optional based on your comfort.     Remember to use an alternate form of contraception for 3 months until you have been checked and CLEARED by your urologist!  62-Month lab appointment:  1) The lab technician will need to look at a semen sample under a microscope  2) Use the specimen cup provided to collect the sample AT HOME 1 hour before the appointment  3) DO NOT refrigerate the specimen, but keep  at room or body temperature  4) Avoid ejaculation for 2-5 days before collecting the specimen  5) Collect the entire specimen by masturbation using NO lubricant  6) Make sure your name, MR number, date and time of collection are on the cup

## 2024-04-07 ENCOUNTER — Telehealth: Payer: Self-pay | Admitting: Family Medicine

## 2024-04-07 NOTE — Telephone Encounter (Signed)
 error

## 2024-04-12 ENCOUNTER — Telehealth: Payer: Self-pay | Admitting: Family Medicine

## 2024-04-12 NOTE — Telephone Encounter (Signed)
 error

## 2024-04-29 ENCOUNTER — Telehealth: Payer: Self-pay | Admitting: Family Medicine

## 2024-04-29 NOTE — Telephone Encounter (Signed)
  error

## 2024-05-10 DIAGNOSIS — F902 Attention-deficit hyperactivity disorder, combined type: Secondary | ICD-10-CM | POA: Diagnosis not present

## 2024-05-10 DIAGNOSIS — F441 Dissociative fugue: Secondary | ICD-10-CM | POA: Diagnosis not present

## 2024-05-10 DIAGNOSIS — F331 Major depressive disorder, recurrent, moderate: Secondary | ICD-10-CM | POA: Diagnosis not present

## 2024-06-07 DIAGNOSIS — F331 Major depressive disorder, recurrent, moderate: Secondary | ICD-10-CM | POA: Diagnosis not present

## 2024-06-07 DIAGNOSIS — F902 Attention-deficit hyperactivity disorder, combined type: Secondary | ICD-10-CM | POA: Diagnosis not present

## 2024-06-07 DIAGNOSIS — F411 Generalized anxiety disorder: Secondary | ICD-10-CM | POA: Diagnosis not present

## 2024-07-29 ENCOUNTER — Ambulatory Visit: Payer: Self-pay | Admitting: *Deleted

## 2024-07-29 NOTE — Telephone Encounter (Signed)
" °  FYI Only or Action Required?: FYI only for provider: appointment scheduled on 1/23.  Patient was last seen in primary care on 03/09/2024 by Billy Knee, FNP.  Called Nurse Triage reporting Rectal Bleeding.  Symptoms began several days ago.  Interventions attempted: OTC medications: Miralax.  Symptoms are: stable.  Triage Disposition: See PCP When Office is Open (Within 3 Days)  Patient/caregiver understands and will follow disposition?: Yes   Message from Alexandria E sent at 07/29/2024  8:42 AM EST  Summary: Changes in stool / rectal bleeding   Reason for Triage: Changes in stool, rectal bleeding. Stool varies between constipation and diarrhea.         Reason for Disposition  MILD rectal bleeding (e.g., more than just a few drops or streaks)  Answer Assessment - Initial Assessment Questions 1. APPEARANCE of BLOOD: What color is it? Is it passed separately, on the surface of the stool, or mixed in with the stool?      Small amount of blood when wiped 2. AMOUNT: How much blood was passed?      Small amount 3. FREQUENCY: How many times has blood been passed with the stools?      Once-1-2 days ago 4. ONSET: When was the blood first seen in the stools? (Days or weeks)      Tuesday 5. DIARRHEA: Is there also some diarrhea? If Yes, ask: How many diarrhea stools in the past 24 hours?      no 6. CONSTIPATION: Do you have constipation? If Yes, ask: How bad is it?     Yes- taking Miralax-either constipated or soft stool- patient stopped the Miralax 1 week ago, stool has become firm- and patient noticed the bleeding 7. RECURRENT SYMPTOMS: Have you had blood in your stools before? If Yes, ask: When was the last time? and What happened that time?      Yes- 03/09/24, started Miralax 8. BLOOD THINNERS: Do you take any blood thinners? (e.g., aspirin, clopidogrel / Plavix, coumadin, heparin). Notes: Other strong blood thinners include: Arixtra (fondaparinux),  Eliquis (apixaban), Pradaxa (dabigatran), and Xarelto (rivaroxaban).     no 9. OTHER SYMPTOMS: Do you have any other symptoms?  (e.g., abdomen pain, vomiting, dizziness, fever)     Abdominal pain when unable to have BM  Protocols used: Rectal Bleeding-A-AH  "

## 2024-07-30 ENCOUNTER — Encounter: Payer: Self-pay | Admitting: Nurse Practitioner

## 2024-07-30 ENCOUNTER — Ambulatory Visit: Admitting: Nurse Practitioner

## 2024-07-30 VITALS — BP 102/74 | HR 84 | Temp 97.8°F | Ht 68.0 in | Wt 146.6 lb

## 2024-07-30 DIAGNOSIS — R198 Other specified symptoms and signs involving the digestive system and abdomen: Secondary | ICD-10-CM | POA: Diagnosis not present

## 2024-07-30 NOTE — Progress Notes (Signed)
 "  Acute Visit  BP 102/74 (BP Location: Left Arm, Patient Position: Sitting, Cuff Size: Normal)   Pulse 84   Temp 97.8 F (36.6 C)   Ht 5' 8 (1.727 m)   Wt 146 lb 9.6 oz (66.5 kg)   BMI 22.29 kg/m    Subjective:    Patient ID: Tony Jennings, male    DOB: 27-Sep-1980, 44 y.o.   MRN: [id number]  CC: Chief Complaint  Patient presents with   Constipation    Since September, rectal blood    HPI: Tony Jennings is a 44 y.o. male presents for constipation and rectal bleeding. He reported experiencing changes in his bowel habits, alternating between constipation and diarrhea for several year, but worsened last September. He saw a provider who discussed starting miralax daily which he was doing and symptoms had improved for the most part.   He had noticed a small amount of blood when he wiped on Tuesday, and has been having infrequent bowel movements for the last 2 weeks. He was using Miralax for management, but stopped 1 week ago due to running out. He is not having as much bleeding as he did in September. He denies abdominal pain, fever, dysuria.   Past Medical History:  Diagnosis Date   Acute hypokalemia    Acute hyponatremia    ADHD    Alcohol  abuse    Alcohol -induced chronic pancreatitis (HCC)    ALD (alcoholic liver disease)    Anemia 04/05/2017   Anxiety    Depression    GERD (gastroesophageal reflux disease) 07/17/2021   Hepatic steatosis    Preventative health care 03/22/2019   Seizures (HCC)    Substance abuse (HCC)    Ulnar neuropathy at elbow 10/24/2014    Past Surgical History:  Procedure Laterality Date   WRIST SURGERY      Family History  Problem Relation Age of Onset   Cancer Mother        Skin   Hypertension Mother    Hyperlipidemia Father    Hypertension Father    Cancer Maternal Grandmother        Colon   Stroke Maternal Grandmother    Stroke Maternal Grandfather    Heart disease Maternal Grandfather    Heart disease Paternal Grandmother       Social History[1]  Medications Ordered Prior to Encounter[2]   Review of Systems See pertinent positives and negatives per HPI.     Objective:    BP 102/74 (BP Location: Left Arm, Patient Position: Sitting, Cuff Size: Normal)   Pulse 84   Temp 97.8 F (36.6 C)   Ht 5' 8 (1.727 m)   Wt 146 lb 9.6 oz (66.5 kg)   BMI 22.29 kg/m   Wt Readings from Last 3 Encounters:  07/30/24 146 lb 9.6 oz (66.5 kg)  03/17/24 138 lb (62.6 kg)  03/09/24 139 lb (63 kg)    BP Readings from Last 3 Encounters:  07/30/24 102/74  03/17/24 108/76  03/09/24 112/72    Physical Exam Vitals and nursing note reviewed. Exam conducted with a chaperone present.  Constitutional:      Appearance: Normal appearance.  HENT:     Head: Normocephalic.  Eyes:     Conjunctiva/sclera: Conjunctivae normal.  Cardiovascular:     Rate and Rhythm: Normal rate and regular rhythm.     Pulses: Normal pulses.     Heart sounds: Normal heart sounds.  Pulmonary:     Effort: Pulmonary effort is normal.  Breath sounds: Normal breath sounds.  Abdominal:     Palpations: Abdomen is soft.     Tenderness: There is no abdominal tenderness.  Genitourinary:    Rectum: No anal fissure or external hemorrhoid.  Musculoskeletal:     Cervical back: Normal range of motion.  Skin:    General: Skin is warm.  Neurological:     General: No focal deficit present.     Mental Status: He is alert and oriented to person, place, and time.  Psychiatric:        Mood and Affect: Mood normal.        Behavior: Behavior normal.        Thought Content: Thought content normal.        Judgment: Judgment normal.        Assessment & Plan:   Problem List Items Addressed This Visit       Other   Alternating constipation and diarrhea - Primary   Chronic, ongoing. He has been out of miralax for the last 2 weeks. Recommend that he start this again daily. No external hemorrhoids or fissures noted on exam. Encourage fluids, regular  exercise. Information printed on nutrition with IBS. Referral placed to GI.       Relevant Orders   Ambulatory referral to Gastroenterology     Follow up plan: Return if symptoms worsen or fail to improve.  Tinnie DELENA Harada, NP  I,Emily Lagle,acting as a scribe for [employer], NP.,have documented all relevant documentation on the behalf of Akaisha Truman DELENA Harada, NP.  I, Tinnie DELENA Harada, NP, have reviewed all documentation for this visit. The documentation on 07/30/2024 for the exam, diagnosis, procedures, and orders are all accurate and complete.     [1]  Social History Tobacco Use   Smoking status: Former    Current packs/day: 0.00    Average packs/day: 0.3 packs/day for 10.0 years (2.5 ttl pk-yrs)    Types: Cigarettes    Start date: 07/17/2009    Quit date: 07/18/2019    Years since quitting: 5.0   Smokeless tobacco: Never  Vaping Use   Vaping status: Never Used  Substance Use Topics   Alcohol  use: No    Comment: prior alcohol  abuse   Drug use: No    Comment: prior use marijuana, snorted ritalin, rare cocain. (But this was when 44 yo)  [2]  Current Outpatient Medications on File Prior to Visit  Medication Sig Dispense Refill   atomoxetine  (STRATTERA ) 80 MG capsule TAKE 1 CAPSULE BY MOUTH EVERY DAY 90 capsule 2   escitalopram  (LEXAPRO ) 10 MG tablet Take 10 mg by mouth every morning.     gabapentin  (NEURONTIN ) 100 MG capsule Take 1 capsule (100 mg total) by mouth 2 (two) times daily. 180 capsule 3   Multiple Vitamin (MULTIVITAMIN WITH MINERALS) TABS tablet Take 1 tablet by mouth daily.     traZODone  (DESYREL ) 50 MG tablet Take 1 tablet by mouth at bedtime or as directed 90 tablet 3   ALPRAZolam  (XANAX ) 1 MG tablet Take 1 tablet by mouth 1 hour prior to procedure (Patient not taking: Reported on 07/30/2024) 1 tablet 0   [DISCONTINUED] buPROPion  (WELLBUTRIN  SR) 100 MG 12 hr tablet Take 1 tablet (100 mg total) by mouth daily. (Patient not taking: Reported on 02/04/2017) 30  tablet 0   [DISCONTINUED] propranolol  (INDERAL ) 10 MG tablet Take 0.5 tablets (5 mg total) by mouth 2 (two) times daily. 30 tablet 0   No current facility-administered medications on file  prior to visit.   "

## 2024-07-30 NOTE — Assessment & Plan Note (Signed)
 Chronic, ongoing. He has been out of miralax for the last 2 weeks. Recommend that he start this again daily. No external hemorrhoids or fissures noted on exam. Encourage fluids, regular exercise. Information printed on nutrition with IBS. Referral placed to GI.

## 2024-07-30 NOTE — Patient Instructions (Signed)
 It was great to see you!  I have placed a referral to GI  Re-start the miralax daily   Keep drinking plenty of water and get regular exercise   Let's follow-up if symptoms worsen or don't improve  Take care,  Tinnie Harada, NP

## 2024-09-28 ENCOUNTER — Encounter: Admitting: Urology

## 2025-01-07 ENCOUNTER — Encounter: Admitting: Family Medicine

## 2025-01-10 ENCOUNTER — Encounter: Admitting: Family Medicine
